# Patient Record
Sex: Female | Born: 1960 | Race: Black or African American | Hispanic: No | Marital: Married | Smoking: Current every day smoker
Health system: Southern US, Community
[De-identification: ages and names within clinical notes are randomized; demographics above are authoritative.]

## PROBLEM LIST (undated history)

## (undated) DIAGNOSIS — M545 Low back pain, unspecified: Secondary | ICD-10-CM

## (undated) DIAGNOSIS — R569 Unspecified convulsions: Secondary | ICD-10-CM

## (undated) DIAGNOSIS — F319 Bipolar disorder, unspecified: Secondary | ICD-10-CM

## (undated) DIAGNOSIS — M51369 Other intervertebral disc degeneration, lumbar region without mention of lumbar back pain or lower extremity pain: Secondary | ICD-10-CM

## (undated) DIAGNOSIS — G8384 Todd's paralysis (postepileptic): Secondary | ICD-10-CM

## (undated) DIAGNOSIS — R519 Headache, unspecified: Secondary | ICD-10-CM

## (undated) DIAGNOSIS — R262 Difficulty in walking, not elsewhere classified: Secondary | ICD-10-CM

## (undated) DIAGNOSIS — M5136 Other intervertebral disc degeneration, lumbar region: Secondary | ICD-10-CM

## (undated) DIAGNOSIS — F329 Major depressive disorder, single episode, unspecified: Secondary | ICD-10-CM

## (undated) DIAGNOSIS — R51 Headache: Secondary | ICD-10-CM

## (undated) DIAGNOSIS — I10 Essential (primary) hypertension: Secondary | ICD-10-CM

## (undated) DIAGNOSIS — F419 Anxiety disorder, unspecified: Secondary | ICD-10-CM

## (undated) DIAGNOSIS — G8929 Other chronic pain: Secondary | ICD-10-CM

## (undated) DIAGNOSIS — F32A Depression, unspecified: Secondary | ICD-10-CM

## (undated) HISTORY — DX: Anxiety disorder, unspecified: F41.9

## (undated) HISTORY — DX: Essential (primary) hypertension: I10

## (undated) HISTORY — DX: Other intervertebral disc degeneration, lumbar region: M51.36

## (undated) HISTORY — PX: TONSILLECTOMY: SUR1361

## (undated) HISTORY — DX: Difficulty in walking, not elsewhere classified: R26.2

## (undated) HISTORY — DX: Depression, unspecified: F32.A

## (undated) HISTORY — DX: Other intervertebral disc degeneration, lumbar region without mention of lumbar back pain or lower extremity pain: M51.369

## (undated) HISTORY — DX: Major depressive disorder, single episode, unspecified: F32.9

---

## 2012-07-23 ENCOUNTER — Ambulatory Visit (HOSPITAL_COMMUNITY)
Admission: RE | Admit: 2012-07-23 | Discharge: 2012-07-23 | Disposition: A | Discharge status: Home / Self Care | Payer: Disability Insurance | Source: Ambulatory Visit | Attending: Physical Medicine and Rehabilitation | Admitting: Physical Medicine and Rehabilitation

## 2012-07-23 ENCOUNTER — Other Ambulatory Visit (HOSPITAL_COMMUNITY): Payer: Self-pay | Admitting: *Deleted

## 2012-07-23 DIAGNOSIS — M545 Low back pain, unspecified: Secondary | ICD-10-CM | POA: Insufficient documentation

## 2015-12-07 ENCOUNTER — Other Ambulatory Visit: Payer: Self-pay | Admitting: *Deleted

## 2015-12-07 NOTE — Telephone Encounter (Signed)
Angel pt - xanax - have nurse phone to pharm [facility]

## 2015-12-08 MED ORDER — ALPRAZOLAM 0.5 MG PO TABS: 0.5000 mg | ORAL_TABLET | Freq: Two times a day (BID) | ORAL | 1 refills | Status: DC | PRN

## 2015-12-08 NOTE — Telephone Encounter (Signed)
rx called into pharmacy

## 2016-01-08 ENCOUNTER — Other Ambulatory Visit: Payer: Self-pay | Admitting: *Deleted

## 2016-02-01 ENCOUNTER — Other Ambulatory Visit: Payer: Self-pay | Admitting: *Deleted

## 2016-02-02 MED ORDER — ALPRAZOLAM 0.5 MG PO TABS: 0.5000 mg | ORAL_TABLET | Freq: Two times a day (BID) | ORAL | 0 refills | Status: DC | PRN

## 2016-02-20 ENCOUNTER — Telehealth: Payer: Self-pay | Admitting: Physician Assistant

## 2016-02-21 ENCOUNTER — Encounter: Payer: Self-pay | Admitting: Physician Assistant

## 2016-02-21 ENCOUNTER — Encounter (INDEPENDENT_AMBULATORY_CARE_PROVIDER_SITE_OTHER): Payer: Self-pay

## 2016-02-21 ENCOUNTER — Ambulatory Visit (INDEPENDENT_AMBULATORY_CARE_PROVIDER_SITE_OTHER): Payer: Medicare Other | Admitting: Physician Assistant

## 2016-02-21 VITALS — BP 131/85 | HR 103 | Temp 97.4°F | Ht 66.0 in | Wt 108.0 lb

## 2016-02-21 DIAGNOSIS — M5136 Other intervertebral disc degeneration, lumbar region: Secondary | ICD-10-CM | POA: Diagnosis not present

## 2016-02-21 DIAGNOSIS — F331 Major depressive disorder, recurrent, moderate: Secondary | ICD-10-CM | POA: Diagnosis not present

## 2016-02-21 DIAGNOSIS — I10 Essential (primary) hypertension: Secondary | ICD-10-CM | POA: Diagnosis not present

## 2016-02-21 MED ORDER — HYDROCODONE-ACETAMINOPHEN 10-325 MG PO TABS: 1.0000 | ORAL_TABLET | Freq: Four times a day (QID) | ORAL | 0 refills | Status: DC | PRN

## 2016-02-21 MED ORDER — VENLAFAXINE HCL ER 75 MG PO CP24: 75.0000 mg | ORAL_CAPSULE | Freq: Every day | ORAL | 2 refills | Status: DC

## 2016-02-21 NOTE — Progress Notes (Signed)
BP 131/85   Pulse (!) 103   Temp 97.4 F (36.3 C) (Oral)   Ht 5\' 6"  (1.676 m)   Wt 108 lb (49 kg)   BMI 17.43 kg/m    Subjective:    Patient ID: Betty Nelson, female    DOB: 05-02-1960, 55 y.o.   MRN: [id number]  Betty Nelson is a 55 y.o. female presenting on 02/21/2016 for Follow-up and Medication Refill  HPI Patient here to be established as new patient at [facility].  This patient is known to me from [facility]. This patient comes in for periodic recheck on medications and conditions. All medications are reviewed today. There are no reports of any problems with the medications. All of the medical conditions are reviewed and updated.  Lab work is reviewed and will be ordered as medically necessary. She has established permanent disability from her severe degenerative disc disease, osteoarthritis, depression and anxiety. She is trying to reduce her alcohol. At the most she will have 3 cans of beer in a day but usually tries to do less. She does not feel that the citalopram is helping her depression and anxiety anymore. She has taken this medicine for a very long time. She states that she has taken Prozac, Zoloft, Paxil, Lexapro in the past. She has not taken Effexor. We will switch her over at this time to this new medication. It will be sent to her pharmacy. We will also have her reduce her anxiety medicine to at most 2 per day of the Xanax 0.5 mg. She understands that we need to reduce this in order to have her continue her pain medication. She has a very difficult time getting transportation and getting to other locations. She lives locally here in [place].  Indication for chronic opioid: DDD lumbar Medication and dose: percocet 10/325 1-2 tab QID for severe pain # pills per month: 240 Last UDS date: 6 months Pain contract signed (Y/N): No, will perform in 3 months at next check Date narcotic database last reviewed (include red flags):  no   Past Medical History:  Diagnosis Date  . Anxiety   . DDD (degenerative disc disease), lumbar   . DDD (degenerative disc disease), lumbar   . Depression   . Disability of walking   . Hypertension     Relevant past medical, surgical, family and social history reviewed and updated as indicated. Interim medical history since our last visit reviewed. Allergies and medications reviewed and updated.   Data reviewed from any sources in EPIC.  Review of Systems  Constitutional: Positive for fatigue. Negative for activity change and fever.  HENT: Negative.   Eyes: Negative.   Respiratory: Negative.  Negative for cough and wheezing.   Cardiovascular: Negative.  Negative for chest pain and leg swelling.  Gastrointestinal: Negative.  Negative for abdominal pain.  Endocrine: Negative.   Genitourinary: Negative.  Negative for dysuria.  Musculoskeletal: Positive for arthralgias, back pain, gait problem and myalgias.  Skin: Negative.   Neurological: Positive for headaches.  Psychiatric/Behavioral: Positive for decreased concentration, dysphoric mood and sleep disturbance. Negative for hallucinations and suicidal ideas. The patient is nervous/anxious. The patient is not hyperactive.      Social History   Social History  . Marital status: Married    Spouse name: N/A  . Number of children: N/A  . Years of education: N/A   Occupational History  . Not on file.   Social History Main Topics  . Smoking  status: Current Every Day Smoker  . Smokeless tobacco: Never Used  . Alcohol use 1.8 - 3.6 oz/week    3 - 6 Cans of beer per week  . Drug use: No  . Sexual activity: Not on file   Other Topics Concern  . Not on file   Social History Narrative  . No narrative on file    History reviewed. No pertinent surgical history.  History reviewed. No pertinent family history.    Medication List       Accurate as of 02/21/16 12:07 PM. Always use your most recent med list.           ALPRAZolam 0.5 MG tablet Commonly known as:  XANAX Take 1 tablet (0.5 mg total) by mouth 2 (two) times daily as needed for anxiety. Work on lessening this dose, only use 2 daily at most.   HYDROcodone-acetaminophen 10-325 MG tablet Commonly known as:  NORCO Take 1-2 tablets by mouth every 6 (six) hours as needed.   HYDROcodone-acetaminophen 10-325 MG tablet Commonly known as:  NORCO Take 1 tablet by mouth every 6 (six) hours as needed.   HYDROcodone-acetaminophen 10-325 MG tablet Commonly known as:  NORCO Take 1 tablet by mouth every 6 (six) hours as needed.   lisinopril 20 MG tablet Commonly known as:  PRINIVIL,ZESTRIL Take 10 mg by mouth daily.   megestrol 40 MG tablet Commonly known as:  MEGACE Take 40 mg by mouth 2 (two) times daily.   venlafaxine XR 75 MG 24 hr capsule Commonly known as:  EFFEXOR XR Take 1-2 capsules (75-150 mg total) by mouth daily with breakfast. In one week increase to 2 tablets QAM          Objective:    BP 131/85   Pulse (!) 103   Temp 97.4 F (36.3 C) (Oral)   Ht 5\' 6"  (1.676 m)   Wt 108 lb (49 kg)   BMI 17.43 kg/m   Not on File Wt Readings from Last 3 Encounters:  02/21/16 108 lb (49 kg)    Physical Exam  Constitutional: She is oriented to person, place, and time. She appears well-developed and well-nourished.  HENT:  Head: Normocephalic and atraumatic.  Eyes: Conjunctivae and EOM are normal. Pupils are equal, round, and reactive to light.  Cardiovascular: Normal rate, regular rhythm, normal heart sounds and intact distal pulses.   Pulmonary/Chest: Effort normal and breath sounds normal.  Abdominal: Soft. Bowel sounds are normal.  Neurological: She is alert and oriented to person, place, and time. She has normal reflexes.  Skin: Skin is warm and dry. No rash noted.  Psychiatric: She has a normal mood and affect. Her behavior is normal. Judgment and thought content normal.  Nursing note and vitals reviewed.      Assessment &  Plan:   1. DDD (degenerative disc disease), lumbar - HYDROcodone-acetaminophen (NORCO) 10-325 MG tablet; Take 1-2 tablets by mouth every 6 (six) hours as needed.  Dispense: 120 tablet; Refill: 0 - HYDROcodone-acetaminophen (NORCO) 10-325 MG tablet; Take 1 tablet by mouth every 6 (six) hours as needed.  Dispense: 120 tablet; Refill: 0 - HYDROcodone-acetaminophen (NORCO) 10-325 MG tablet; Take 1 tablet by mouth every 6 (six) hours as needed.  Dispense: 120 tablet; Refill: 0  2. Essential hypertension  3. Moderate episode of recurrent major depressive disorder (HCC) - venlafaxine XR (EFFEXOR XR) 75 MG 24 hr capsule; Take 1-2 capsules (75-150 mg total) by mouth daily with breakfast. In one week increase to 2 tablets QAM  Dispense: 60 capsule; Refill: 2   Continue all other maintenance medications as listed above. Educational handout given for anxiety  Follow up plan: Return in about 3 months (around 05/23/2016) for recheck medication/PAIN.  Remus LofflerAngel S. Dailyn Reith PA-C [facility] [address]  [place], [place] [address] [phone]   02/21/2016, 12:07 PM

## 2016-02-21 NOTE — Patient Instructions (Signed)
Generalized Anxiety Disorder Generalized anxiety disorder (GAD) is a mental disorder. It interferes with life functions, including relationships, work, and school. GAD is different from normal anxiety, which everyone experiences at some point in their lives in response to specific life events and activities. Normal anxiety actually helps us prepare for and get through these life events and activities. Normal anxiety goes away after the event or activity is over.  GAD causes anxiety that is not necessarily related to specific events or activities. It also causes excess anxiety in proportion to specific events or activities. The anxiety associated with GAD is also difficult to control. GAD can vary from mild to severe. People with severe GAD can have intense waves of anxiety with physical symptoms (panic attacks).  SYMPTOMS The anxiety and worry associated with GAD are difficult to control. This anxiety and worry are related to many life events and activities and also occur more days than not for 6 months or longer. People with GAD also have three or more of the following symptoms (one or more in children):  Restlessness.   Fatigue.  Difficulty concentrating.   Irritability.  Muscle tension.  Difficulty sleeping or unsatisfying sleep. DIAGNOSIS GAD is diagnosed through an assessment by your health care provider. Your health care provider will ask you questions aboutyour mood,physical symptoms, and events in your life. Your health care provider may ask you about your medical history and use of alcohol or drugs, including prescription medicines. Your health care provider may also do a physical exam and blood tests. Certain medical conditions and the use of certain substances can cause symptoms similar to those associated with GAD. Your health care provider may refer you to a mental health specialist for further evaluation. TREATMENT The following therapies are usually used to treat GAD:    Medication. Antidepressant medication usually is prescribed for long-term daily control. Antianxiety medicines may be added in severe cases, especially when panic attacks occur.   Talk therapy (psychotherapy). Certain types of talk therapy can be helpful in treating GAD by providing support, education, and guidance. A form of talk therapy called cognitive behavioral therapy can teach you healthy ways to think about and react to daily life events and activities.  Stress managementtechniques. These include yoga, meditation, and exercise and can be very helpful when they are practiced regularly. A mental health specialist can help determine which treatment is best for you. Some people see improvement with one therapy. However, other people require a combination of therapies. This information is not intended to replace advice given to you by your health care provider. Make sure you discuss any questions you have with your health care provider. Document Released: 07/20/2012 Document Revised: 04/15/2014 Document Reviewed: 07/20/2012 Elsevier Interactive Patient Education  2017 Elsevier Inc.  

## 2016-03-21 ENCOUNTER — Other Ambulatory Visit: Payer: Self-pay | Admitting: *Deleted

## 2016-03-21 MED ORDER — ALPRAZOLAM 0.5 MG PO TABS: 0.5000 mg | ORAL_TABLET | Freq: Two times a day (BID) | ORAL | 2 refills | Status: DC | PRN

## 2016-03-21 MED ORDER — LISINOPRIL 20 MG PO TABS: 10.0000 mg | ORAL_TABLET | Freq: Every day | ORAL | 2 refills | Status: DC

## 2016-03-21 NOTE — Telephone Encounter (Signed)
Last filled 02/14/2016. If approved please route to pool and have nurse call into pharmacy

## 2016-03-22 ENCOUNTER — Telehealth: Payer: Self-pay | Admitting: Physician Assistant

## 2016-03-22 NOTE — Telephone Encounter (Signed)
Rx called in to pharmacy. 

## 2016-04-20 ENCOUNTER — Other Ambulatory Visit: Payer: Self-pay | Admitting: Physician Assistant

## 2016-04-20 DIAGNOSIS — F331 Major depressive disorder, recurrent, moderate: Secondary | ICD-10-CM

## 2016-05-20 ENCOUNTER — Ambulatory Visit (INDEPENDENT_AMBULATORY_CARE_PROVIDER_SITE_OTHER): Payer: Medicare Other | Admitting: Physician Assistant

## 2016-05-20 ENCOUNTER — Encounter: Payer: Self-pay | Admitting: Physician Assistant

## 2016-05-20 ENCOUNTER — Other Ambulatory Visit: Payer: Self-pay | Admitting: Physician Assistant

## 2016-05-20 VITALS — BP 151/83 | HR 109 | Temp 98.6°F | Ht 66.0 in | Wt 114.0 lb

## 2016-05-20 DIAGNOSIS — M5136 Other intervertebral disc degeneration, lumbar region: Secondary | ICD-10-CM | POA: Diagnosis not present

## 2016-05-20 DIAGNOSIS — F331 Major depressive disorder, recurrent, moderate: Secondary | ICD-10-CM | POA: Insufficient documentation

## 2016-05-20 DIAGNOSIS — I10 Essential (primary) hypertension: Secondary | ICD-10-CM | POA: Insufficient documentation

## 2016-05-20 MED ORDER — HYDROCODONE-ACETAMINOPHEN 10-325 MG PO TABS: 1.0000 | ORAL_TABLET | Freq: Four times a day (QID) | ORAL | 0 refills | Status: DC | PRN

## 2016-05-20 MED ORDER — ALPRAZOLAM 0.5 MG PO TABS
0.5000 mg | ORAL_TABLET | Freq: Two times a day (BID) | ORAL | 2 refills | Status: DC | PRN
Start: 2016-05-20 — End: 2016-08-20

## 2016-05-20 MED ORDER — LISINOPRIL 20 MG PO TABS: 10.0000 mg | ORAL_TABLET | Freq: Every day | ORAL | 3 refills | Status: DC

## 2016-05-20 MED ORDER — VENLAFAXINE HCL ER 150 MG PO CP24: 150.0000 mg | ORAL_CAPSULE | Freq: Every day | ORAL | 3 refills | Status: DC

## 2016-05-20 NOTE — Patient Instructions (Signed)
Major Depressive Disorder, Adult Major depressive disorder (MDD) is a mental health condition. It may also be called clinical depression or unipolar depression. MDD usually causes feelings of sadness, hopelessness, or helplessness. MDD can also cause physical symptoms. It can interfere with work, school, relationships, and other everyday activities. MDD may be mild, moderate, or severe. It may occur once (single episode major depressive disorder) or it may occur multiple times (recurrent major depressive disorder). What are the causes? The exact cause of this condition is not known. MDD is most likely caused by a combination of things, which may include:  Genetic factors. These are traits that are passed along from parent to child.  Individual factors. Your personality, your behavior, and the way you handle your thoughts and feelings may contribute to MDD. This includes personality traits and behaviors learned from others.  Physical factors, such as: ? Differences in the part of your brain that controls emotion. This part of your brain may be different than it is in people who do not have MDD. ? Long-term (chronic) medical or psychiatric illnesses.  Social factors. Traumatic experiences or major life changes may play a role in the development of MDD.  What increases the risk? This condition is more likely to develop in women. The following factors may also make you more likely to develop MDD:  A family history of depression.  Troubled family relationships.  Abnormally low levels of certain brain chemicals.  Traumatic events in childhood, especially abuse or the loss of a parent.  Being under a lot of stress, or long-term stress, especially from upsetting life experiences or losses.  A history of: ? Chronic physical illness. ? Other mental health disorders. ? Substance abuse.  Poor living conditions.  Experiencing social exclusion or discrimination on a regular basis.  What are  the signs or symptoms? The main symptoms of MDD typically include:  Constant depressed or irritable mood.  Loss of interest in things and activities.  MDD symptoms may also include:  Sleeping or eating too much or too little.  Unexplained weight change.  Fatigue or low energy.  Feelings of worthlessness or guilt.  Difficulty thinking clearly or making decisions.  Thoughts of suicide or of harming others.  Physical agitation or weakness.  Isolation.  Severe cases of MDD may also occur with other symptoms, such as:  Delusions or hallucinations, in which you imagine things that are not real (psychotic depression).  Low-level depression that lasts at least a year (chronic depression or persistent depressive disorder).  Extreme sadness and hopelessness (melancholic depression).  Trouble speaking and moving (catatonic depression).  How is this diagnosed? This condition may be diagnosed based on:  Your symptoms.  Your medical history, including your mental health history. This may involve tests to evaluate your mental health. You may be asked questions about your lifestyle, including any drug and alcohol use, and how long you have had symptoms of MDD.  A physical exam.  Blood tests to rule out other conditions.  You must have a depressed mood and at least four other MDD symptoms most of the day, nearly every day in the same 2-week timeframe before your health care provider can confirm a diagnosis of MDD. How is this treated? This condition is usually treated by mental health professionals, such as psychologists, psychiatrists, and clinical social workers. You may need more than one type of treatment. Treatment may include:  Psychotherapy. This is also called talk therapy or counseling. Types of psychotherapy include: ? Cognitive behavioral   therapy (CBT). This type of therapy teaches you to recognize unhealthy feelings, thoughts, and behaviors, and replace them with  positive thoughts and actions. ? Interpersonal therapy (IPT). This helps you to improve the way you relate to and communicate with others. ? Family therapy. This treatment includes members of your family.  Medicine to treat anxiety and depression, or to help you control certain emotions and behaviors.  Lifestyle changes, such as: ? Limiting alcohol and drug use. ? Exercising regularly. ? Getting plenty of sleep. ? Making healthy eating choices. ? Spending more time outdoors.  Treatments involving stimulation of the brain can be used in situations with extremely severe symptoms, or when medicine or other therapies do not work over time. These treatments include electroconvulsive therapy, transcranial magnetic stimulation, and vagal nerve stimulation. Follow these instructions at home: Activity  Return to your normal activities as told by your health care provider.  Exercise regularly and spend time outdoors as told by your health care provider. General instructions  Take over-the-counter and prescription medicines only as told by your health care provider.  Do not drink alcohol. If you drink alcohol, limit your alcohol intake to no more than 1 drink a day for nonpregnant women and 2 drinks a day for men. One drink equals 12 oz of beer, 5 oz of wine, or 1 oz of hard liquor. Alcohol can affect any antidepressant medicines you are taking. Talk to your health care provider about your alcohol use.  Eat a healthy diet and get plenty of sleep.  Find activities that you enjoy doing, and make time to do them.  Consider joining a support group. Your health care provider may be able to recommend a support group.  Keep all follow-up visits as told by your health care provider. This is important. Where to find more information: National Alliance on Mental Illness  www.nami.org  U.S. National Institute of Mental Health  www.nimh.nih.gov  National Suicide Prevention  Lifeline  1-800-273-TALK (8255). This is free, 24-hour help.  Contact a health care provider if:  Your symptoms get worse.  You develop new symptoms. Get help right away if:  You self-harm.  You have serious thoughts about hurting yourself or others.  You see, hear, taste, smell, or feel things that are not present (hallucinate). This information is not intended to replace advice given to you by your health care provider. Make sure you discuss any questions you have with your health care provider. Document Released: 07/20/2012 Document Revised: 11/30/2015 Document Reviewed: 10/04/2015 Elsevier Interactive Patient Education  2017 Elsevier Inc.  

## 2016-05-20 NOTE — Progress Notes (Signed)
BP (!) 151/83   Pulse (!) 109   Temp 98.6 F (37 C) (Oral)   Ht 5\' 6"  (1.676 m)   Wt 114 lb (51.7 kg)   BMI 18.40 kg/m    Subjective:    Patient ID: Betty Nelson, female    DOB: [birth year], 58 y.o.   MRN: [id number]  HPI: Betty Nelson is a 56 y.o. female presenting on 05/20/2016 for Pain medication refill  This patient comes in for periodic recheck on medications and conditions. All medications are reviewed today. There are no reports of any problems with the medications. All of the medical conditions are reviewed and updated.  Lab work is reviewed and will be ordered as medically necessary. There are no new problems reported with today's visit.   Past Medical History:  Diagnosis Date  . Anxiety   . DDD (degenerative disc disease), lumbar   . DDD (degenerative disc disease), lumbar   . Depression   . Disability of walking   . Hypertension    Relevant past medical, surgical, family and social history reviewed and updated as indicated. Interim medical history since our last visit reviewed. Allergies and medications reviewed and updated. DATA REVIEWED: CHART IN EPIC  Social History   Social History  . Marital status: Married    Spouse name: N/A  . Number of children: N/A  . Years of education: N/A   Occupational History  . Not on file.   Social History Main Topics  . Smoking status: Current Every Day Smoker  . Smokeless tobacco: Never Used  . Alcohol use 1.8 - 3.6 oz/week    3 - 6 Cans of beer per week  . Drug use: No  . Sexual activity: Not on file   Other Topics Concern  . Not on file   Social History Narrative  . No narrative on file    History reviewed. No pertinent surgical history.  Family History  Problem Relation Age of Onset  . Heart disease Mother   . Hyperlipidemia Mother   . Heart disease Father   . Hyperlipidemia Father     Review of Systems  Constitutional: Negative.  Negative for activity change, fatigue and fever.  HENT:  Negative.   Eyes: Negative.   Respiratory: Negative.  Negative for cough.   Cardiovascular: Negative.  Negative for chest pain.  Gastrointestinal: Negative.  Negative for abdominal pain.  Endocrine: Negative.   Genitourinary: Negative.  Negative for dysuria.  Musculoskeletal: Positive for arthralgias, back pain and gait problem.  Skin: Negative.   Psychiatric/Behavioral: Positive for decreased concentration and dysphoric mood. The patient is nervous/anxious.     Allergies as of 05/20/2016   No Known Allergies     Medication List       Accurate as of 05/20/16 12:46 PM. Always use your most recent med list.          ALPRAZolam 0.5 MG tablet Commonly known as:  XANAX Take 1 tablet (0.5 mg total) by mouth 2 (two) times daily as needed for anxiety.   HYDROcodone-acetaminophen 10-325 MG tablet Commonly known as:  NORCO Take 1-2 tablets by mouth every 6 (six) hours as needed.   HYDROcodone-acetaminophen 10-325 MG tablet Commonly known as:  NORCO Take 1-2 tablets by mouth every 6 (six) hours as needed.   HYDROcodone-acetaminophen 10-325 MG tablet Commonly known as:  NORCO Take 1-2 tablets by mouth every 6 (six) hours as needed.   lisinopril 20 MG tablet Commonly known as:  PRINIVIL,ZESTRIL Take 0.5  tablets (10 mg total) by mouth daily.   megestrol 40 MG tablet Commonly known as:  MEGACE Take 40 mg by mouth 2 (two) times daily.   venlafaxine XR 150 MG 24 hr capsule Commonly known as:  EFFEXOR-XR Take 1 capsule (150 mg total) by mouth daily.          Objective:    BP (!) 151/83   Pulse (!) 109   Temp 98.6 F (37 C) (Oral)   Ht 5\' 6"  (1.676 m)   Wt 114 lb (51.7 kg)   BMI 18.40 kg/m   No Known Allergies  Wt Readings from Last 3 Encounters:  05/20/16 114 lb (51.7 kg)  02/21/16 108 lb (49 kg)    Physical Exam  Constitutional: She is oriented to person, place, and time. She appears well-developed and well-nourished.  HENT:  Head: Normocephalic and atraumatic.   Eyes: Conjunctivae and EOM are normal. Pupils are equal, round, and reactive to light.  Cardiovascular: Normal rate, regular rhythm, normal heart sounds and intact distal pulses.   Pulmonary/Chest: Effort normal and breath sounds normal.  Abdominal: Soft. Bowel sounds are normal.  Musculoskeletal:       Lumbar back: She exhibits decreased range of motion, tenderness, pain and spasm.  Neurological: She is alert and oriented to person, place, and time. She has normal reflexes.  Skin: Skin is warm and dry. No rash noted.  Psychiatric: Her behavior is normal. Judgment and thought content normal. She exhibits a depressed mood.    No results found for this or any previous visit.    Assessment & Plan:   1. DDD (degenerative disc disease), lumbar - HYDROcodone-acetaminophen (NORCO) 10-325 MG tablet; Take 1-2 tablets by mouth every 6 (six) hours as needed.  Dispense: 240 tablet; Refill: 0 - HYDROcodone-acetaminophen (NORCO) 10-325 MG tablet; Take 1-2 tablets by mouth every 6 (six) hours as needed.  Dispense: 240 tablet; Refill: 0 - HYDROcodone-acetaminophen (NORCO) 10-325 MG tablet; Take 1-2 tablets by mouth every 6 (six) hours as needed.  Dispense: 240 tablet; Refill: 0  2. Moderate episode of recurrent major depressive disorder (HCC) - ALPRAZolam (XANAX) 0.5 MG tablet; Take 1 tablet (0.5 mg total) by mouth 2 (two) times daily as needed for anxiety.  Dispense: 60 tablet; Refill: 2 - venlafaxine XR (EFFEXOR-XR) 150 MG 24 hr capsule; Take 1 capsule (150 mg total) by mouth daily.  Dispense: 90 capsule; Refill: 3  3. Essential hypertension - lisinopril (PRINIVIL,ZESTRIL) 20 MG tablet; Take 0.5 tablets (10 mg total) by mouth daily.  Dispense: 90 tablet; Refill: 3   Continue all other maintenance medications as listed above.  Follow up plan: Return in about 3 months (around 08/17/2016) for recheck.  Educational handout given for depression  Remus LofflerAngel S. Gianluca Chhim PA-C [facility] [address]  [place], [place] [address] [phone]   05/20/2016, 12:46 PM

## 2016-05-21 ENCOUNTER — Other Ambulatory Visit: Payer: Self-pay | Admitting: Physician Assistant

## 2016-07-16 ENCOUNTER — Other Ambulatory Visit: Payer: Self-pay | Admitting: Physician Assistant

## 2016-08-15 ENCOUNTER — Other Ambulatory Visit: Payer: Self-pay | Admitting: Physician Assistant

## 2016-08-15 DIAGNOSIS — F331 Major depressive disorder, recurrent, moderate: Secondary | ICD-10-CM

## 2016-08-16 ENCOUNTER — Ambulatory Visit: Payer: Medicare Other | Admitting: Physician Assistant

## 2016-08-19 ENCOUNTER — Encounter: Payer: Self-pay | Admitting: Physician Assistant

## 2016-08-20 ENCOUNTER — Ambulatory Visit (INDEPENDENT_AMBULATORY_CARE_PROVIDER_SITE_OTHER): Payer: Medicare Other | Admitting: Physician Assistant

## 2016-08-20 ENCOUNTER — Encounter: Payer: Self-pay | Admitting: Physician Assistant

## 2016-08-20 ENCOUNTER — Ambulatory Visit: Payer: Medicare Other | Admitting: Physician Assistant

## 2016-08-20 DIAGNOSIS — M5136 Other intervertebral disc degeneration, lumbar region: Secondary | ICD-10-CM | POA: Diagnosis not present

## 2016-08-20 DIAGNOSIS — I10 Essential (primary) hypertension: Secondary | ICD-10-CM | POA: Diagnosis not present

## 2016-08-20 DIAGNOSIS — F331 Major depressive disorder, recurrent, moderate: Secondary | ICD-10-CM

## 2016-08-20 MED ORDER — HYDROCODONE-ACETAMINOPHEN 10-325 MG PO TABS: 1.0000 | ORAL_TABLET | Freq: Four times a day (QID) | ORAL | 0 refills | Status: DC | PRN

## 2016-08-20 MED ORDER — LISINOPRIL 20 MG PO TABS: 20.0000 mg | ORAL_TABLET | Freq: Every day | ORAL | 3 refills | Status: DC

## 2016-08-20 MED ORDER — ALPRAZOLAM 0.5 MG PO TABS: 0.5000 mg | ORAL_TABLET | Freq: Two times a day (BID) | ORAL | 2 refills | Status: DC | PRN

## 2016-08-20 NOTE — Patient Instructions (Signed)
Sciatica Sciatica is pain, numbness, weakness, or tingling along your sciatic nerve. The sciatic nerve starts in the lower back and goes down the back of each leg. Sciatica happens when this nerve is pinched or has pressure put on it. Sciatica usually goes away on its own or with treatment. Sometimes, sciatica may keep coming back (recur). Follow these instructions at home: Medicines   Take over-the-counter and prescription medicines only as told by your doctor.  Do not drive or use heavy machinery while taking prescription pain medicine. Managing pain   If directed, put ice on the affected area.  Put ice in a plastic bag.  Place a towel between your skin and the bag.  Leave the ice on for 20 minutes, 2-3 times a day.  After icing, apply heat to the affected area before you exercise or as often as told by your doctor. Use the heat source that your doctor tells you to use, such as a moist heat pack or a heating pad.  Place a towel between your skin and the heat source.  Leave the heat on for 20-30 minutes.  Remove the heat if your skin turns bright red. This is especially important if you are unable to feel pain, heat, or cold. You may have a greater risk of getting burned. Activity   Return to your normal activities as told by your doctor. Ask your doctor what activities are safe for you.  Avoid activities that make your sciatica worse.  Take short rests during the day. Rest in a lying or standing position. This is usually better than sitting to rest.  When you rest for a long time, do some physical activity or stretching between periods of rest.  Avoid sitting for a long time without moving. Get up and move around at least one time each hour.  Exercise and stretch regularly, as told by your doctor.  Do not lift anything that is heavier than 10 lb (4.5 kg) while you have symptoms of sciatica.  Avoid lifting heavy things even when you do not have symptoms.  Avoid lifting  heavy things over and over.  When you lift objects, always lift in a way that is safe for your body. To do this, you should:  Bend your knees.  Keep the object close to your body.  Avoid twisting. General instructions   Use good posture.  Avoid leaning forward when you are sitting.  Avoid hunching over when you are standing.  Stay at a healthy weight.  Wear comfortable shoes that support your feet. Avoid wearing high heels.  Avoid sleeping on a mattress that is too soft or too hard. You might have less pain if you sleep on a mattress that is firm enough to support your back.  Keep all follow-up visits as told by your doctor. This is important. Contact a doctor if:  You have pain that:  Wakes you up when you are sleeping.  Gets worse when you lie down.  Is worse than the pain you have had in the past.  Lasts longer than 4 weeks.  You lose weight for without trying. Get help right away if:  You cannot control when you pee (urinate) or poop (have a bowel movement).  You have weakness in any of these areas and it gets worse.  Lower back.  Lower belly (pelvis).  Butt (buttocks).  Legs.  You have redness or swelling of your back.  You have a burning feeling when you pee. This information is   not intended to replace advice given to you by your health care provider. Make sure you discuss any questions you have with your health care provider. Document Released: 01/02/2008 Document Revised: 08/31/2015 Document Reviewed: 12/02/2014 Elsevier Interactive Patient Education  2017 Elsevier Inc.  

## 2016-08-20 NOTE — Progress Notes (Signed)
BP 140/79   Pulse 98   Temp 98.4 F (36.9 C) (Oral)   Ht 5\' 6"  (1.676 m)   Wt 114 lb 3.2 oz (51.8 kg)   BMI 18.43 kg/m    Subjective:    Patient ID: Betty Nelson, female    DOB: [birth year], 56 y.o.   MRN: [id number]  HPI: Betty Nelson is a 56 y.o. female presenting on 08/20/2016 for Medication Refill  This patient comes in for periodic recheck on medications and conditions including DDD, back pain, sciatica, anxiety, permanent disability. She has severe back pain that radiates down left leg and has her doing hardly any activity.  She is trying to use the alprazolam as liilt as possible.  All medications are reviewed today. There are no reports of any problems with the medications. All of the medical conditions are reviewed and updated.  Lab work is reviewed and will be ordered as medically necessary. There are no new problems reported with today's visit.   Relevant past medical, surgical, family and social history reviewed and updated as indicated. Allergies and medications reviewed and updated.  Past Medical History:  Diagnosis Date  . Anxiety   . DDD (degenerative disc disease), lumbar   . DDD (degenerative disc disease), lumbar   . Depression   . Disability of walking   . Hypertension     History reviewed. No pertinent surgical history.  Review of Systems  Allergies as of 08/20/2016   No Known Allergies     Medication List       Accurate as of 08/20/16  4:03 PM. Always use your most recent med list.          ALPRAZolam 0.5 MG tablet Commonly known as:  XANAX Take 1 tablet (0.5 mg total) by mouth 2 (two) times daily as needed for anxiety.   HYDROcodone-acetaminophen 10-325 MG tablet Commonly known as:  NORCO Take 1-2 tablets by mouth every 6 (six) hours as needed.   HYDROcodone-acetaminophen 10-325 MG tablet Commonly known as:  NORCO Take 1-2 tablets by mouth every 6 (six) hours as needed.   HYDROcodone-acetaminophen 10-325 MG tablet Commonly known  as:  NORCO Take 1-2 tablets by mouth every 6 (six) hours as needed.   lisinopril 20 MG tablet Commonly known as:  PRINIVIL,ZESTRIL Take 0.5 tablets (10 mg total) by mouth daily.   megestrol 40 MG tablet Commonly known as:  MEGACE Take 1 Tablet by mouth 2 times a day   venlafaxine XR 150 MG 24 hr capsule Commonly known as:  EFFEXOR-XR Take 1 capsule (150 mg total) by mouth daily.          Objective:    BP 140/79   Pulse 98   Temp 98.4 F (36.9 C) (Oral)   Ht 5\' 6"  (1.676 m)   Wt 114 lb 3.2 oz (51.8 kg)   BMI 18.43 kg/m   No Known Allergies  Physical Exam  Constitutional: She is oriented to person, place, and time. She appears well-developed and well-nourished.  HENT:  Head: Normocephalic and atraumatic.  Eyes: Conjunctivae and EOM are normal. Pupils are equal, round, and reactive to light.  Cardiovascular: Normal rate, regular rhythm, normal heart sounds and intact distal pulses.   Pulmonary/Chest: Effort normal and breath sounds normal.  Abdominal: Soft. Bowel sounds are normal.  Musculoskeletal:       Lumbar back: She exhibits decreased range of motion, tenderness, pain and spasm.  Neurological: She is alert and oriented to person, place, and time.  She has normal reflexes.  Skin: Skin is warm and dry. No rash noted.  Psychiatric: She has a normal mood and affect. Her behavior is normal. Judgment and thought content normal.    No results found for this or any previous visit.    Assessment & Plan:   1. DDD (degenerative disc disease), lumbar - HYDROcodone-acetaminophen (NORCO) 10-325 MG tablet; Take 1-2 tablets by mouth every 6 (six) hours as needed.  Dispense: 240 tablet; Refill: 0 - HYDROcodone-acetaminophen (NORCO) 10-325 MG tablet; Take 1-2 tablets by mouth every 6 (six) hours as needed.  Dispense: 240 tablet; Refill: 0 - HYDROcodone-acetaminophen (NORCO) 10-325 MG tablet; Take 1-2 tablets by mouth every 6 (six) hours as needed.  Dispense: 240 tablet; Refill:  0  2. Moderate episode of recurrent major depressive disorder (HCC) - ALPRAZolam (XANAX) 0.5 MG tablet; Take 1 tablet (0.5 mg total) by mouth 2 (two) times daily as needed for anxiety.  Dispense: 60 tablet; Refill: 2   Current Outpatient Prescriptions:  .  ALPRAZolam (XANAX) 0.5 MG tablet, Take 1 tablet (0.5 mg total) by mouth 2 (two) times daily as needed for anxiety., Disp: 60 tablet, Rfl: 2 .  HYDROcodone-acetaminophen (NORCO) 10-325 MG tablet, Take 1-2 tablets by mouth every 6 (six) hours as needed., Disp: 240 tablet, Rfl: 0 .  HYDROcodone-acetaminophen (NORCO) 10-325 MG tablet, Take 1-2 tablets by mouth every 6 (six) hours as needed., Disp: 240 tablet, Rfl: 0 .  HYDROcodone-acetaminophen (NORCO) 10-325 MG tablet, Take 1-2 tablets by mouth every 6 (six) hours as needed., Disp: 240 tablet, Rfl: 0 .  lisinopril (PRINIVIL,ZESTRIL) 20 MG tablet, Take 1 tablet (20 mg total) by mouth daily., Disp: 90 tablet, Rfl: 3 .  megestrol (MEGACE) 40 MG tablet, Take 1 Tablet by mouth 2 times a day, Disp: 60 tablet, Rfl: 3 .  venlafaxine XR (EFFEXOR-XR) 150 MG 24 hr capsule, Take 1 capsule (150 mg total) by mouth daily., Disp: 90 capsule, Rfl: 3  Continue all other maintenance medications as listed above.  Follow up plan: Return in about 3 months (around 11/20/2016).  Educational handout given for sciatica  Remus Loffler PA-C [facility] [address]  [place], [place] [address] [phone]   08/20/2016, 4:03 PM

## 2016-10-18 ENCOUNTER — Telehealth: Payer: Self-pay | Admitting: Physician Assistant

## 2016-10-18 MED ORDER — CITALOPRAM HYDROBROMIDE 20 MG PO TABS: 20.0000 mg | ORAL_TABLET | Freq: Every day | ORAL | 5 refills | Status: DC

## 2016-10-18 NOTE — Telephone Encounter (Signed)
Patient aware.

## 2016-11-07 ENCOUNTER — Other Ambulatory Visit: Payer: Self-pay | Admitting: Physician Assistant

## 2016-11-07 DIAGNOSIS — F331 Major depressive disorder, recurrent, moderate: Secondary | ICD-10-CM

## 2016-11-07 NOTE — Telephone Encounter (Signed)
Go ahead and call in 1 month refill, [facility]

## 2016-11-08 NOTE — Telephone Encounter (Signed)
Phoned in.

## 2016-11-18 ENCOUNTER — Ambulatory Visit (INDEPENDENT_AMBULATORY_CARE_PROVIDER_SITE_OTHER): Payer: Medicare Other | Admitting: Physician Assistant

## 2016-11-18 ENCOUNTER — Encounter: Payer: Self-pay | Admitting: Physician Assistant

## 2016-11-18 VITALS — BP 130/79 | HR 105 | Temp 97.4°F | Ht 66.0 in | Wt 105.6 lb

## 2016-11-18 DIAGNOSIS — F331 Major depressive disorder, recurrent, moderate: Secondary | ICD-10-CM | POA: Diagnosis not present

## 2016-11-18 DIAGNOSIS — M5136 Other intervertebral disc degeneration, lumbar region: Secondary | ICD-10-CM

## 2016-11-18 DIAGNOSIS — F5101 Primary insomnia: Secondary | ICD-10-CM

## 2016-11-18 MED ORDER — ALPRAZOLAM 0.5 MG PO TABS: ORAL_TABLET | ORAL | 5 refills | Status: DC

## 2016-11-18 MED ORDER — HYDROCODONE-ACETAMINOPHEN 10-325 MG PO TABS: 1.0000 | ORAL_TABLET | Freq: Four times a day (QID) | ORAL | 0 refills | Status: DC | PRN

## 2016-11-18 MED ORDER — TRAZODONE HCL 100 MG PO TABS: 100.0000 mg | ORAL_TABLET | Freq: Every evening | ORAL | 5 refills | Status: DC | PRN

## 2016-11-18 NOTE — Progress Notes (Signed)
BP 130/79   Pulse (!) 105   Temp (!) 97.4 F (36.3 C) (Oral)   Ht 5\' 6"  (1.676 m)   Wt 105 lb 9.6 oz (47.9 kg)   BMI 17.04 kg/m    Subjective:    Patient ID: Betty Nelson, female    DOB: 1960/06/09, 56 y.o.   MRN: [id number]  HPI: Betty Nelson is a 56 y.o. female presenting on 11/18/2016 for Medication Refill and Insomnia  This patient comes in for periodic recheck on medications and conditions including Insomnia, degenerative disc disease, depression. Overall she is feeling stable. She has been taking over-the-counter medicine such as Tylenol PM and not having any benefit in getting better sleep. She has been taking her Celexa for a couple of weeks and feels good on it. She had difficulty taking Cymbalta due to side effects. Her medication is reviewed today. She is tolerating it well and not taking any more or different than she is supposed to.   [facility] 11/18/2016 08/20/2016 05/20/2016 02/21/2016  Decreased Interest 0 1 3 1   Down, Depressed, Hopeless 1 2 3 1   PHQ - 2 Score 1 3 6 2   Altered sleeping - 2 3 1   Tired, decreased energy - 3 3 3   Change in appetite - 3 3 2   Feeling bad or failure about yourself  - 1 3 2   Trouble concentrating - 1 3 1   Moving slowly or fidgety/restless - 1 3 2   Suicidal thoughts - 0 1 2  PHQ-9 Score - 14 25 15   Difficult doing work/chores - - - Somewhat difficult     All medications are reviewed today. There are no reports of any problems with the medications. All of the medical conditions are reviewed and updated.  Lab work is reviewed and will be ordered as medically necessary. There are no new problems reported with today's visit.   Relevant past medical, surgical, family and social history reviewed and updated as indicated. Allergies and medications reviewed and updated.  Past Medical History:  Diagnosis Date  . Anxiety   . DDD (degenerative disc disease), lumbar   . DDD (degenerative disc disease), lumbar   .  Depression   . Disability of walking   . Hypertension     History reviewed. No pertinent surgical history.  Review of Systems  Constitutional: Positive for fatigue. Negative for activity change and fever.  HENT: Negative.   Eyes: Negative.   Respiratory: Negative.  Negative for cough.   Cardiovascular: Negative.  Negative for chest pain.  Gastrointestinal: Negative.  Negative for abdominal pain.  Endocrine: Negative.   Genitourinary: Negative.  Negative for dysuria.  Musculoskeletal: Positive for arthralgias, back pain and gait problem.  Skin: Negative.   Psychiatric/Behavioral: Positive for dysphoric mood and sleep disturbance. The patient is nervous/anxious.     Allergies as of 11/18/2016   No Known Allergies     Medication List       Accurate as of 11/18/16 11:36 AM. Always use your most recent med list.          ALPRAZolam 0.5 MG tablet Commonly known as:  XANAX Take 1 Tablet by mouth 2 times a day as needed   citalopram 20 MG tablet Commonly known as:  CELEXA Take 1 tablet (20 mg total) by mouth daily.   HYDROcodone-acetaminophen 10-325 MG tablet Commonly known as:  NORCO Take 1-2 tablets by mouth every 6 (six) hours as needed.   HYDROcodone-acetaminophen 10-325 MG tablet Commonly known  as:  NORCO Take 1-2 tablets by mouth every 6 (six) hours as needed.   HYDROcodone-acetaminophen 10-325 MG tablet Commonly known as:  NORCO Take 1-2 tablets by mouth every 6 (six) hours as needed.   lisinopril 20 MG tablet Commonly known as:  PRINIVIL,ZESTRIL Take 1 tablet (20 mg total) by mouth daily.   megestrol 40 MG tablet Commonly known as:  MEGACE Take 1 Tablet by mouth 2 times a day   traZODone 100 MG tablet Commonly known as:  DESYREL Take 1 tablet (100 mg total) by mouth at bedtime as needed for sleep.          Objective:    BP 130/79   Pulse (!) 105   Temp (!) 97.4 F (36.3 C) (Oral)   Ht 5\' 6"  (1.676 m)   Wt 105 lb 9.6 oz (47.9 kg)   BMI 17.04  kg/m   No Known Allergies  Physical Exam  Constitutional: She is oriented to person, place, and time. She appears well-developed and well-nourished.  HENT:  Head: Normocephalic and atraumatic.  Eyes: Pupils are equal, round, and reactive to light. Conjunctivae and EOM are normal.  Cardiovascular: Normal rate, regular rhythm, normal heart sounds and intact distal pulses.   Pulmonary/Chest: Effort normal and breath sounds normal.  Abdominal: Soft. Bowel sounds are normal.  Neurological: She is alert and oriented to person, place, and time. She has normal reflexes.  Skin: Skin is warm and dry. No rash noted.  Psychiatric: She has a normal mood and affect. Her behavior is normal. Judgment and thought content normal.  Nursing note and vitals reviewed.   No results found for this or any previous visit.    Assessment & Plan:   1. Primary insomnia - traZODone (DESYREL) 100 MG tablet; Take 1 tablet (100 mg total) by mouth at bedtime as needed for sleep.  Dispense: 30 tablet; Refill: 5  2. DDD (degenerative disc disease), lumbar - HYDROcodone-acetaminophen (NORCO) 10-325 MG tablet; Take 1-2 tablets by mouth every 6 (six) hours as needed.  Dispense: 240 tablet; Refill: 0 - HYDROcodone-acetaminophen (NORCO) 10-325 MG tablet; Take 1-2 tablets by mouth every 6 (six) hours as needed.  Dispense: 240 tablet; Refill: 0 - HYDROcodone-acetaminophen (NORCO) 10-325 MG tablet; Take 1-2 tablets by mouth every 6 (six) hours as needed.  Dispense: 240 tablet; Refill: 0  3. Moderate episode of recurrent major depressive disorder (HCC) - ALPRAZolam (XANAX) 0.5 MG tablet; Take 1 Tablet by mouth 2 times a day as needed  Dispense: 60 tablet; Refill: 5    Current Outpatient Prescriptions:  .  ALPRAZolam (XANAX) 0.5 MG tablet, Take 1 Tablet by mouth 2 times a day as needed, Disp: 60 tablet, Rfl: 5 .  citalopram (CELEXA) 20 MG tablet, Take 1 tablet (20 mg total) by mouth daily., Disp: 30 tablet, Rfl: 5 .   HYDROcodone-acetaminophen (NORCO) 10-325 MG tablet, Take 1-2 tablets by mouth every 6 (six) hours as needed., Disp: 240 tablet, Rfl: 0 .  HYDROcodone-acetaminophen (NORCO) 10-325 MG tablet, Take 1-2 tablets by mouth every 6 (six) hours as needed., Disp: 240 tablet, Rfl: 0 .  HYDROcodone-acetaminophen (NORCO) 10-325 MG tablet, Take 1-2 tablets by mouth every 6 (six) hours as needed., Disp: 240 tablet, Rfl: 0 .  lisinopril (PRINIVIL,ZESTRIL) 20 MG tablet, Take 1 tablet (20 mg total) by mouth daily., Disp: 90 tablet, Rfl: 3 .  megestrol (MEGACE) 40 MG tablet, Take 1 Tablet by mouth 2 times a day, Disp: 60 tablet, Rfl: 0 .  traZODone (DESYREL)  100 MG tablet, Take 1 tablet (100 mg total) by mouth at bedtime as needed for sleep., Disp: 30 tablet, Rfl: 5 Continue all other maintenance medications as listed above.  Follow up plan: Return in about 3 months (around 02/18/2017) for recheck 3 months.  Educational handout given for survey  Remus Loffler PA-C [facility] [address]  [place], [place] [address] [phone]   11/18/2016, 11:36 AM

## 2016-11-18 NOTE — Patient Instructions (Signed)
In a few days you may receive a survey in the mail or online from [employer] regarding your visit with us today. Please take a moment to fill this out. Your feedback is very important to our whole office. It can help us better understand your needs as well as improve your experience and satisfaction. Thank you for taking your time to complete it. We care about you.  Konnie Noffsinger, PA-C  

## 2016-11-20 ENCOUNTER — Ambulatory Visit: Payer: Medicare Other | Admitting: Physician Assistant

## 2017-02-05 ENCOUNTER — Other Ambulatory Visit: Payer: Self-pay | Admitting: Family Medicine

## 2017-02-19 ENCOUNTER — Ambulatory Visit (INDEPENDENT_AMBULATORY_CARE_PROVIDER_SITE_OTHER): Payer: Medicare Other | Admitting: Physician Assistant

## 2017-02-19 ENCOUNTER — Encounter: Payer: Self-pay | Admitting: Physician Assistant

## 2017-02-19 VITALS — BP 84/56 | HR 100 | Temp 98.4°F | Ht 66.0 in | Wt 112.2 lb

## 2017-02-19 DIAGNOSIS — F5101 Primary insomnia: Secondary | ICD-10-CM

## 2017-02-19 DIAGNOSIS — F331 Major depressive disorder, recurrent, moderate: Secondary | ICD-10-CM

## 2017-02-19 DIAGNOSIS — M5136 Other intervertebral disc degeneration, lumbar region: Secondary | ICD-10-CM

## 2017-02-19 DIAGNOSIS — I10 Essential (primary) hypertension: Secondary | ICD-10-CM

## 2017-02-19 MED ORDER — HYDROCODONE-ACETAMINOPHEN 10-325 MG PO TABS: 1.0000 | ORAL_TABLET | Freq: Four times a day (QID) | ORAL | 0 refills | Status: DC | PRN

## 2017-02-19 MED ORDER — HYDROCODONE-ACETAMINOPHEN 10-325 MG PO TABS
1.0000 | ORAL_TABLET | Freq: Four times a day (QID) | ORAL | 0 refills | Status: DC | PRN
Start: 2017-02-19 — End: 2017-05-20

## 2017-02-19 MED ORDER — ALPRAZOLAM 0.5 MG PO TABS: ORAL_TABLET | ORAL | 5 refills | Status: DC

## 2017-02-19 NOTE — Progress Notes (Signed)
BP (!) 84/56   Pulse 100   Temp 98.4 F (36.9 C) (Oral)   Ht 5\' 6"  (1.676 m)   Wt 112 lb 3.2 oz (50.9 kg)   BMI 18.11 kg/m    Subjective:    Patient ID: Betty Nelson, female    DOB: October 23, 1960, 56 y.o.   MRN: [id number]  HPI: Betty Herteramela G Glade is a 56 y.o. female presenting on 02/19/2017 for Follow-up (3 month )  This patient comes in for periodic recheck on medications and conditions including DDD with chronic pain, depression, insomnia and resolved hypertension.  She has low blood pressure today and we will stop the lisinopril.  Reports overall she is doing okay. Admits to drinking again 2-3 beer daily.   All medications are reviewed today. There are no reports of any problems with the medications. All of the medical conditions are reviewed and updated.  Lab work is reviewed and will be ordered as medically necessary. There are no new problems reported with today's visit.   Relevant past medical, surgical, family and social history reviewed and updated as indicated. Allergies and medications reviewed and updated.  Past Medical History:  Diagnosis Date  . Anxiety   . DDD (degenerative disc disease), lumbar   . DDD (degenerative disc disease), lumbar   . Depression   . Disability of walking   . Hypertension     History reviewed. No pertinent surgical history.  Review of Systems  Constitutional: Negative.  Negative for activity change, fatigue and fever.  HENT: Negative.   Eyes: Negative.   Respiratory: Negative.  Negative for cough.   Cardiovascular: Negative.  Negative for chest pain.  Gastrointestinal: Negative.  Negative for abdominal pain.  Endocrine: Negative.   Genitourinary: Negative.  Negative for dysuria.  Musculoskeletal: Negative.   Skin: Negative.   Neurological: Negative.     Allergies as of 02/19/2017   No Known Allergies     Medication List        Accurate as of 02/19/17 11:45 AM. Always use your most recent med list.          ALPRAZolam  0.5 MG tablet Commonly known as:  XANAX Take 1 Tablet by mouth 2 times a day as needed   citalopram 20 MG tablet Commonly known as:  CELEXA Take 1 tablet (20 mg total) by mouth daily.   HYDROcodone-acetaminophen 10-325 MG tablet Commonly known as:  NORCO Take 1-2 tablets every 6 (six) hours as needed by mouth.   HYDROcodone-acetaminophen 10-325 MG tablet Commonly known as:  NORCO Take 1-2 tablets every 6 (six) hours as needed by mouth.   HYDROcodone-acetaminophen 10-325 MG tablet Commonly known as:  NORCO Take 1-2 tablets every 6 (six) hours as needed by mouth.   megestrol 40 MG tablet Commonly known as:  MEGACE Take 1 Tablet by mouth 2 times a day   traZODone 100 MG tablet Commonly known as:  DESYREL Take 1 tablet (100 mg total) by mouth at bedtime as needed for sleep.          Objective:    BP (!) 84/56   Pulse 100   Temp 98.4 F (36.9 C) (Oral)   Ht 5\' 6"  (1.676 m)   Wt 112 lb 3.2 oz (50.9 kg)   BMI 18.11 kg/m   No Known Allergies  Physical Exam  Constitutional: She is oriented to person, place, and time. She appears well-developed and well-nourished.  HENT:  Head: Normocephalic and atraumatic.  Eyes: Conjunctivae and EOM are  normal. Pupils are equal, round, and reactive to light.  Cardiovascular: Normal rate, regular rhythm, normal heart sounds and intact distal pulses.  Pulmonary/Chest: Effort normal and breath sounds normal.  Abdominal: Soft. Bowel sounds are normal.  Neurological: She is alert and oriented to person, place, and time. She has normal reflexes.  Skin: Skin is warm and dry. No rash noted.  Psychiatric: She has a normal mood and affect. Her behavior is normal. Judgment and thought content normal.  Nursing note and vitals reviewed.   No results found for this or any previous visit.    Assessment & Plan:   1. DDD (degenerative disc disease), lumbar - HYDROcodone-acetaminophen (NORCO) 10-325 MG tablet; Take 1-2 tablets every 6 (six) hours  as needed by mouth.  Dispense: 240 tablet; Refill: 0 - HYDROcodone-acetaminophen (NORCO) 10-325 MG tablet; Take 1-2 tablets every 6 (six) hours as needed by mouth.  Dispense: 240 tablet; Refill: 0 - HYDROcodone-acetaminophen (NORCO) 10-325 MG tablet; Take 1-2 tablets every 6 (six) hours as needed by mouth.  Dispense: 240 tablet; Refill: 0  2. Moderate episode of recurrent major depressive disorder (HCC) - ALPRAZolam (XANAX) 0.5 MG tablet; Take 1 Tablet by mouth 2 times a day as needed  Dispense: 60 tablet; Refill: 5  3. Primary insomnia  4. Essential hypertension    Current Outpatient Medications:  .  ALPRAZolam (XANAX) 0.5 MG tablet, Take 1 Tablet by mouth 2 times a day as needed, Disp: 60 tablet, Rfl: 5 .  citalopram (CELEXA) 20 MG tablet, Take 1 tablet (20 mg total) by mouth daily., Disp: 30 tablet, Rfl: 5 .  HYDROcodone-acetaminophen (NORCO) 10-325 MG tablet, Take 1-2 tablets every 6 (six) hours as needed by mouth., Disp: 240 tablet, Rfl: 0 .  HYDROcodone-acetaminophen (NORCO) 10-325 MG tablet, Take 1-2 tablets every 6 (six) hours as needed by mouth., Disp: 240 tablet, Rfl: 0 .  HYDROcodone-acetaminophen (NORCO) 10-325 MG tablet, Take 1-2 tablets every 6 (six) hours as needed by mouth., Disp: 240 tablet, Rfl: 0 .  megestrol (MEGACE) 40 MG tablet, Take 1 Tablet by mouth 2 times a day, Disp: 60 tablet, Rfl: 5 .  traZODone (DESYREL) 100 MG tablet, Take 1 tablet (100 mg total) by mouth at bedtime as needed for sleep., Disp: 30 tablet, Rfl: 5 Continue all other maintenance medications as listed above.  Follow up plan: Return in about 3 months (around 05/22/2017) for recheck.  Educational handout given for survey  Remus LofflerAngel S. Kaceton Vieau PA-C [facility] [address]  [place], [place] [id number] [phone]   02/19/2017, 11:45 AM

## 2017-02-19 NOTE — Patient Instructions (Addendum)
HOLD LISINOPRIL   In a few days you may receive a survey in the mail or online from [employer] regarding your visit with us today. Please take a moment to fill this out. Your feedback is very important to our whole office. It can help us better understand your needs as well as improve your experience and satisfaction. Thank you for taking your time to complete it. We care about you.  Prudy FeelerAngel Jermari Tamargo, PA-C

## 2017-04-02 ENCOUNTER — Other Ambulatory Visit: Payer: Self-pay | Admitting: Physician Assistant

## 2017-04-28 ENCOUNTER — Other Ambulatory Visit: Payer: Self-pay | Admitting: Physician Assistant

## 2017-04-28 DIAGNOSIS — F5101 Primary insomnia: Secondary | ICD-10-CM

## 2017-05-20 ENCOUNTER — Other Ambulatory Visit: Payer: Self-pay | Admitting: Physician Assistant

## 2017-05-20 ENCOUNTER — Ambulatory Visit (INDEPENDENT_AMBULATORY_CARE_PROVIDER_SITE_OTHER): Payer: Medicare Other | Admitting: Physician Assistant

## 2017-05-20 ENCOUNTER — Encounter: Payer: Self-pay | Admitting: Physician Assistant

## 2017-05-20 VITALS — BP 147/86 | HR 116 | Temp 98.0°F | Ht 66.0 in | Wt 108.6 lb

## 2017-05-20 DIAGNOSIS — I10 Essential (primary) hypertension: Secondary | ICD-10-CM | POA: Diagnosis not present

## 2017-05-20 DIAGNOSIS — M5136 Other intervertebral disc degeneration, lumbar region: Secondary | ICD-10-CM

## 2017-05-20 DIAGNOSIS — J309 Allergic rhinitis, unspecified: Secondary | ICD-10-CM | POA: Diagnosis not present

## 2017-05-20 DIAGNOSIS — M51369 Other intervertebral disc degeneration, lumbar region without mention of lumbar back pain or lower extremity pain: Secondary | ICD-10-CM

## 2017-05-20 MED ORDER — CYCLOBENZAPRINE HCL 10 MG PO TABS: 10.0000 mg | ORAL_TABLET | Freq: Two times a day (BID) | ORAL | 2 refills | Status: DC

## 2017-05-20 MED ORDER — HYDROCODONE-ACETAMINOPHEN 10-325 MG PO TABS: 1.0000 | ORAL_TABLET | Freq: Four times a day (QID) | ORAL | 0 refills | Status: DC | PRN

## 2017-05-20 MED ORDER — LORATADINE 10 MG PO TABS: 10.0000 mg | ORAL_TABLET | Freq: Every day | ORAL | 11 refills | Status: DC

## 2017-05-20 NOTE — Patient Instructions (Signed)
In a few days you may receive a survey in the mail or online from [employer] regarding your visit with us today. Please take a moment to fill this out. Your feedback is very important to our whole office. It can help us better understand your needs as well as improve your experience and satisfaction. Thank you for taking your time to complete it. We care about you.  Danasha Melman, PA-C  

## 2017-05-20 NOTE — Progress Notes (Signed)
BP (!) 147/86   Pulse (!) 116   Temp 98 F (36.7 C) (Oral)   Ht 5\' 6"  (1.676 m)   Wt 108 lb 9 oz (49.2 kg)   BMI 17.52 kg/m    Subjective:    Patient ID: Betty Nelson, female    DOB: 06/04/1960, 57 y.o.   MRN: [id number]  HPI: Betty Nelson is a 57 y.o. female presenting on 05/20/2017 for Medication Refill  This patient comes in for periodic recheck on medications and conditions including hypertension, disease, allergic rhinitis.  All of her medications need refilling.  She states that overall she is feeling very stable.  The changes be made to her blood pressure medication last time in reducing some has allowed her blood pressure to be very normal.  She has not had any more weak or syncopal spells..   All medications are reviewed today. There are no reports of any problems with the medications. All of the medical conditions are reviewed and updated.  Lab work is reviewed and will be ordered as medically necessary. There are no new problems reported with today's visit.  Relevant past medical, surgical, family and social history reviewed and updated as indicated. Allergies and medications reviewed and updated.  Past Medical History:  Diagnosis Date  . Anxiety   . DDD (degenerative disc disease), lumbar   . DDD (degenerative disc disease), lumbar   . Depression   . Disability of walking   . Hypertension     History reviewed. No pertinent surgical history.  Review of Systems  Constitutional: Negative.  Negative for activity change, fatigue and fever.  HENT: Negative.   Eyes: Negative.   Respiratory: Negative.  Negative for cough.   Cardiovascular: Negative.  Negative for chest pain.  Gastrointestinal: Negative.  Negative for abdominal pain.  Endocrine: Negative.   Genitourinary: Negative.  Negative for dysuria.  Musculoskeletal: Negative.   Skin: Negative.   Neurological: Negative.     Allergies as of 05/20/2017   No Known Allergies     Medication List        Accurate as of 05/20/17 11:59 PM. Always use your most recent med list.          ALPRAZolam 0.5 MG tablet Commonly known as:  XANAX Take 1 Tablet by mouth 2 times a day as needed   citalopram 20 MG tablet Commonly known as:  CELEXA TAKE 1 TABLET BY MOUTH EVERY DAY   cyclobenzaprine 10 MG tablet Commonly known as:  FLEXERIL Take 1 tablet (10 mg total) by mouth 2 (two) times daily.   HYDROcodone-acetaminophen 10-325 MG tablet Commonly known as:  NORCO Take 1-2 tablets by mouth every 6 (six) hours as needed.   HYDROcodone-acetaminophen 10-325 MG tablet Commonly known as:  NORCO Take 1-2 tablets by mouth every 6 (six) hours as needed.   HYDROcodone-acetaminophen 10-325 MG tablet Commonly known as:  NORCO Take 1-2 tablets by mouth every 6 (six) hours as needed.   loratadine 10 MG tablet Commonly known as:  CLARITIN Take 1 tablet (10 mg total) by mouth daily.   megestrol 40 MG tablet Commonly known as:  MEGACE Take 1 Tablet by mouth 2 times a day   traZODone 100 MG tablet Commonly known as:  DESYREL TAKE 1 TABLET BY MOUTH AT BEDTIME AS NEEDED FOR SLEEP          Objective:    BP (!) 147/86   Pulse (!) 116   Temp 98 F (36.7 C) (Oral)  Ht 5\' 6"  (1.676 m)   Wt 108 lb 9 oz (49.2 kg)   BMI 17.52 kg/m   No Known Allergies  Physical Exam  Constitutional: She is oriented to person, place, and time. She appears well-developed and well-nourished.  HENT:  Head: Normocephalic and atraumatic.  Eyes: Conjunctivae and EOM are normal. Pupils are equal, round, and reactive to light.  Cardiovascular: Normal rate, regular rhythm, normal heart sounds and intact distal pulses.  Pulmonary/Chest: Effort normal and breath sounds normal.  Abdominal: Soft. Bowel sounds are normal.  Neurological: She is alert and oriented to person, place, and time. She has normal reflexes.  Skin: Skin is warm and dry. No rash noted.  Psychiatric: She has a normal mood and affect. Her behavior is  normal. Judgment and thought content normal.  Nursing note and vitals reviewed.   No results found for this or any previous visit.    Assessment & Plan:   1. Essential hypertension  2. DDD (degenerative disc disease), lumbar - HYDROcodone-acetaminophen (NORCO) 10-325 MG tablet; Take 1-2 tablets by mouth every 6 (six) hours as needed.  Dispense: 240 tablet; Refill: 0 - HYDROcodone-acetaminophen (NORCO) 10-325 MG tablet; Take 1-2 tablets by mouth every 6 (six) hours as needed.  Dispense: 240 tablet; Refill: 0 - HYDROcodone-acetaminophen (NORCO) 10-325 MG tablet; Take 1-2 tablets by mouth every 6 (six) hours as needed.  Dispense: 240 tablet; Refill: 0 - cyclobenzaprine (FLEXERIL) 10 MG tablet; Take 1 tablet (10 mg total) by mouth 2 (two) times daily.  Dispense: 60 tablet; Refill: 2  3. Allergic rhinitis, unspecified seasonality, unspecified trigger - loratadine (CLARITIN) 10 MG tablet; Take 1 tablet (10 mg total) by mouth daily.  Dispense: 30 tablet; Refill: 11    Current Outpatient Medications:  .  ALPRAZolam (XANAX) 0.5 MG tablet, Take 1 Tablet by mouth 2 times a day as needed, Disp: 60 tablet, Rfl: 5 .  cyclobenzaprine (FLEXERIL) 10 MG tablet, Take 1 tablet (10 mg total) by mouth 2 (two) times daily., Disp: 60 tablet, Rfl: 2 .  HYDROcodone-acetaminophen (NORCO) 10-325 MG tablet, Take 1-2 tablets by mouth every 6 (six) hours as needed., Disp: 240 tablet, Rfl: 0 .  HYDROcodone-acetaminophen (NORCO) 10-325 MG tablet, Take 1-2 tablets by mouth every 6 (six) hours as needed., Disp: 240 tablet, Rfl: 0 .  HYDROcodone-acetaminophen (NORCO) 10-325 MG tablet, Take 1-2 tablets by mouth every 6 (six) hours as needed., Disp: 240 tablet, Rfl: 0 .  loratadine (CLARITIN) 10 MG tablet, Take 1 tablet (10 mg total) by mouth daily., Disp: 30 tablet, Rfl: 11 .  megestrol (MEGACE) 40 MG tablet, Take 1 Tablet by mouth 2 times a day, Disp: 60 tablet, Rfl: 5 .  traZODone (DESYREL) 100 MG tablet, TAKE 1 TABLET BY  MOUTH AT BEDTIME AS NEEDED FOR SLEEP, Disp: 30 tablet, Rfl: 1 .  citalopram (CELEXA) 20 MG tablet, TAKE 1 TABLET BY MOUTH EVERY DAY, Disp: 90 tablet, Rfl: 0 Continue all other maintenance medications as listed above.  Follow up plan: Return in about 3 months (around 08/17/2017) for recheck.  Educational handout given for survey  Remus LofflerAngel S. Melenda Bielak PA-C [facility] [address]  [place], [place] [address] [phone]   05/26/2017, 9:59 AM

## 2017-06-06 ENCOUNTER — Other Ambulatory Visit: Payer: Self-pay | Admitting: Physician Assistant

## 2017-06-06 DIAGNOSIS — F5101 Primary insomnia: Secondary | ICD-10-CM

## 2017-07-02 ENCOUNTER — Other Ambulatory Visit: Payer: Self-pay | Admitting: Physician Assistant

## 2017-08-02 ENCOUNTER — Other Ambulatory Visit: Payer: Self-pay | Admitting: Physician Assistant

## 2017-08-02 DIAGNOSIS — F5101 Primary insomnia: Secondary | ICD-10-CM

## 2017-08-18 ENCOUNTER — Encounter: Payer: Self-pay | Admitting: Physician Assistant

## 2017-08-18 ENCOUNTER — Ambulatory Visit (INDEPENDENT_AMBULATORY_CARE_PROVIDER_SITE_OTHER): Payer: Medicare Other | Admitting: Physician Assistant

## 2017-08-18 DIAGNOSIS — M5136 Other intervertebral disc degeneration, lumbar region: Secondary | ICD-10-CM

## 2017-08-18 DIAGNOSIS — F331 Major depressive disorder, recurrent, moderate: Secondary | ICD-10-CM

## 2017-08-18 MED ORDER — HYDROCODONE-ACETAMINOPHEN 10-325 MG PO TABS: 1.0000 | ORAL_TABLET | Freq: Four times a day (QID) | ORAL | 0 refills | Status: DC | PRN

## 2017-08-18 MED ORDER — CITALOPRAM HYDROBROMIDE 40 MG PO TABS: 40.0000 mg | ORAL_TABLET | Freq: Every day | ORAL | 5 refills | Status: DC

## 2017-08-18 MED ORDER — ALPRAZOLAM 0.5 MG PO TABS: ORAL_TABLET | ORAL | 5 refills | Status: DC

## 2017-08-18 MED ORDER — HYDROCODONE-ACETAMINOPHEN 10-325 MG PO TABS
1.0000 | ORAL_TABLET | Freq: Four times a day (QID) | ORAL | 0 refills | Status: DC | PRN
Start: 2017-08-18 — End: 2017-10-05

## 2017-08-18 MED ORDER — CYCLOBENZAPRINE HCL 10 MG PO TABS
10.0000 mg | ORAL_TABLET | Freq: Two times a day (BID) | ORAL | 5 refills | Status: DC
Start: 2017-08-18 — End: 2018-03-09

## 2017-08-18 NOTE — Patient Instructions (Signed)
In a few days you may receive a survey in the mail or online from [employer] regarding your visit with us today. Please take a moment to fill this out. Your feedback is very important to our whole office. It can help us better understand your needs as well as improve your experience and satisfaction. Thank you for taking your time to complete it. We care about you.  Krystelle Prashad, PA-C  

## 2017-08-18 NOTE — Progress Notes (Signed)
BP 110/63 (BP Location: Left Arm)   Pulse (!) 107   Temp (!) 97.1 F (36.2 C) (Oral)   Ht  (1.676 m)   Wt 106 lb (48.1 kg)   BMI 17.11 kg/m    Subjective:    Patient ID: Betty Nelson, female    DOB: December 24, 1960, 57 y.o.   MRN: [id number]  HPI: Betty Nelson is a 56 y.o. female presenting on 08/18/2017 for pain management (3 mo)  Chronic pain is under control, patient has no new complaints. Medications are keeping things stable. Needs refills for the next three months.  [place] Controlled Substance website checked and normal. Drug screen normal this year.  Depression is worse and would like to adjust treatment. Depression screen [facility] 2/9 08/18/2017 05/20/2017 02/19/2017 11/18/2016 08/20/2016  Decreased Interest 0 1  Down, Depressed, Hopeless PHQ - 2 Score Altered sleeping - 2  Tired, decreased energy - 3  Change in appetite - 3  Feeling bad or failure about yourself  - 1  Trouble concentrating - 1  Moving slowly or fidgety/restless - 1  Suicidal thoughts 0 2 3 - 0  PHQ-9 Score - 14  Difficult doing work/chores Somewhat difficult - - - -     Past Medical History:  Diagnosis Date  . Anxiety   . DDD (degenerative disc disease), lumbar   . DDD (degenerative disc disease), lumbar   . Depression   . Disability of walking   . Hypertension    Relevant past medical, surgical, family and social history reviewed and updated as indicated. Interim medical history since our last visit reviewed. Allergies and medications reviewed and updated. DATA REVIEWED: CHART IN EPIC  Family History reviewed for pertinent findings.  Review of Systems  Constitutional: Negative.  Negative for activity change, fatigue and fever.  HENT: Negative.   Eyes: Negative.   Respiratory: Negative.  Negative for cough.   Cardiovascular: Negative.  Negative for chest pain.  Gastrointestinal: Negative.  Negative for abdominal pain.    Endocrine: Negative.   Genitourinary: Negative.  Negative for dysuria.  Musculoskeletal: Positive for arthralgias, back pain and gait problem.  Skin: Negative.   Psychiatric/Behavioral: Positive for decreased concentration and sleep disturbance.    Allergies as of 08/18/2017   No Known Allergies     Medication List        Accurate as of 08/18/17 11:59 PM. Always use your most recent med list.          ALPRAZolam 0.5 MG tablet Commonly known as:  XANAX Take 1 Tablet by mouth 2 times a day as needed   citalopram 40 MG tablet Commonly known as:  CELEXA Take 1 tablet (40 mg total) by mouth daily.   cyclobenzaprine 10 MG tablet Commonly known as:  FLEXERIL Take 1 tablet (10 mg total) by mouth 2 (two) times daily.   HYDROcodone-acetaminophen 10-325 MG tablet Commonly known as:  NORCO Take 1-2 tablets by mouth every 6 (six) hours as needed.   HYDROcodone-acetaminophen 10-325 MG tablet Commonly known as:  NORCO Take 1-2 tablets by mouth every 6 (six) hours as needed.   HYDROcodone-acetaminophen 10-325 MG tablet Commonly known as:  NORCO Take 1-2 tablets by mouth every 6 (six) hours as needed.   loratadine 10 MG tablet Commonly known as:  CLARITIN Take 1 tablet (10 mg total) by mouth daily.   megestrol 40 MG tablet Commonly known as:  MEGACE TAKE 1 TABLET BY MOUTH 2 TIMES A DAY   traZODone 100 MG tablet Commonly known as:  DESYREL TAKE 1 TABLET BY MOUTH EVERY DAY AT BEDTIME AS NEEDED FOR SLEEP          Objective:    BP 110/63 (BP Location: Left Arm)   Pulse (!) 107   Temp (!) 97.1 F (36.2 C) (Oral)   Ht  (1.676 m)   Wt 106 lb (48.1 kg)   BMI 17.11 kg/m   No Known Allergies  Wt Readings from Last 3 Encounters:  08/18/17 106 lb (48.1 kg)  05/20/17 108 lb 9 oz (49.2 kg)  02/19/17 112 lb 3.2 oz (50.9 kg)    Physical Exam  Constitutional: She is oriented to person, place, and time. She appears well-developed and well-nourished.  HENT:  Head:  Normocephalic and atraumatic.  Eyes: Pupils are equal, round, and reactive to light. Conjunctivae and EOM are normal.  Cardiovascular: Normal rate, regular rhythm, normal heart sounds and intact distal pulses.  Pulmonary/Chest: Effort normal and breath sounds normal.  Abdominal: Soft. Bowel sounds are normal.  Neurological: She is alert and oriented to person, place, and time. She has normal reflexes.  Skin: Skin is warm and dry. No rash noted.  Psychiatric: She has a normal mood and affect. Her behavior is normal. Judgment and thought content normal.    No results found for this or any previous visit.    Assessment & Plan:   1. DDD (degenerative disc disease), lumbar - HYDROcodone-acetaminophen (NORCO) 10-325 MG tablet; Take 1-2 tablets by mouth every 6 (six) hours as needed.  Dispense: 240 tablet; Refill: 0 - HYDROcodone-acetaminophen (NORCO) 10-325 MG tablet; Take 1-2 tablets by mouth every 6 (six) hours as needed.  Dispense: 240 tablet; Refill: 0 - HYDROcodone-acetaminophen (NORCO) 10-325 MG tablet; Take 1-2 tablets by mouth every 6 (six) hours as needed.  Dispense: 240 tablet; Refill: 0 - cyclobenzaprine (FLEXERIL) 10 MG tablet; Take 1 tablet (10 mg total) by mouth 2 (two) times daily.  Dispense: 60 tablet; Refill: 5  2. Moderate episode of recurrent major depressive disorder (HCC) - citalopram (CELEXA) 40 MG tablet; Take 1 tablet (40 mg total) by mouth daily.  Dispense: 30 tablet; Refill: 5 - ALPRAZolam (XANAX) 0.5 MG tablet; Take 1 Tablet by mouth 2 times a day as needed  Dispense: 60 tablet; Refill: 5   Continue all other maintenance medications as listed above.  Follow up plan: Return in about 3 months (around 11/18/2017) for recheck.  Educational handout given for survey  Remus Loffler PA-C [facility] [address]  [place], [place] [address] [phone]   08/19/2017, 10:03  AM

## 2017-09-01 ENCOUNTER — Other Ambulatory Visit: Payer: Self-pay | Admitting: Physician Assistant

## 2017-10-05 ENCOUNTER — Inpatient Hospital Stay (HOSPITAL_COMMUNITY): Payer: Medicare Other

## 2017-10-05 ENCOUNTER — Emergency Department (HOSPITAL_COMMUNITY): Payer: Medicare Other

## 2017-10-05 ENCOUNTER — Inpatient Hospital Stay (HOSPITAL_COMMUNITY)
Admission: EM | Admit: 2017-10-05 | Discharge: 2017-10-10 | DRG: 091 | Disposition: A | Discharge status: Home Health | Payer: Medicare Other | Attending: Internal Medicine | Admitting: Internal Medicine

## 2017-10-05 ENCOUNTER — Encounter (HOSPITAL_COMMUNITY): Payer: Self-pay | Admitting: Emergency Medicine

## 2017-10-05 DIAGNOSIS — F17200 Nicotine dependence, unspecified, uncomplicated: Secondary | ICD-10-CM | POA: Diagnosis present

## 2017-10-05 DIAGNOSIS — E878 Other disorders of electrolyte and fluid balance, not elsewhere classified: Secondary | ICD-10-CM | POA: Diagnosis present

## 2017-10-05 DIAGNOSIS — F10239 Alcohol dependence with withdrawal, unspecified: Secondary | ICD-10-CM | POA: Diagnosis not present

## 2017-10-05 DIAGNOSIS — J309 Allergic rhinitis, unspecified: Secondary | ICD-10-CM

## 2017-10-05 DIAGNOSIS — Z781 Physical restraint status: Secondary | ICD-10-CM | POA: Diagnosis not present

## 2017-10-05 DIAGNOSIS — T670XXA Heatstroke and sunstroke, initial encounter: Secondary | ICD-10-CM | POA: Diagnosis not present

## 2017-10-05 DIAGNOSIS — G8384 Todd's paralysis (postepileptic): Secondary | ICD-10-CM | POA: Diagnosis not present

## 2017-10-05 DIAGNOSIS — E512 Wernicke's encephalopathy: Secondary | ICD-10-CM | POA: Diagnosis not present

## 2017-10-05 DIAGNOSIS — R4182 Altered mental status, unspecified: Secondary | ICD-10-CM | POA: Diagnosis not present

## 2017-10-05 DIAGNOSIS — E871 Hypo-osmolality and hyponatremia: Secondary | ICD-10-CM | POA: Diagnosis present

## 2017-10-05 DIAGNOSIS — G8191 Hemiplegia, unspecified affecting right dominant side: Secondary | ICD-10-CM | POA: Diagnosis present

## 2017-10-05 DIAGNOSIS — R569 Unspecified convulsions: Secondary | ICD-10-CM

## 2017-10-05 DIAGNOSIS — F1010 Alcohol abuse, uncomplicated: Secondary | ICD-10-CM | POA: Diagnosis not present

## 2017-10-05 DIAGNOSIS — E860 Dehydration: Secondary | ICD-10-CM | POA: Diagnosis present

## 2017-10-05 DIAGNOSIS — G9340 Encephalopathy, unspecified: Secondary | ICD-10-CM | POA: Insufficient documentation

## 2017-10-05 DIAGNOSIS — R32 Unspecified urinary incontinence: Secondary | ICD-10-CM | POA: Diagnosis present

## 2017-10-05 DIAGNOSIS — E876 Hypokalemia: Secondary | ICD-10-CM | POA: Diagnosis not present

## 2017-10-05 DIAGNOSIS — E43 Unspecified severe protein-calorie malnutrition: Secondary | ICD-10-CM | POA: Diagnosis present

## 2017-10-05 DIAGNOSIS — F13239 Sedative, hypnotic or anxiolytic dependence with withdrawal, unspecified: Secondary | ICD-10-CM | POA: Diagnosis not present

## 2017-10-05 DIAGNOSIS — R479 Unspecified speech disturbances: Secondary | ICD-10-CM | POA: Diagnosis not present

## 2017-10-05 DIAGNOSIS — Z79899 Other long term (current) drug therapy: Secondary | ICD-10-CM | POA: Diagnosis not present

## 2017-10-05 DIAGNOSIS — F10231 Alcohol dependence with withdrawal delirium: Secondary | ICD-10-CM | POA: Diagnosis present

## 2017-10-05 DIAGNOSIS — G9341 Metabolic encephalopathy: Secondary | ICD-10-CM | POA: Diagnosis present

## 2017-10-05 DIAGNOSIS — F329 Major depressive disorder, single episode, unspecified: Secondary | ICD-10-CM | POA: Diagnosis present

## 2017-10-05 DIAGNOSIS — E872 Acidosis: Secondary | ICD-10-CM | POA: Diagnosis present

## 2017-10-05 DIAGNOSIS — R402421 Glasgow coma scale score 9-12, in the field [EMT or ambulance]: Secondary | ICD-10-CM | POA: Diagnosis not present

## 2017-10-05 DIAGNOSIS — I6523 Occlusion and stenosis of bilateral carotid arteries: Secondary | ICD-10-CM | POA: Diagnosis not present

## 2017-10-05 DIAGNOSIS — I10 Essential (primary) hypertension: Secondary | ICD-10-CM | POA: Diagnosis present

## 2017-10-05 DIAGNOSIS — E440 Moderate protein-calorie malnutrition: Secondary | ICD-10-CM | POA: Diagnosis not present

## 2017-10-05 DIAGNOSIS — E46 Unspecified protein-calorie malnutrition: Secondary | ICD-10-CM | POA: Diagnosis present

## 2017-10-05 DIAGNOSIS — I6522 Occlusion and stenosis of left carotid artery: Secondary | ICD-10-CM | POA: Diagnosis not present

## 2017-10-05 DIAGNOSIS — F5101 Primary insomnia: Secondary | ICD-10-CM

## 2017-10-05 DIAGNOSIS — Z681 Body mass index (BMI) 19 or less, adult: Secondary | ICD-10-CM

## 2017-10-05 DIAGNOSIS — F419 Anxiety disorder, unspecified: Secondary | ICD-10-CM | POA: Diagnosis present

## 2017-10-05 DIAGNOSIS — F331 Major depressive disorder, recurrent, moderate: Secondary | ICD-10-CM

## 2017-10-05 DIAGNOSIS — F32A Depression, unspecified: Secondary | ICD-10-CM | POA: Diagnosis present

## 2017-10-05 HISTORY — DX: Headache: R51

## 2017-10-05 HISTORY — DX: Unspecified convulsions: R56.9

## 2017-10-05 HISTORY — DX: Low back pain, unspecified: M54.50

## 2017-10-05 HISTORY — DX: Bipolar disorder, unspecified: F31.9

## 2017-10-05 HISTORY — DX: Todd's paralysis (postepileptic): G83.84

## 2017-10-05 HISTORY — DX: Headache, unspecified: R51.9

## 2017-10-05 HISTORY — DX: Low back pain: M54.5

## 2017-10-05 HISTORY — DX: Other chronic pain: G89.29

## 2017-10-05 LAB — CBC
HCT: 34.2 % — ABNORMAL LOW (ref 36.0–46.0)
HEMOGLOBIN: 12.3 g/dL (ref 12.0–15.0)
MCH: 32.8 pg (ref 26.0–34.0)
MCHC: 36 g/dL (ref 30.0–36.0)
MCV: 91.2 fL (ref 78.0–100.0)
PLATELETS: 162 10*3/uL (ref 150–400)
RBC: 3.75 MIL/uL — AB (ref 3.87–5.11)
RDW: 18.2 % — ABNORMAL HIGH (ref 11.5–15.5)
WBC: 7.7 10*3/uL (ref 4.0–10.5)

## 2017-10-05 LAB — TSH: TSH: 1.691 u[IU]/mL (ref 0.350–4.500)

## 2017-10-05 LAB — CSF CELL COUNT WITH DIFFERENTIAL
RBC COUNT CSF: 1 /mm3 — AB
TUBE #: 1
WBC, CSF: 2 /mm3 (ref 0–5)

## 2017-10-05 LAB — CBC WITH DIFFERENTIAL/PLATELET
BASOS PCT: 0 %
Basophils Absolute: 0 10*3/uL (ref 0.0–0.1)
EOS ABS: 0 10*3/uL (ref 0.0–0.7)
Eosinophils Relative: 0 %
HCT: 43.4 % (ref 36.0–46.0)
Hemoglobin: 15.1 g/dL — ABNORMAL HIGH (ref 12.0–15.0)
LYMPHS PCT: 9 %
Lymphs Abs: 0.8 10*3/uL (ref 0.7–4.0)
MCH: 33 pg (ref 26.0–34.0)
MCHC: 34.8 g/dL (ref 30.0–36.0)
MCV: 94.8 fL (ref 78.0–100.0)
MONO ABS: 0.4 10*3/uL (ref 0.1–1.0)
Monocytes Relative: 5 %
NEUTROS ABS: 7.7 10*3/uL (ref 1.7–7.7)
Neutrophils Relative %: 86 %
Platelets: 172 10*3/uL (ref 150–400)
RBC: 4.58 MIL/uL (ref 3.87–5.11)
RDW: 19.4 % — ABNORMAL HIGH (ref 11.5–15.5)
WBC: 9 10*3/uL (ref 4.0–10.5)

## 2017-10-05 LAB — PROTEIN AND GLUCOSE, CSF
Glucose, CSF: 78 mg/dL — ABNORMAL HIGH (ref 40–70)
TOTAL PROTEIN, CSF: 36 mg/dL (ref 15–45)

## 2017-10-05 LAB — COMPREHENSIVE METABOLIC PANEL
ALT: 28 U/L (ref 0–44)
AST: 58 U/L — AB (ref 15–41)
Albumin: 4.6 g/dL (ref 3.5–5.0)
Alkaline Phosphatase: 63 U/L (ref 38–126)
Anion gap: 17 — ABNORMAL HIGH (ref 5–15)
BUN: 7 mg/dL (ref 6–20)
CHLORIDE: 95 mmol/L — AB (ref 98–111)
CO2: 19 mmol/L — AB (ref 22–32)
Calcium: 9.1 mg/dL (ref 8.9–10.3)
Creatinine, Ser: 0.74 mg/dL (ref 0.44–1.00)
GFR calc Af Amer: >60 mL/min (ref >60)
GFR calc non Af Amer: >60 mL/min (ref >60)
GLUCOSE: 162 mg/dL — AB (ref 70–99)
POTASSIUM: 4 mmol/L (ref 3.5–5.1)
SODIUM: 131 mmol/L — AB (ref 135–145)
Total Bilirubin: 1.2 mg/dL (ref 0.3–1.2)
Total Protein: 8.8 g/dL — ABNORMAL HIGH (ref 6.5–8.1)

## 2017-10-05 LAB — URINALYSIS, ROUTINE W REFLEX MICROSCOPIC
Bacteria, UA: NONE SEEN
Bilirubin Urine: NEGATIVE
GLUCOSE, UA: 50 mg/dL — AB
Ketones, ur: NEGATIVE mg/dL
Leukocytes, UA: NEGATIVE
NITRITE: NEGATIVE
Protein, ur: >=300 mg/dL — AB
SPECIFIC GRAVITY, URINE: 1.012 (ref 1.005–1.030)
pH: 6 (ref 5.0–8.0)

## 2017-10-05 LAB — RAPID URINE DRUG SCREEN, HOSP PERFORMED
Amphetamines: NOT DETECTED
BENZODIAZEPINES: POSITIVE — AB
Cocaine: NOT DETECTED
OPIATES: POSITIVE — AB
TETRAHYDROCANNABINOL: NOT DETECTED

## 2017-10-05 LAB — ETHANOL

## 2017-10-05 LAB — CREATININE, SERUM
CREATININE: 0.72 mg/dL (ref 0.44–1.00)
GFR calc Af Amer: >60 mL/min (ref >60)

## 2017-10-05 LAB — AMMONIA: Ammonia: 52 umol/L — ABNORMAL HIGH (ref 9–35)

## 2017-10-05 LAB — LACTIC ACID, PLASMA
LACTIC ACID, VENOUS: 3.4 mmol/L — AB (ref 0.5–1.9)
Lactic Acid, Venous: 3.1 mmol/L (ref 0.5–1.9)

## 2017-10-05 LAB — VITAMIN B12: Vitamin B-12: 301 pg/mL (ref 180–914)

## 2017-10-05 LAB — PHOSPHORUS: Phosphorus: 3.4 mg/dL (ref 2.5–4.6)

## 2017-10-05 LAB — MAGNESIUM: Magnesium: 1.3 mg/dL — ABNORMAL LOW (ref 1.7–2.4)

## 2017-10-05 LAB — CBG MONITORING, ED: GLUCOSE-CAPILLARY: 175 mg/dL — AB (ref 70–99)

## 2017-10-05 MED ORDER — LEVETIRACETAM IN NACL 500 MG/100ML IV SOLN
500.0000 mg | Freq: Two times a day (BID) | INTRAVENOUS | Status: DC
  Administered 2017-10-06 – 2017-10-08 (×5): 500 mg via INTRAVENOUS
  Filled 2017-10-05 (×7): qty 100

## 2017-10-05 MED ORDER — HALOPERIDOL LACTATE 5 MG/ML IJ SOLN
2.0000 mg | Freq: Once | INTRAMUSCULAR | Status: AC
  Administered 2017-10-05: 2 mg via INTRAVENOUS
  Filled 2017-10-05: qty 1

## 2017-10-05 MED ORDER — THIAMINE HCL 100 MG/ML IJ SOLN
100.0000 mg | Freq: Once | INTRAMUSCULAR | Status: AC
  Administered 2017-10-05: 100 mg via INTRAVENOUS
  Filled 2017-10-05: qty 2

## 2017-10-05 MED ORDER — LORATADINE 10 MG PO TABS
10.0000 mg | ORAL_TABLET | Freq: Every day | ORAL | Status: DC
  Administered 2017-10-07 – 2017-10-10 (×4): 10 mg via ORAL
  Filled 2017-10-05 (×4): qty 1

## 2017-10-05 MED ORDER — THIAMINE HCL 100 MG/ML IJ SOLN: 500.0000 mg | Freq: Three times a day (TID) | INTRAVENOUS | Status: DC

## 2017-10-05 MED ORDER — THIAMINE HCL 100 MG/ML IJ SOLN: 500.0000 mg | Freq: Every day | INTRAVENOUS | Status: DC

## 2017-10-05 MED ORDER — LORAZEPAM 2 MG/ML IJ SOLN
1.0000 mg | INTRAMUSCULAR | Status: DC | PRN
Start: 2017-10-05 — End: 2017-10-05
  Administered 2017-10-05 (×2): 1 mg via INTRAVENOUS
  Filled 2017-10-05 (×2): qty 1

## 2017-10-05 MED ORDER — ONDANSETRON HCL 4 MG PO TABS: 4.0000 mg | ORAL_TABLET | Freq: Four times a day (QID) | ORAL | Status: DC | PRN

## 2017-10-05 MED ORDER — LABETALOL HCL 5 MG/ML IV SOLN
20.0000 mg | Freq: Once | INTRAVENOUS | Status: AC
  Administered 2017-10-05: 20 mg via INTRAVENOUS
  Filled 2017-10-05: qty 4

## 2017-10-05 MED ORDER — ONDANSETRON 4 MG PO TBDP
ORAL_TABLET | ORAL | Status: AC
  Filled 2017-10-05: qty 1

## 2017-10-05 MED ORDER — ONDANSETRON HCL 4 MG/2ML IJ SOLN
4.0000 mg | Freq: Four times a day (QID) | INTRAMUSCULAR | Status: DC | PRN
  Filled 2017-10-05: qty 2

## 2017-10-05 MED ORDER — LORAZEPAM 2 MG/ML IJ SOLN
2.0000 mg | INTRAMUSCULAR | Status: DC | PRN
  Administered 2017-10-05: 3 mg via INTRAVENOUS
  Administered 2017-10-05 – 2017-10-06 (×6): 2 mg via INTRAVENOUS
  Administered 2017-10-06: 3 mg via INTRAVENOUS
  Administered 2017-10-07 – 2017-10-10 (×6): 2 mg via INTRAVENOUS
  Filled 2017-10-05 (×3): qty 1
  Filled 2017-10-05: qty 2
  Filled 2017-10-05 (×3): qty 1
  Filled 2017-10-05 (×2): qty 2
  Filled 2017-10-05: qty 1
  Filled 2017-10-05: qty 2
  Filled 2017-10-05 (×3): qty 1
  Filled 2017-10-05: qty 2

## 2017-10-05 MED ORDER — SODIUM CHLORIDE 0.9 % IV BOLUS
500.0000 mL | Freq: Once | INTRAVENOUS | Status: AC
Start: 2017-10-05 — End: 2017-10-05
  Administered 2017-10-05: 500 mL via INTRAVENOUS

## 2017-10-05 MED ORDER — FOLIC ACID 5 MG/ML IJ SOLN
1.0000 mg | Freq: Every day | INTRAMUSCULAR | Status: DC
  Administered 2017-10-05 – 2017-10-08 (×4): 1 mg via INTRAVENOUS
  Filled 2017-10-05 (×4): qty 0.2

## 2017-10-05 MED ORDER — IOPAMIDOL (ISOVUE-370) INJECTION 76%
INTRAVENOUS | Status: AC
  Filled 2017-10-05: qty 50

## 2017-10-05 MED ORDER — TRAZODONE HCL 100 MG PO TABS: 100.0000 mg | ORAL_TABLET | Freq: Every day | ORAL | Status: DC

## 2017-10-05 MED ORDER — IOPAMIDOL (ISOVUE-370) INJECTION 76%
50.0000 mL | Freq: Once | INTRAVENOUS | Status: AC | PRN
  Administered 2017-10-05: 50 mL via INTRAVENOUS

## 2017-10-05 MED ORDER — HEPARIN SODIUM (PORCINE) 5000 UNIT/ML IJ SOLN
5000.0000 [IU] | Freq: Three times a day (TID) | INTRAMUSCULAR | Status: DC
  Administered 2017-10-05 – 2017-10-10 (×15): 5000 [IU] via SUBCUTANEOUS
  Filled 2017-10-05 (×14): qty 1

## 2017-10-05 MED ORDER — THIAMINE HCL 100 MG/ML IJ SOLN
400.0000 mg | Freq: Once | INTRAMUSCULAR | Status: AC
  Administered 2017-10-05: 400 mg via INTRAVENOUS
  Filled 2017-10-05: qty 4

## 2017-10-05 MED ORDER — FOLIC ACID 1 MG PO TABS: 1.0000 mg | ORAL_TABLET | Freq: Every day | ORAL | Status: DC

## 2017-10-05 MED ORDER — SODIUM CHLORIDE 0.9 % IV SOLN
INTRAVENOUS | Status: DC
  Administered 2017-10-05 – 2017-10-06 (×5): via INTRAVENOUS

## 2017-10-05 MED ORDER — HYDRALAZINE HCL 20 MG/ML IJ SOLN
10.0000 mg | Freq: Three times a day (TID) | INTRAMUSCULAR | Status: DC | PRN
  Administered 2017-10-08: 10 mg via INTRAVENOUS
  Filled 2017-10-05: qty 1

## 2017-10-05 MED ORDER — LEVETIRACETAM IN NACL 1000 MG/100ML IV SOLN
1000.0000 mg | Freq: Once | INTRAVENOUS | Status: AC
  Administered 2017-10-05: 1000 mg via INTRAVENOUS
  Filled 2017-10-05: qty 100

## 2017-10-05 MED ORDER — THIAMINE HCL 100 MG/ML IJ SOLN
500.0000 mg | Freq: Three times a day (TID) | INTRAVENOUS | Status: AC
  Administered 2017-10-05 – 2017-10-07 (×5): 500 mg via INTRAVENOUS
  Filled 2017-10-05 (×5): qty 5

## 2017-10-05 MED ORDER — VITAMIN B-1 100 MG PO TABS: 100.0000 mg | ORAL_TABLET | Freq: Every day | ORAL | Status: DC

## 2017-10-05 MED ORDER — METOPROLOL TARTRATE 5 MG/5ML IV SOLN
5.0000 mg | Freq: Three times a day (TID) | INTRAVENOUS | Status: DC
  Administered 2017-10-05 – 2017-10-08 (×10): 5 mg via INTRAVENOUS
  Filled 2017-10-05 (×10): qty 5

## 2017-10-05 MED ORDER — CITALOPRAM HYDROBROMIDE 40 MG PO TABS: 40.0000 mg | ORAL_TABLET | Freq: Every day | ORAL | Status: DC

## 2017-10-05 MED ORDER — SODIUM CHLORIDE 0.9% FLUSH
3.0000 mL | Freq: Two times a day (BID) | INTRAVENOUS | Status: DC
  Administered 2017-10-05 – 2017-10-10 (×8): 3 mL via INTRAVENOUS

## 2017-10-05 NOTE — H&P (Signed)
History and Physical    Betty Nelson ZOX:[id number] DOB: 05-31-1960 DOA: 10/05/2017  PCP: Remus Loffler, PA-C   I have briefly reviewed patients previous medical reports in [facility].  Patient coming from: Home   Chief Complaint: AMS and Seizure  HPI: Betty Nelson Betty Nelson is a 57 y/o with PMH significant for HTN, depression/anxiety, protein calorie malnutrition and alcohol abuse. Patient was brought to ED due to AMS and witnessed seizure activity by family. Patient is confused and obtunded on exam and unable to provide any history. Family reported no eating much lately; but they deny fever, chills, focal weakness, coughing spells, vomiting, diarrhea, melena, hematochezia or any other complaints.  On the day of admission family noticed tonic clonic activity and following that has been confused. On arrival by EMS CBG's stable and patient had a second tonic clonic event (now witnessed by EMS personal); patient had urine incontinence with this event; she received versed and was transported to ED for further evaluation and management.  Of note; per family last alcohol use was on Thursday; patient has never experienced withdrawal symptoms before according to family reports.  ED Course: In ED patient received 1 loading dose of Keppra and had CT scan of head (which was neg for bleeding or acute abnormalities; there was advance atrophy for age). Patient remained very agitated and also had right sided weakness and flaccid state. Able to protect airways, but no following commands. Haldol given for agitation.   Review of Systems:  Unable to assess given AMS.  Past Medical History:  Diagnosis Date  . Anxiety   . DDD (degenerative disc disease), lumbar   . DDD (degenerative disc disease), lumbar   . Depression   . Disability of walking   . Hypertension     History reviewed. No pertinent surgical history.  Social History  reports that she has been smoking.  She has never used smokeless  tobacco. She reports that she drinks about 1.8 - 3.6 oz of alcohol per week. She reports that she does not use drugs.  No Known Allergies  Family History  Problem Relation Age of Onset  . Heart disease Mother   . Hyperlipidemia Mother   . Heart disease Father   . Hyperlipidemia Father     Prior to Admission medications   Medication Sig Start Date End Date Taking? Authorizing Provider  ALPRAZolam Prudy Feeler) 0.5 MG tablet Take 1 Tablet by mouth 2 times a day as needed 08/18/17   Remus Loffler, PA-C  citalopram (CELEXA) 20 MG tablet TAKE 1 TABLET BY MOUTH EVERY DAY 09/02/17   Remus Loffler, PA-C  citalopram (CELEXA) 40 MG tablet Take 1 tablet (40 mg total) by mouth daily. 08/18/17   Remus Loffler, PA-C  cyclobenzaprine (FLEXERIL) 10 MG tablet Take 1 tablet (10 mg total) by mouth 2 (two) times daily. 08/18/17   Remus Loffler, PA-C  HYDROcodone-acetaminophen (NORCO) 10-325 MG tablet Take 1-2 tablets by mouth every 6 (six) hours as needed. 08/18/17   Remus Loffler, PA-C  HYDROcodone-acetaminophen (NORCO) 10-325 MG tablet Take 1-2 tablets by mouth every 6 (six) hours as needed. 08/18/17   Remus Loffler, PA-C  HYDROcodone-acetaminophen (NORCO) 10-325 MG tablet Take 1-2 tablets by mouth every 6 (six) hours as needed. 08/18/17   Remus Loffler, PA-C  loratadine (CLARITIN) 10 MG tablet Take 1 tablet (10 mg total) by mouth daily. 05/20/17   Remus Loffler, PA-C  megestrol (MEGACE) 40 MG tablet TAKE 1 TABLET  BY MOUTH 2 TIMES A DAY 07/02/17   Remus Loffler, PA-C  traZODone (DESYREL) 100 MG tablet TAKE 1 TABLET BY MOUTH EVERY DAY AT BEDTIME AS NEEDED FOR SLEEP 08/04/17   Remus Loffler, PA-C    Physical Exam: Vitals:   10/05/17 0739 10/05/17 0742 10/05/17 1009  BP:  (!) 188/126 (!) 174/117  Pulse:  (!) 120 (!) 113  Resp:  20 (!) 23  SpO2:  100% 100%  Weight: 45.4 kg (100 lb)    Height: 5\' 3"  (1.6 m)      Constitutional: obtunded, moving four limbs, not following commands; protecting airways. Patient  appears a lot older than stated age and is underweight.  Eyes: lids and conjunctivae normal, no icterus, no nystagmus. ENMT: Mucous membranes are moist. Posterior pharynx clear of any exudate or lesions. Poor dentition.  Neck: supple, no masses, no thyromegaly, no JVD Respiratory: clear to auscultation bilaterally, no wheezing, no crackles. Normal respiratory effort. No accessory muscle use.  Cardiovascular: tachycardic, no murmurs / rubs / gallops. No extremity edema. 2+ pedal pulses. No carotid bruits.  Abdomen: No distension, no tenderness, no masses palpated. No hepatosplenomegaly. Bowel sounds normal.  Musculoskeletal: no clubbing / cyanosis. No joint deformity upper and lower extremities. Right side is slightly weaker than left; but patient spontaneously moving it. Muscular tone appears normal now.  Skin: no rashes, lesions, ulcers. No induration Neurologic: CN appears intact, moving four limbs spontaneously, not following commands.  Psychiatric: agitated, not able to properly assess mood with current mentation.   Labs on Admission: I have personally reviewed following labs and imaging studies  CBC: Recent Labs  Lab 10/05/17 0903  WBC 9.0  NEUTROABS 7.7  HGB 15.1*  HCT 43.4  MCV 94.8  PLT 172   Basic Metabolic Panel: Recent Labs  Lab 10/05/17 0903  NA 131*  K 4.0  CL 95*  CO2 19*  GLUCOSE 162*  BUN 7  CREATININE 0.74  CALCIUM 9.1   Liver Function Tests: Recent Labs  Lab 10/05/17 0903  AST 58*  ALT 28  ALKPHOS 63  BILITOT 1.2  PROT 8.8*  ALBUMIN 4.6   HbA1C: No results for input(s): HGBA1C in the last 72 hours.   CBG: Recent Labs  Lab 10/05/17 0743  GLUCAP 175*   Urine analysis:    Component Value Date/Time   COLORURINE YELLOW 10/05/2017 0819   APPEARANCEUR CLEAR 10/05/2017 0819   LABSPEC 1.012 10/05/2017 0819   PHURINE 6.0 10/05/2017 0819   GLUCOSEU 50 (A) 10/05/2017 0819   HGBUR SMALL (A) 10/05/2017 0819   BILIRUBINUR NEGATIVE 10/05/2017 0819     KETONESUR NEGATIVE 10/05/2017 0819   PROTEINUR >=300 (A) 10/05/2017 0819   NITRITE NEGATIVE 10/05/2017 0819   LEUKOCYTESUR NEGATIVE 10/05/2017 0819     Radiological Exams on Admission: Dg Chest 1 View  Result Date: 10/05/2017 CLINICAL DATA:  Siezure, unresponsive, unable to cooperate, held for film EXAM: CHEST  1 VIEW COMPARISON:  none FINDINGS: Lungs are clear. Heart size and mediastinal contours are within normal limits. Aortic Atherosclerosis (ICD10-170.0). No effusion. Visualized bones unremarkable. IMPRESSION: No acute cardiopulmonary disease. Electronically Signed   By: Corlis Leak M.D.   On: 10/05/2017 08:29   Ct Head Wo Contrast  Result Date: 10/05/2017 CLINICAL DATA:  walking and collapsed stiff with possible seizure activity. Pt had seizure activity en route to hospital with ems and was incontinent of urine. Given Versed 2.5mg  for this. Pt is neglecting right side of body with no verbal response. EXAM:  CT HEAD WITHOUT CONTRAST TECHNIQUE: Contiguous axial images were obtained from the base of the skull through the vertex without intravenous contrast. Technologist notes Multiple attempts to scan patient with multiple restraints tried before diagnostic exam achieved./ COMPARISON:  None. FINDINGS: Brain: Mild atrophy. There is no evidence of acute intracranial hemorrhage, brain edema, mass lesion, acute infarction, mass effect, or midline shift. Acute infarct may be inapparent on noncontrast CT. No other intra-axial abnormalities are seen, and the ventricles and sulci are within normal limits in size and symmetry. No abnormal extra-axial fluid collections or masses are identified. Some continued motion degradation. Vascular: No hyperdense vessel or unexpected calcification. Skull: Normal. Negative for fracture or focal lesion. Sinuses/Orbits: Hypoplastic frontal sinuses.  Otherwise negative. Other: None. IMPRESSION: Atrophy.  No bleed or other acute intracranial process. Electronically Signed    By: Corlis Leak  Hassell M.D.   On: 10/05/2017 08:29   Assessment/Plan 1-AMS/seizure -CT head neg -high concerns for withdrawal, given prior hx of alcohol abuse -admit to stepdown, start CIWA protocol and keep NPO until mentation improved, to minimize chances of aspiration  -seizure precaution  -neurology consulted -EEG and MRI ordered. -no further AED's as per neurology recommendations -thiamine and folic acid ordered.    2-right side weakness/flaccid state -post seizure weakness/flaccid state suggesting Todd's paralysis -will check potassium, Magnesium and Phosphorus -following neurology recommendations will check CT angio head and neck to r/o out large vessel occlusion and presumption of stroke.  3-alcohol abuse -will need cessation counseling  -started on CIWA protocol  -thiamine and folic acid ordered   4-depression and anxiety -continue trazodone and celexa -will receive PRN ativan by CIWA protocol    5-HTN -started on IV lopressor -labetalol X 1 given in ED -PRN hydralazine ordered   6-dehydration and concerns for mild rhabdomyolysis  -started on IVF's -checking CK level and lactic acid  -no signs or reports of infection  7-severe protein calorie malnutrition  -nutritional service consulted -will need feeding supplements -was on Megace at home    DVT prophylaxis: heparin  Code Status: Full Family Communication: daughter at bedside   [facility] and further management. Consults called: Dr. Amada JupiterKirkpatrick (neurology) Admission status: inpatient, stepdown, LOS > 2 midnights   Time: 70 minutes  Vassie Lollarlos Casie Sturgeon MD [facility] Pager [phone]  If 7PM-7AM, please contact night-coverage [email or web address] Password [email or web address]  10/05/2017, 10:49 AM

## 2017-10-05 NOTE — ED Provider Notes (Addendum)
[facility] EMERGENCY DEPARTMENT Provider Note   CSN: [id number] Arrival date & time: 10/05/17  7829     History   Chief Complaint Chief Complaint  Patient presents with  . Seizures    HPI Betty Nelson is a 57 y.o. female.  HPI   She presents for evaluation of suspected seizure.  During transport she had a second seizure, and was noted to have right-sided flaccidity afterwards.  She was treated with Versed around the time of the seizure by EMS.  Family members were unable to give history to EMS.  Level 5 caveat-altered mental status  Past Medical History:  Diagnosis Date  . Anxiety   . DDD (degenerative disc disease), lumbar   . DDD (degenerative disc disease), lumbar   . Depression   . Disability of walking   . Hypertension     Patient Active Problem List   Diagnosis Date Noted  . 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 Alcohol abuse 10/05/2017  . Protein calorie malnutrition (HCC) 10/05/2017  . Depression 10/05/2017  . Encephalopathy   . Allergic rhinitis 05/20/2017  . Primary insomnia 11/18/2016  . DDD (degenerative disc disease), lumbar 05/20/2016  . Moderate episode of recurrent major depressive disorder (HCC) 05/20/2016  . Essential hypertension 05/20/2016    History reviewed. No pertinent surgical history.   OB History   None      Home Medications    Prior to Admission medications   Medication Sig Start Date End Date Taking? Authorizing Provider  ALPRAZolam Prudy Feeler) 0.5 MG tablet Take 1 Tablet by mouth 2 times a day as needed 08/18/17   Remus Loffler, PA-C  citalopram (CELEXA) 20 MG tablet TAKE 1 TABLET BY MOUTH EVERY DAY 09/02/17   Remus Loffler, PA-C  citalopram (CELEXA) 40 MG tablet Take 1 tablet (40 mg total) by mouth daily. 08/18/17   Remus Loffler, PA-C  cyclobenzaprine (FLEXERIL) 10 MG tablet Take 1 tablet (10 mg total) by mouth 2 (two) times daily. 08/18/17   Remus Loffler, PA-C  HYDROcodone-acetaminophen (NORCO) 10-325 MG tablet Take 1-2  tablets by mouth every 6 (six) hours as needed. 08/18/17   Remus Loffler, PA-C  loratadine (CLARITIN) 10 MG tablet Take 1 tablet (10 mg total) by mouth daily. 05/20/17   Remus Loffler, PA-C  megestrol (MEGACE) 40 MG tablet TAKE 1 TABLET BY MOUTH 2 TIMES A DAY 07/02/17   Remus Loffler, PA-C  traZODone (DESYREL) 100 MG tablet TAKE 1 TABLET BY MOUTH EVERY DAY AT BEDTIME AS NEEDED FOR SLEEP 08/04/17   Remus Loffler, PA-C    Family History Family History  Problem Relation Age of Onset  . Heart disease Mother   . Hyperlipidemia Mother   . Heart disease Father   . Hyperlipidemia Father     Social History Social History   Tobacco Use  . Smoking status: Current Every Day Smoker  . Smokeless tobacco: Never Used  Substance Use Topics  . Alcohol use: Yes    Alcohol/week: 1.8 - 3.6 oz    Types: 3 - 6 Cans of beer per week  . Drug use: No     Allergies   Patient has no known allergies.   Review of Systems Review of Systems  All other systems reviewed and are negative.    Physical Exam Updated Vital Signs BP (!) 201/112   Pulse (!) 103   Temp 98.6 F (37 C) (Axillary)   Resp (!) 24   Ht 5\' 3"  (  1.6 m)   Wt 45.4 kg (100 lb)   SpO2 100%   BMI 17.71 kg/m   Physical Exam  Constitutional: She appears well-developed. She appears distressed.  Appears under nourished, appears older than stated age.  HENT:  Head: Normocephalic and atraumatic.  Eyes: Pupils are equal, round, and reactive to light. Conjunctivae and EOM are normal.  Neck: Normal range of motion and phonation normal. Neck supple.  Cardiovascular: Regular rhythm.  Tachycardic  Pulmonary/Chest: Effort normal and breath sounds normal. No respiratory distress. She has no wheezes. She exhibits no tenderness.  There is no increased work of breathing.  There is no stridor.  Abdominal: Soft. She exhibits no distension. There is no tenderness. There is no guarding.  Musculoskeletal: She exhibits no edema or deformity.    Neurological: GCS eye subscore is 1. GCS verbal subscore is 1. GCS motor subscore is 5.  Right facial droop is present.  Flaccid right arm.  Normal tone right leg, but no certain movement.  Skin: Skin is warm and dry.  Psychiatric:  Obtunded  Nursing note and vitals reviewed.    ED Treatments / Results  Labs (all labs ordered are listed, but only abnormal results are displayed) Labs Reviewed  COMPREHENSIVE METABOLIC PANEL - Abnormal; Notable for the following components:      Result Value   Sodium 131 (*)    Chloride 95 (*)    CO2 19 (*)    Glucose, Bld 162 (*)    Total Protein 8.8 (*)    AST 58 (*)    Anion gap 17 (*)    All other components within normal limits  CBC WITH DIFFERENTIAL/PLATELET - Abnormal; Notable for the following components:   Hemoglobin 15.1 (*)    RDW 19.4 (*)    All other components within normal limits  URINALYSIS, ROUTINE W REFLEX MICROSCOPIC - Abnormal; Notable for the following components:   Glucose, UA 50 (*)    Hgb urine dipstick SMALL (*)    Protein, ur >=300 (*)    All other components within normal limits  RAPID URINE DRUG SCREEN, HOSP PERFORMED - Abnormal; Notable for the following components:   Opiates POSITIVE (*)    Benzodiazepines POSITIVE (*)    Barbiturates   (*)    Value: Result not available. Reagent lot number recalled by manufacturer.   All other components within normal limits  CBC - Abnormal; Notable for the following components:   RBC 3.75 (*)    HCT 34.2 (*)    RDW 18.2 (*)    All other components within normal limits  MAGNESIUM - Abnormal; Notable for the following components:   Magnesium 1.3 (*)    All other components within normal limits  LACTIC ACID, PLASMA - Abnormal; Notable for the following components:   Lactic Acid, Venous 3.4 (*)    All other components within normal limits  CBG MONITORING, ED - Abnormal; Notable for the following components:   Glucose-Capillary 175 (*)    All other components within normal  limits  ETHANOL  TSH  CREATININE, SERUM  PHOSPHORUS  VITAMIN B12  HIV ANTIBODY (ROUTINE TESTING)  VITAMIN B1    EKG None  Radiology Dg Chest 1 View  Result Date: 10/05/2017 CLINICAL DATA:  Siezure, unresponsive, unable to cooperate, held for film EXAM: CHEST  1 VIEW COMPARISON:  none FINDINGS: Lungs are clear. Heart size and mediastinal contours are within normal limits. Aortic Atherosclerosis (ICD10-170.0). No effusion. Visualized bones unremarkable. IMPRESSION: No acute cardiopulmonary disease. Electronically  Signed   By: Corlis Leak M.D.   On: 10/05/2017 08:29   Ct Head Wo Contrast  Result Date: 10/05/2017 CLINICAL DATA:  walking and collapsed stiff with possible seizure activity. Pt had seizure activity en route to hospital with ems and was incontinent of urine. Given Versed 2.5mg  for this. Pt is neglecting right side of body with no verbal response. EXAM: CT HEAD WITHOUT CONTRAST TECHNIQUE: Contiguous axial images were obtained from the base of the skull through the vertex without intravenous contrast. Technologist notes Multiple attempts to scan patient with multiple restraints tried before diagnostic exam achieved./ COMPARISON:  None. FINDINGS: Brain: Mild atrophy. There is no evidence of acute intracranial hemorrhage, brain edema, mass lesion, acute infarction, mass effect, or midline shift. Acute infarct may be inapparent on noncontrast CT. No other intra-axial abnormalities are seen, and the ventricles and sulci are within normal limits in size and symmetry. No abnormal extra-axial fluid collections or masses are identified. Some continued motion degradation. Vascular: No hyperdense vessel or unexpected calcification. Skull: Normal. Negative for fracture or focal lesion. Sinuses/Orbits: Hypoplastic frontal sinuses.  Otherwise negative. Other: None. IMPRESSION: Atrophy.  No bleed or other acute intracranial process. Electronically Signed   By: Corlis Leak M.D.   On: 10/05/2017 08:29     Procedures .Critical Care Performed by: Mancel Bale, MD Authorized by: Mancel Bale, MD   Critical care provider statement:    Critical care time (minutes):  50   Critical care start time:  10/05/2017 7:46 AM   Critical care end time:  10/05/2017 9:54 AM   Critical care time was exclusive of:  Separately billable procedures and treating other patients   Critical care was necessary to treat or prevent imminent or life-threatening deterioration of the following conditions:  CNS failure or compromise   Critical care was time spent personally by me on the following activities:  Blood draw for specimens, development of treatment plan with patient or surrogate, discussions with consultants, evaluation of patient's response to treatment, examination of patient, obtaining history from patient or surrogate, ordering and performing treatments and interventions, ordering and review of laboratory studies, pulse oximetry, re-evaluation of patient's condition, review of old charts and ordering and review of radiographic studies   (including critical care time)  Medications Ordered in ED Medications  0.9 %  sodium chloride infusion ( Intravenous New Bag/Given 10/05/17 1408)  ondansetron (ZOFRAN-ODT) 4 MG disintegrating tablet (0 mg  Hold 10/05/17 1322)  loratadine (CLARITIN) tablet 10 mg (10 mg Oral Not Given 10/05/17 1415)  hydrALAZINE (APRESOLINE) injection 10 mg (has no administration in time range)  metoprolol tartrate (LOPRESSOR) injection 5 mg (5 mg Intravenous Given 10/05/17 1340)  heparin injection 5,000 Units (5,000 Units Subcutaneous Given 10/05/17 1340)  sodium chloride flush (NS) 0.9 % injection 3 mL (3 mLs Intravenous Given 10/05/17 1400)  ondansetron (ZOFRAN) tablet 4 mg (has no administration in time range)    Or  ondansetron (ZOFRAN) injection 4 mg (has no administration in time range)  LORazepam (ATIVAN) injection 2-3 mg (2 mg Intravenous Given 10/05/17 1515)  thiamine (VITAMIN B-1)  tablet 100 mg (100 mg Oral Not Given 10/05/17 1415)  folic acid injection 1 mg (1 mg Intravenous Given 10/05/17 1513)  iopamidol (ISOVUE-370) 76 % injection (has no administration in time range)  sodium chloride 0.9 % bolus 500 mL (0 mLs Intravenous Stopped 10/05/17 1041)  levETIRAcetam (KEPPRA) IVPB 1000 mg/100 mL premix (0 mg Intravenous Stopped 10/05/17 1002)  haloperidol lactate (HALDOL) injection 2 mg (2  mg Intravenous Given 10/05/17 0945)  haloperidol lactate (HALDOL) injection 2 mg (2 mg Intravenous Given 10/05/17 1051)  thiamine (B-1) injection 100 mg (100 mg Intravenous Given 10/05/17 1050)  labetalol (NORMODYNE,TRANDATE) injection 20 mg (20 mg Intravenous Given 10/05/17 1231)     Initial Impression / Assessment and Plan / ED Course  I have reviewed the triage vital signs and the nursing notes.  Pertinent labs & imaging results that were available during my care of the patient were reviewed by me and considered in my medical decision making (see chart for details).  Clinical Course as of Oct 05 1544  Sun Oct 05, 2017  7846 Patient is currently sedated, following administration of Versed by EMS.  Low GCS, 7 however she appears to be controlling her airway and not in respiratory distress.  Therefore the patient will not be intubated at this time we will proceed with work-up and reassess closely.   [EW]  [id number] Patient has become more alert, responding to stimuli, pulled out her IV, and crying out.  She had both vomiting and diarrhea.  She continues to control her airway.  She is moving all extremities equally at this time.  Patient continues to not answer questions.  She appears encephalopathic.  Keppra ordered for seizures, and Haldol ordered for sedation.   [EW]  0950 Normal except sodium low, chloride low, CO2 low, glucose high  Comprehensive metabolic panel(!) [EW]  0950 Normal  Ethanol [EW]  0950 Normal except hemoglobin high at 50.1 indicating dehydration  CBC with Differential(!) [EW]   0950 Abnormal, opiates and benzodiazepines are present.  Urine rapid drug screen (hosp performed)(!) [EW]  0950 Normal except glucose present, hemoglobin small, protein elevated.  Urinalysis, Routine w reflex microscopic(!) [EW]  [id number] No acute disease, images reviewed  DG Chest 1 View [EW]  [phone] No acute changes, images reviewed  CT Head Wo Contrast [EW]  1020 She remains confused and crying out, not redirectable, 35 minutes after first dose of Haldol.  Will give second dose of Haldol 2 mg.   [EW]  1043 Thiamine given for consideration of Warnicke's encephalopathy.  We will also give trial of Ativan if needed, to help with agitation.   [EW]    Clinical Course User Index [EW] Mancel Bale, MD     Patient Vitals for the past 24 hrs:  BP Temp Temp src Pulse Resp SpO2 Height Weight  10/05/17 1326 - 98.6 F (37 C) Axillary (!) 103 - 100 % - -  10/05/17 1200 (!) 201/112 - - - (!) 24 - - -  10/05/17 1100 - - - (!) 119 (!) 22 98 % - -  10/05/17 1009 (!) 174/117 - - (!) 113 (!) 23 100 % - -  10/05/17 0900 (!) 197/125 - - (!) 115 19 100 % - -  10/05/17 0742 (!) 188/126 - - (!) 120 20 100 % - -  10/05/17 0739 - 99.4 F (37.4 C) Rectal - - - 5\' 3"  (1.6 m) 45.4 kg (100 lb)    3:46 PM Reevaluation with update and discussion. After initial assessment and treatment, an updated evaluation reveals she continues to be confused, and crying out sporadically.Mancel Bale   Medical Decision Making: Presenting after seizure with right-sided weakness, gradually improved in the ED.  This is consistent with Todd's paralysis post seizure.  Patient reportedly is a drinker with negative alcohol level, suspect alcohol withdrawal seizure.  Patient treated with Keppra since she cannot give additional history and there was  a significant seizure.  Patient required treatment with Haldol, for agitation in the ED.  She appears to be encephalopathic, or delirious and will require admission for  management.  Nursing Notes Reviewed/ Care Coordinated Applicable Imaging Reviewed Interpretation of Laboratory Data incorporated into ED treatment  CRITICAL CARE- yes Performed by: Mancel BaleElliott Ziara Thelander  9:53 AM-Consult complete with hospitalist. Patient case explained and discussed.  He agrees to admit patient for further evaluation and treatment. Call ended at 10:15 AM  Final Clinical Impressions(s) / ED Diagnoses   Final diagnoses:  Seizure (HCC)  Todd's paralysis [facility](HCC)  Encephalopathy    ED Discharge Orders    None       Mancel BaleWentz, Kayron Hicklin, MD 10/05/17 1021    Mancel BaleWentz, Tieshia Rettinger, MD 10/05/17 1546

## 2017-10-05 NOTE — Progress Notes (Signed)
Axiallary temp 100.3.F, Dr. Petra KubaKilpatrick notified, at bedside.

## 2017-10-05 NOTE — Progress Notes (Signed)
STAT EEG completed; results pending. 

## 2017-10-05 NOTE — Progress Notes (Signed)
Pt arrived to unit, very agitated at this time, PRN ativan admin per order. MD at bedside at this time.

## 2017-10-05 NOTE — Progress Notes (Addendum)
[facility] where [facility] for suspected seizures.  [facility] pending.  She apparently presented to the emergency room due to suspected seizure. She is quite encephalopathic and agitated so she cannot contribute to the story.  I was able to call and discuss with patient's husband Jerilynn SomCalvin Ethridge over the phone.  He tells me that last night she was her normal self, when he went to bed she was up and walking around.  Early this morning, patient's daughter found her lying on the floor and what appeared to have seizure activity.  They called EMS and on transport to the ED she had a repeat seizure.  She has received Versed by EMS.  Patient's husband tells me that she has been having slight memory problems over the last 4 to 5 weeks.  Since Thursday, he has noticed her to be a little bit less interactive, decreased appetite, and has not had anything to drink since that day.  He tells me that she drinks usually every other day about 4-5 drinks.  She is also taking Xanax and hydrocodone, husband is not sure how much or whether she ran out.  BP (!) 201/112   Pulse (!) 103   Temp 98.6 F (37 C) (Axillary)   Resp (!) 24   Ht 5\' 3"  (1.6 m)   Wt 45.4 kg (100 lb)   SpO2 100%   BMI 17.71 kg/m   Constitutional: Appears restless, mittens on her hands, not following commands Eyes: PERRL, no scleral icterus noted ENMT: Mucous membranes are moist.  Respiratory: clear to auscultation bilaterally, no wheezing, no crackles. Normal respiratory effort.  Cardiovascular: Regular rate and rhythm, no murmurs / rubs / gallops. No LE edema.  Abdomen: no tenderness. Bowel sounds positive.  Skin: no rashes, few abrasions on anterior shins bilaterally Neurologic: Appears to have right-sided neglect, she withdraws to pain on the left lower extremity but not as much in the right lower extremity.  Bilateral upper extremities appear equal strength, she does not follow  commands however seems to be moving them independently  Labs reviewed showed mild hyponatremia sodium of 131, chloride 95, bicarb 19, increased anion gap to 17, glucose is 162.  LFTs showed slight elevation of the AST greater than ALT, normal bilirubin.  CBC is unremarkable.  Plan  Seizures -Possibly with Todd paralysis given right-sided weakness following the seizures, unclear precipitant, UDS is positive for benzodiazepines so unlikely that she ran out of Xanax, possibly related to alcohol withdrawal since she has not had anything to drink in the last 3 days. -Loaded with Keppra in the ED, neurology consulted, Dr. Amada JupiterKirkpatrick to see -MRI of the brain and CT angiograms are pending -defer EEG to neurology  Alcohol abuse -Placed on CIWA, continue, slight elevation of the LFTs are in a pattern consistent with ongoing alcohol use  Increased anion gap metabolic acidosis -Likely due to seizures, will check a lactic acid  Memory problems /increased weakness /poor appetite over the last few days -Unclear cause,?  Related to home medications, does not appear to have any ongoing infections.  TSH normal. Check B1 / B12 given alcohol abuse  Acute encephalopathy -With agitation/delirium, safety sitter at bedside.  Keep strict n.p.o., discontinue oral medications  Hypochloremic hyponatremia -Appears dehydrated, patient with poor p.o. intake over the last 3 to 4 days, continue IV fluids   Lahna Nath M. Elvera LennoxGherghe, MD [facility] [phone]  If 7PM-7AM, please contact night-coverage [email or web address] Password [email or web address]

## 2017-10-05 NOTE — Procedures (Signed)
Indication: 57 year old female with altered mental status  Risks of the procedure were dicussed with the patient including post-LP headache, bleeding, infection, weakness/numbness of legs(radiculopathy), death.  The patient/patient's proxy agreed and written consent was obtained.   The patient was prepped and draped, and using sterile technique a 20 gauge quinke spinal needle was inserted in the L4-5 space.  After several attempts were met with bony resistance, clear CSF was obtained.  Opening pressure was not obtained.  Approximately 5 cc of CSF were obtained and sent for analysis.

## 2017-10-05 NOTE — Consult Note (Addendum)
NEUROLOGY CONSULT  Reason for Consult: Encephalopathy; Seizure Referring Physician: Dr Elvera Lennox  CC: unable to give c/o HPI: Betty Nelson is an 57 y.o. female who presented to [facility] early this am after family witnessed a seizure, described as stiffness all over, followed by unresponsivness. During EMS transport to ER they witnessed another seizure and she was incontinent of urine. No further information about the type of seizure activity or how long it lasted. Per report, the pt is a heavy alcohol drinker, but had not been feeling well over last two days, so was not drinking or eating. This could have provoked a withdraw seizure. Additionally, family report that she seemed to be confused which is not her baseline. No report of headache.   On arrival to the emergency department in any pain, she had a dense right hemiparesis which gradually improved.  Past Medical History Past Medical History:  Diagnosis Date  . Anxiety   . DDD (degenerative disc disease), lumbar   . DDD (degenerative disc disease), lumbar   . Depression   . Disability of walking   . Hypertension     Past Surgical History History reviewed. No pertinent surgical history.  Family History Family History  Problem Relation Age of Onset  . Heart disease Mother   . Hyperlipidemia Mother   . Heart disease Father   . Hyperlipidemia Father     Social History   Reports that she has been smoking daily.  She has never used smokeless tobacco. Family reports "heavy" daily ETOH consumption. Sister states she drinks "any ETOH she can get her hands on". No report of drugs.  Allergies No Known Allergies  Home Medications Medications Prior to Admission  Medication Sig Dispense Refill  . ALPRAZolam (XANAX) 0.5 MG tablet Take 1 Tablet by mouth 2 times a day as needed 60 tablet 5  . citalopram (CELEXA) 20 MG tablet TAKE 1 TABLET BY MOUTH EVERY DAY 90 tablet 0  . citalopram (CELEXA) 40 MG tablet Take 1 tablet (40 mg  total) by mouth daily. 30 tablet 5  . cyclobenzaprine (FLEXERIL) 10 MG tablet Take 1 tablet (10 mg total) by mouth 2 (two) times daily. 60 tablet 5  . HYDROcodone-acetaminophen (NORCO) 10-325 MG tablet Take 1-2 tablets by mouth every 6 (six) hours as needed. 240 tablet 0  . loratadine (CLARITIN) 10 MG tablet Take 1 tablet (10 mg total) by mouth daily. 30 tablet 11  . megestrol (MEGACE) 40 MG tablet TAKE 1 TABLET BY MOUTH 2 TIMES A DAY 180 tablet 1  . traZODone (DESYREL) 100 MG tablet TAKE 1 TABLET BY MOUTH EVERY DAY AT BEDTIME AS NEEDED FOR SLEEP 30 tablet 11    Hospital Medications . folic acid  1 mg Intravenous Daily  . heparin  5,000 Units Subcutaneous Q8H  . loratadine  10 mg Oral Daily  . metoprolol tartrate  5 mg Intravenous Q8H  . ondansetron      . sodium chloride flush  3 mL Intravenous Q12H  . thiamine  100 mg Oral Daily     ROS: Unable d/t encephalopathy   Physical Examination:  Vitals:   10/05/17 1009 10/05/17 1100 10/05/17 1200 10/05/17 1326  BP: (!) 174/117  (!) 201/112   Pulse: (!) 113 (!) 119  (!) 103  Resp: (!) 23 (!) 22 (!) 24   Temp:    98.6 F (37 C)  TempSrc:    Axillary  SpO2: 100% 98%  100%  Weight:  Height:        General - chronically ill, frail, appears older than stated age.  Heart - Regular rate and rhythm - no murmer Lungs - Clear to auscultation Abdomen - Soft - non tender Extremities - Distal pulses intact - no edema Skin - Warm and very dry flaking skin  Neurologic Examination:   Mental Status:  Not alert or attentive to examiner. Doesn't open eyes to noxious stimuli, but will grimmace. She is restless and agitated. Mumbles incomprehensibly. DNFC. Cranial Nerves:  There is a downward disconjugate gaze with passive eye opening,PERRL, 3mm. +blink to threat. She makes no attempt to track. Face appears fairly symmetric, but poorly activated and she is not cooperative with exam.  Motor: Freely moving all ext in bed against gravity.Tone  and bulk:normal tone throughout; no atrophy noted Sensory: Intact to noxious stim, but seems less brisk on right LE and UE than left Deep Tendon Reflexes: wnl Plantars: mute Cerebellar: unable Gait: not tested   LABORATORY STUDIES:  Basic Metabolic Panel: Recent Labs  Lab 10/05/17 0903  NA 131*  K 4.0  CL 95*  CO2 19*  GLUCOSE 162*  BUN 7  CREATININE 0.74  CALCIUM 9.1    Liver Function Tests: Recent Labs  Lab 10/05/17 0903  AST 58*  ALT 28  ALKPHOS 63  BILITOT 1.2  PROT 8.8*  ALBUMIN 4.6   No results for input(s): LIPASE, AMYLASE in the last 168 hours. No results for input(s): AMMONIA in the last 168 hours.  CBC: Recent Labs  Lab 10/05/17 0903  WBC 9.0  NEUTROABS 7.7  HGB 15.1*  HCT 43.4  MCV 94.8  PLT 172    Cardiac Enzymes: No results for input(s): CKTOTAL, CKMB, CKMBINDEX, TROPONINI in the last 168 hours.  BNP: Invalid input(s): POCBNP  CBG: Recent Labs  Lab 10/05/17 0743  GLUCAP 175*    Microbiology:   Coagulation Studies: No results for input(s): LABPROT, INR in the last 72 hours.  Urinalysis:  Recent Labs  Lab 10/05/17 0819  COLORURINE YELLOW  LABSPEC 1.012  PHURINE 6.0  GLUCOSEU 50*  HGBUR SMALL*  BILIRUBINUR NEGATIVE  KETONESUR NEGATIVE  PROTEINUR >=300*  NITRITE NEGATIVE  LEUKOCYTESUR NEGATIVE    Lipid Panel:  No results found for: CHOL, TRIG, HDL, CHOLHDL, VLDL, LDLCALC  HgbA1C:  No results found for: HGBA1C  Urine Drug Screen:      Component Value Date/Time   LABOPIA POSITIVE (A) 10/05/2017 0819   COCAINSCRNUR NONE DETECTED 10/05/2017 0819   LABBENZ POSITIVE (A) 10/05/2017 0819   AMPHETMU NONE DETECTED 10/05/2017 0819   THCU NONE DETECTED 10/05/2017 0819   LABBARB (A) 10/05/2017 0819    Result not available. Reagent lot number recalled by manufacturer.     Alcohol Level:  Recent Labs  Lab 10/05/17 0903  ETH <10    IMAGING: Dg Chest 1 View  Result Date: 10/05/2017 CLINICAL DATA:  Siezure,  unresponsive, unable to cooperate, held for film EXAM: CHEST  1 VIEW COMPARISON:  none FINDINGS: Lungs are clear. Heart size and mediastinal contours are within normal limits. Aortic Atherosclerosis (ICD10-170.0). No effusion. Visualized bones unremarkable. IMPRESSION: No acute cardiopulmonary disease. Electronically Signed   By: Corlis Leak M.D.   On: 10/05/2017 08:29   Ct Head Wo Contrast  Result Date: 10/05/2017 CLINICAL DATA:  walking and collapsed stiff with possible seizure activity. Pt had seizure activity en route to hospital with ems and was incontinent of urine. Given Versed 2.5mg  for this. Pt is neglecting right side  of body with no verbal response. EXAM: CT HEAD WITHOUT CONTRAST TECHNIQUE: Contiguous axial images were obtained from the base of the skull through the vertex without intravenous contrast. Technologist notes Multiple attempts to scan patient with multiple restraints tried before diagnostic exam achieved./ COMPARISON:  None. FINDINGS: Brain: Mild atrophy. There is no evidence of acute intracranial hemorrhage, brain edema, mass lesion, acute infarction, mass effect, or midline shift. Acute infarct may be inapparent on noncontrast CT. No other intra-axial abnormalities are seen, and the ventricles and sulci are within normal limits in size and symmetry. No abnormal extra-axial fluid collections or masses are identified. Some continued motion degradation. Vascular: No hyperdense vessel or unexpected calcification. Skull: Normal. Negative for fracture or focal lesion. Sinuses/Orbits: Hypoplastic frontal sinuses.  Otherwise negative. Other: None. IMPRESSION: Atrophy.  No bleed or other acute intracranial process. Electronically Signed   By: Corlis Leak  Hassell M.D.   On: 10/05/2017 08:29     Assessment/Plan: This is a [age]yr old with baseline ETOH abuse. Per report, over last few days she hasn't been well and has not been able to eat or drink. She has been confused over last few days also, which is not  normal for her.   # Encephalopathic- toxic/metabolic, CNS infection, Wernickes.  # Agitated DTs- monitor; CIWA plan # Heavy ETOH abuse- counsel when able # Severe protein calorie malnutrition- recommend dobhoff tube for meds/nutrition & replace any deficiencies/electrolytes.   Plan/Recs: Stat EEG to r/o status CTA head/neck CIWA, will likely need scheduled Ativan protocol LP now given confusion and felling ill prodrome and now fever.  Thiamine 500mg  TID for 3 days, then move to PO 100mg  daily   Discussed with Dr Amada JupiterKirkpatrick. Attending neurologist's note to follow  I have seen the patient reviewed the above note.     New onset seizures in the setting of likely alcohol withdrawal.  Given her prodrome, I do have concern that there may be CNS infection and therefore LP is being performed.  If this is negative, then I think it makes Wernicke's encephalopathy/alcohol withdrawal much more likely.  I would do high-dose IV thiamine.  At the time of finalizing this note, the stat EEG has been performed and is negative for ongoing seizure, the CTA shows no LVO, the vert occlusion I think is unlikely to be causing her current symptoms.  She will need an MRI as pres would also be in the differential here.  Ritta SlotMcNeill Abir Eroh, MD [facility] [phone]  If 7pm- 7am, please page neurology on call as listed in AMION.

## 2017-10-05 NOTE — Procedures (Signed)
History: 57 year old female with a history of altered mental status  Sedation: None  Technique: This is a 21 channel routine scalp EEG performed at the bedside with bipolar and monopolar montages arranged in accordance to the international 10/20 system of electrode placement. One channel was dedicated to EKG recording.    Background: There is a posterior dominant rhythm that is well-formed with a posterior dominant rhythm of 8 to 9 Hz.  In addition there is excess anteriorly distributed beta activity.  There is some mild generalized irregular delta intrusion as well.  Photic stimulation: Physiologic driving is not performed  EEG Abnormalities: 1) mild generalized irregular delta activity  Clinical Interpretation: This EEG is consistent with a  mild generalized nonspecific cerebral dysfunction (encephalopathy). There was no seizure or seizure predisposition recorded on this study. Please note that a normal EEG does not preclude the possibility of epilepsy.   Ritta SlotMcNeill Gotti Alwin, MD [facility] [phone]  If 7pm- 7am, please page neurology on call as listed in AMION.

## 2017-10-05 NOTE — ED Triage Notes (Addendum)
Pt brought in by ems after family called stating pt was walking and collapsed stiff with possible seizure activity.  Pt had seizure activity en route to hospital with ems and was incontinent of urine.  Given Versed 2.5mg  for this.  Pt is neglecting right side of body with no verbal response.

## 2017-10-05 NOTE — ED Notes (Signed)
Pt to CT

## 2017-10-05 NOTE — ED Notes (Signed)
Unable to obtain IV access due to combativeness of patient. Will have charge nurse try.

## 2017-10-06 DIAGNOSIS — E512 Wernicke's encephalopathy: Secondary | ICD-10-CM

## 2017-10-06 DIAGNOSIS — F10239 Alcohol dependence with withdrawal, unspecified: Secondary | ICD-10-CM

## 2017-10-06 DIAGNOSIS — E440 Moderate protein-calorie malnutrition: Secondary | ICD-10-CM

## 2017-10-06 LAB — CBC
HEMATOCRIT: 37.2 % (ref 36.0–46.0)
HEMOGLOBIN: 13.3 g/dL (ref 12.0–15.0)
MCH: 32.2 pg (ref 26.0–34.0)
MCHC: 35.8 g/dL (ref 30.0–36.0)
MCV: 90.1 fL (ref 78.0–100.0)
PLATELETS: 177 10*3/uL (ref 150–400)
RBC: 4.13 MIL/uL (ref 3.87–5.11)
RDW: 18.3 % — ABNORMAL HIGH (ref 11.5–15.5)
WBC: 6.2 10*3/uL (ref 4.0–10.5)

## 2017-10-06 LAB — HEPATITIS PANEL, ACUTE
Hep A IgM: NEGATIVE
Hep B C IgM: NEGATIVE
Hepatitis B Surface Ag: NEGATIVE

## 2017-10-06 LAB — HERPES SIMPLEX VIRUS(HSV) DNA BY PCR
HSV 1 DNA: NEGATIVE
HSV 2 DNA: NEGATIVE

## 2017-10-06 LAB — BASIC METABOLIC PANEL
Anion gap: 16 — ABNORMAL HIGH (ref 5–15)
BUN: 5 mg/dL — ABNORMAL LOW (ref 6–20)
CHLORIDE: 99 mmol/L (ref 98–111)
CO2: 19 mmol/L — AB (ref 22–32)
Calcium: 8 mg/dL — ABNORMAL LOW (ref 8.9–10.3)
Creatinine, Ser: 0.74 mg/dL (ref 0.44–1.00)
GFR calc Af Amer: >60 mL/min (ref >60)
GFR calc non Af Amer: >60 mL/min (ref >60)
GLUCOSE: 97 mg/dL (ref 70–99)
POTASSIUM: 3 mmol/L — AB (ref 3.5–5.1)
Sodium: 134 mmol/L — ABNORMAL LOW (ref 135–145)

## 2017-10-06 LAB — HIV ANTIBODY (ROUTINE TESTING W REFLEX): HIV Screen 4th Generation wRfx: NONREACTIVE

## 2017-10-06 MED ORDER — POTASSIUM CHLORIDE 10 MEQ/100ML IV SOLN
10.0000 meq | INTRAVENOUS | Status: AC
  Administered 2017-10-06 (×4): 10 meq via INTRAVENOUS
  Filled 2017-10-06 (×9): qty 100

## 2017-10-06 MED ORDER — MAGNESIUM SULFATE 2 GM/50ML IV SOLN
2.0000 g | Freq: Once | INTRAVENOUS | Status: AC
  Administered 2017-10-06: 2 g via INTRAVENOUS
  Filled 2017-10-06: qty 50

## 2017-10-06 MED ORDER — ORAL CARE MOUTH RINSE
15.0000 mL | Freq: Two times a day (BID) | OROMUCOSAL | Status: DC
  Administered 2017-10-06 – 2017-10-10 (×7): 15 mL via OROMUCOSAL

## 2017-10-06 NOTE — Evaluation (Signed)
Clinical/Bedside Swallow Evaluation Patient Details  Name: Smith Minceamela G Ruggles MRN: [id number] Date of Birth: 23-Jan-1961  Today's Date: 10/06/2017 Time: SLP Start Time (ACUTE ONLY): 1621 SLP Stop Time (ACUTE ONLY): 1632 SLP Time Calculation (min) (ACUTE ONLY): 11 min  Past Medical History:  Past Medical History:  Diagnosis Date  . Anxiety   . DDD (degenerative disc disease), lumbar   . DDD (degenerative disc disease), lumbar   . Depression   . Disability of walking   . Hypertension    Past Surgical History: History reviewed. No pertinent surgical history. HPI:  Pt with PMH of ETOH abuse presents with hyponatremia, seizures, acute encephalopathy, and possible Todd's paralysis. CXR clear, CT Head without acute findings, MRI pending.   Assessment / Plan / Recommendation Clinical Impression  Pt's mentation is a barrier to safe PO intake. She is drowsy, confused, and not following commands consistently. Reduced bolus awareness and moderately prolonged oral manipulation is followed by multiple subswallows and immediate coughing intermittently with thin liquids. Could allow meds crushed in puree for today if pt is fully alert to try, but would otherwise maintain NPO status pending improved cognitive status. Will f/u for PO readiness. SLP Visit Diagnosis: Dysphagia, unspecified (R13.10)    Aspiration Risk  Moderate aspiration risk    Diet Recommendation NPO except meds   Medication Administration: Crushed with puree    Other  Recommendations Oral Care Recommendations: Oral care QID   Follow up Recommendations (tba)      Frequency and Duration min 2x/week  2 weeks       Prognosis Prognosis for Safe Diet Advancement: Good Barriers to Reach Goals: Cognitive deficits      Swallow Study   General HPI: Pt with PMH of ETOH abuse presents with hyponatremia, seizures, acute encephalopathy, and possible Todd's paralysis. CXR clear, CT Head without acute findings, MRI pending. Type of  Study: Bedside Swallow Evaluation Previous Swallow Assessment: none in chart Diet Prior to this Study: NPO Temperature Spikes Noted: Yes(100.3) Respiratory Status: Room air History of Recent Intubation: No Behavior/Cognition: Lethargic/Drowsy;Confused;Requires cueing Oral Cavity Assessment: Within Functional Limits Oral Care Completed by SLP: No Oral Cavity - Dentition: Edentulous Self-Feeding Abilities: Total assist Patient Positioning: Upright in bed Baseline Vocal Quality: Normal    Oral/Motor/Sensory Function Overall Oral Motor/Sensory Function: Other (comment)(doesn't follow commands to assess)   Ice Chips Ice chips: Not tested   Thin Liquid Thin Liquid: Impaired Presentation: Straw Oral Phase Impairments: Poor awareness of bolus Pharyngeal  Phase Impairments: Suspected delayed Swallow;Cough - Immediate;Multiple swallows    Nectar Thick Nectar Thick Liquid: Not tested   Honey Thick Honey Thick Liquid: Not tested   Puree Puree: Impaired Presentation: Spoon Oral Phase Impairments: Poor awareness of bolus Oral Phase Functional Implications: Prolonged oral transit Pharyngeal Phase Impairments: Multiple swallows   Solid   GO   Solid: Not tested        Maxcine Hamaiewonsky, Sanyia Dini 10/06/2017,4:53 PM  Maxcine HamLaura Paiewonsky, M.A. CCC-SLP [phone]

## 2017-10-06 NOTE — Progress Notes (Addendum)
Subjective:  Currently patient is in bed with two-point restraints.  She is comfortable and not writhing.  When asked questions patient talks nonsensically.  She is not aware where she is.  She does not follow commands.  She is very encephalopathic.   Noted on initial consultation, the patient is a heavy drinker and 2 days prior to being admitted she had not been feeling too well and was not drinking or eating.  There is also question if she was taking her medications which does include Xanax-unclear how many pills she was taking a day.  She does have a high blood pressure at this point in time 170s/109 along with tachycardia  I did get a hold of the husband.  He states that she drinks approximately 4-5 16 ounce beers a day and she is been doing this for greater than 20 years.  She may be drinking more than this on a daily basis.  In addition she was taking her Xanax at least twice a day and also stopped taking her Xanax during those 2 days.  Exam: Vitals:   10/06/17 0500 10/06/17 0724  BP:    Pulse:    Resp:    Temp: 99.2 F (37.3 C) 99.8 F (37.7 C)  SpO2:      Physical Exam   HEENT-  Normocephalic, no lesions, without obvious abnormality.  Normal external eye and conjunctiva.   Extremities- Warm, dry and intact Musculoskeletal-no joint tenderness, deformity or swelling Skin-warm and dry, no hyperpigmentation, vitiligo, or suspicious lesions  Neuro:  Mental Status: As stated above patient is comfortable in two-point restraint.  She does not follow commands.  Speech is dysarthric and nonsensical.  She winces to pain but does not cross midline or even attempt to localize Cranial Nerves: II: No blink to threat III,IV, VI: Holding eyes closed, doll's are intact. Pupillary light reflexes are intact but she does have disconjugate gaze  VII: Face appears symmetric VIII: Grossly intact: attempts to answer verbal questions  IX,X: Palate rises symmetrically Motor: Moves all extremities  antigravity. Lower extremities stronger than upper extremities upon withdrawal to noxious Sensory: Withdraws from noxious stimuli all 4 extremities Deep Tendon Reflexes: 2+ and symmetric throughout Plantars: Mute bilaterally   Medications:  Prior to Admission:  Medications Prior to Admission  Medication Sig Dispense Refill Last Dose  . ALPRAZolam (XANAX) 0.5 MG tablet Take 1 Tablet by mouth 2 times a day as needed (Patient taking differently: Take 0.5 mg by mouth 2 (two) times daily as needed for anxiety. ) 60 tablet 5 unknown  . cyclobenzaprine (FLEXERIL) 10 MG tablet Take 1 tablet (10 mg total) by mouth 2 (two) times daily. 60 tablet 5 unknown  . HYDROcodone-acetaminophen (NORCO) 10-325 MG tablet Take 1-2 tablets by mouth every 6 (six) hours as needed. (Patient taking differently: Take 1-2 tablets by mouth every 6 (six) hours as needed (pain). ) 240 tablet 0 unknown  . megestrol (MEGACE) 40 MG tablet TAKE 1 TABLET BY MOUTH 2 TIMES A DAY 180 tablet 1 unknown  . traZODone (DESYREL) 100 MG tablet TAKE 1 TABLET BY MOUTH EVERY DAY AT BEDTIME AS NEEDED FOR SLEEP 30 tablet 11 unknown  . citalopram (CELEXA) 20 MG tablet TAKE 1 TABLET BY MOUTH EVERY DAY 90 tablet 0   . citalopram (CELEXA) 40 MG tablet Take 1 tablet (40 mg total) by mouth daily. 30 tablet 5 unknown  . loratadine (CLARITIN) 10 MG tablet Take 1 tablet (10 mg total) by mouth daily. 30 tablet 11  unknown   Scheduled: . folic acid  1 mg Intravenous Daily  . heparin  5,000 Units Subcutaneous Q8H  . loratadine  10 mg Oral Daily  . metoprolol tartrate  5 mg Intravenous Q8H  . sodium chloride flush  3 mL Intravenous Q12H    Pertinent Labs/Diagnostics: EEG showed no epileptiform activity.   Awaiting MRI of brain  Results for Smith MinceSCALES, Sherleen G (MRN [id number]) as of 10/06/2017 10:20  Ref. Range 10/05/2017 17:09 10/05/2017 17:15  Appearance, CSF Latest Ref Range: CLEAR  CLEAR   Glucose, CSF Latest Ref Range: 40 - 70 mg/dL  78 (H)  RBC Count,  CSF Latest Ref Range: 0 /cu mm 1 (H)   Other Cells, CSF Unknown TOO FEW TO COUNT,...   Color, CSF Latest Ref Range: COLORLESS  COLORLESS   Supernatant Unknown NOT INDICATED   Total  Protein, CSF Latest Ref Range: 15 - 45 mg/dL  36  Tube # Unknown 1   WBC, CSF Latest Ref Range: 0 - 5 /cu mm 2        Ct Head Wo Contrast IMPRESSION: CT head: 1. 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 2. No new stroke, hemorrhage, or mass effect identified. CTA neck: 1. 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 Patent vessel downstream to the C4 level. 2. Thread-like severe stenosis of left vertebral artery at the C2 level with mild widening of the vessel may represent dissection. 3. Patent bilateral carotid systems and right vertebral artery. No dissection, aneurysm, or significant stenosis by NASCET criteria. CTA head: 1. No large vessel occlusion, aneurysm, or significant stenosis is identified. 2. 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 These results were called by telephone at the time of interpretation on 10/05/2017 at 4:23 pm to Dr. Ritta SlotMCNEILL KIRKPATRICK, who verbally acknowledged these results. Electronically Signed   By: Mitzi HansenLance  Furusawa-Stratton M.D.   On: 10/05/2017 16:24     Felicie MornDavid Smith PA-C [facility] [phone]   Assessment: 57 year old female with baseline alcohol abuse, presenting with confusion after stopping alcohol due to decreased po intake.  She also had been on Xanax but it is unclear of dosing at home and how often she was taking it.  -Encephalopathy. DDx primarily toxic/metabolic/EtOH withdrawal. CNS infection has been ruled out. Warnicke's encephalopathy also on DDx.  - Heavy alcohol abuse - possible withdrawal syndrome - EEG showed no epileptiform activity - CTA head and neck were normal -  On CIWA protocol at this time - Currently on 500 mg 3 times daily of thiamine which will be changed to 100 mg a day after the 3 days.  I believe at this time with the LP being within normal limits, CTA within normal limits, cessation of alcohol and possibly Xanax, this most likely is encephalopathy/alcohol withdrawal/benzo withdrawal.  Still awaiting thiamine level and MRI.   Recommendations: -MRI brain -Follow-up on labs -Continue CIWA protocol   Electronically signed: Dr. Caryl PinaEric Marianela Mandrell 10/06/2017, 10:13 AM

## 2017-10-06 NOTE — Progress Notes (Signed)
Initial Nutrition Assessment  DOCUMENTATION CODES:   Non-severe (moderate) malnutrition in context of chronic illness  INTERVENTION:  Monitor for diet advancement, provide supplemental nutrition as needed  NUTRITION DIAGNOSIS:   Moderate Malnutrition related to chronic illness(ETOH abuse) as evidenced by moderate muscle depletion, moderate fat depletion.  GOAL:   Patient will meet greater than or equal to 90% of their needs  MONITOR:   Diet advancement, I & O's, Labs  REASON FOR ASSESSMENT:   Low Braden    ASSESSMENT:   Patient with PMH ETOH abuse presents with hyponatremia, seizures, acute encephalopathy, and possible Todd's paralysis  Spoke with patient's family at bedside, patient was unable to provide any history. They are unsure of her UBW but state patient has been losing weight lately. Per chart, she exhibits an 8 pound/7.4% insignificant weight loss in 4 months. Usual PO intake consists of biscuits and gravy, and dinner out to eat. It is also reported she has drank 4-5 16 ounce beers daily for the past 20 years and takes Xanax at least twice a day. Will monitor diet advancement and PO intake.  Labs reviewed:  Na 134, K+ 3.0  Medications reviewed and include:  Folic acid NS at 11825mL/hr 500mg  thiamine IV TID   NUTRITION - FOCUSED PHYSICAL EXAM:    Most Recent Value  Orbital Region  Mild depletion  Upper Arm Region  No depletion  Thoracic and Lumbar Region  Unable to assess  Buccal Region  Mild depletion  Temple Region  Mild depletion  Clavicle Bone Region  No depletion  Clavicle and Acromion Bone Region  No depletion  Scapular Bone Region  Unable to assess  Dorsal Hand  Unable to assess  Patellar Region  Mild depletion  Anterior Thigh Region  Mild depletion  Posterior Calf Region  Mild depletion  Edema (RD Assessment)  None       Diet Order:   Diet Order           Diet NPO time specified Except for: Sips with Meds  Diet effective now           EDUCATION NEEDS:   Not appropriate for education at this time  Skin:  Skin Assessment: Reviewed RN Assessment  Last BM:  PTA  Height:   Ht Readings from Last 1 Encounters:  10/05/17 5\' 3"  (1.6 m)    Weight:   Wt Readings from Last 1 Encounters:  10/05/17 100 lb (45.4 kg)    Ideal Body Weight:  52.27 kg  BMI:  Body mass index is 17.71 kg/m.  Estimated Nutritional Needs:   Kcal:  1500-1700 calories  Protein:  64-72 grams (1.4-1.6g/kg)  Fluid:  >1.5L    Dionne AnoWilliam M. Ormond Lazo, MS, RD LDN Inpatient Clinical Dietitian Pager [phone]\

## 2017-10-06 NOTE — Plan of Care (Signed)
  Problem: Education: Goal: Knowledge of General Education information will improve Outcome: Progressing   Problem: Health Behavior/Discharge Planning: Goal: Ability to manage health-related needs will improve Outcome: Progressing   Problem: Clinical Measurements: Goal: Ability to maintain clinical measurements within normal limits will improve Outcome: Progressing Goal: Will remain free from infection Outcome: Progressing Goal: Diagnostic test results will improve Outcome: Progressing Goal: Respiratory complications will improve Outcome: Progressing Goal: Cardiovascular complication will be avoided Outcome: Progressing   Problem: Activity: Goal: Risk for activity intolerance will decrease Outcome: Progressing   Problem: Nutrition: Goal: Adequate nutrition will be maintained Outcome: Progressing   Problem: Coping: Goal: Level of anxiety will decrease Outcome: Progressing   Problem: Elimination: Goal: Will not experience complications related to bowel motility Outcome: Progressing Goal: Will not experience complications related to urinary retention Outcome: Progressing   Problem: Pain Managment: Goal: General experience of comfort will improve Outcome: Progressing   Problem: Safety: Goal: Ability to remain free from injury will improve Outcome: Progressing   Problem: Skin Integrity: Goal: Risk for impaired skin integrity will decrease Outcome: Progressing   Problem: Coping: Goal: Ability to adjust to condition or change in health will improve Outcome: Progressing   Problem: Health Behavior/Discharge Planning: Goal: Compliance with prescribed medication regimen will improve Outcome: Progressing   Problem: Medication: Goal: Risk for medication side effects will decrease Outcome: Progressing   Problem: Clinical Measurements: Goal: Complications related to the disease process, condition or treatment will be avoided or minimized Outcome: Progressing Goal:  Diagnostic test results will improve Outcome: Progressing   Problem: Safety: Goal: Verbalization of understanding the information provided will improve Outcome: Progressing   Problem: Self-Concept: Goal: Level of anxiety will decrease Outcome: Progressing

## 2017-10-06 NOTE — Progress Notes (Signed)
PROGRESS NOTE  Betty Nelson ONG:[id number] DOB: April 25, 1960 DOA: 10/05/2017 PCP: Remus Loffler, PA-C   LOS: 1 day   Brief Narrative / Interim history: 57 year old female with history of alcohol abuse, depression, hypertension who was brought to the hospital on 6/30 because he was found down in her home with apparent seizures. Patient's husband mentioned that she has been having slight memory problems over the last 4 to 5 weeks. In the past 2-3 days prior to hospitalization, he has noticed her to be a little bit less interactive, decreased appetite, and has not had any alcohol since that day. She is also taking Xanax and hydrocodone, husband is not sure how much or whether she ran out.   Assessment & Plan: Principal Problem:   Todd's paralysis (HCC) Active Problems:   Essential hypertension   Alcohol abuse   Protein calorie malnutrition (HCC)   Depression   Seizures -Possibly in the setting of alcohol withdrawal with Todd's paralysis given right-sided weakness -Neurology consulted, following, appreciate input -EEG without active seizures, patient is on Keppra and will continue -CT angiogram without significant blockages -MRI of the brain pending  Alcohol abuse -Placed on CIWA, continue, slight elevation of the LFTs are in a pattern consistent with ongoing alcohol use  Acute metabolic encephalopathy -Primarily metabolic in the setting of alcohol withdrawal.  Check vitamin B1.  LP was unremarkable for any infection, EEG without any epileptiform activity, CTA of the head and neck were normal -Continue high-dose IV thiamine -Remains quite encephalopathic this morning however appears a little bit calmer than yesterday  Increased anion gap metabolic acidosis -Likely due to seizures, lactic acid slightly elevated, improving today  Hypokalemia -In the setting of alcohol abuse, check magnesium, replete potassium continue to monitor  Memory problems /increased weakness /poor  appetite over the last few days -Unclear cause,?  Related to home medications, does not appear to have any ongoing infections.  TSH normal. Check B1 / B12 given alcohol abuse  Hypochloremic hyponatremia -Appears dehydrated, patient with poor p.o. intake over the last 3 to 4 days, continue IV fluids -Improving today     DVT prophylaxis: heparin Code Status: Full code Family Communication: no family at bedside, d/w husband over the phone last night Disposition Plan: TBD  Consultants:   Neurology   Procedures:   LP 6/30  Antimicrobials:  None    Subjective: -Remains confused, intermittently follows commands, she does not answer most of my questions.  Objective: Vitals:   10/06/17 0109 10/06/17 0439 10/06/17 0500 10/06/17 0724  BP: (!) 161/82 90/73    Pulse: (!) 108 (!) 106    Resp: (!) 31 (!) 33    Temp: 98 F (36.7 C)  99.2 F (37.3 C) 99.8 F (37.7 C)  TempSrc: Axillary  Axillary Oral  SpO2: 100% 100%    Weight:      Height:        Intake/Output Summary (Last 24 hours) at 10/06/2017 1121 Last data filed at 10/06/2017 1010 Gross per 24 hour  Intake 1204.6 ml  Output -  Net 1204.6 ml   Filed Weights   10/05/17 0739  Weight: 45.4 kg (100 lb)    Examination:  Constitutional: Very confused Eyes: lids and conjunctivae normal, PERRL Respiratory: clear to auscultation bilaterally, no wheezing, no crackles. Normal respiratory effort. No accessory muscle use.  Cardiovascular: Regular rate and rhythm, no murmurs / rubs / gallops. No LE edema. 2+ pedal pulses. Abdomen: no tenderness. Bowel sounds positive.  Skin: no rashes Neurologic:  Spontaneously moves all 4 extremities  Data Reviewed: I have independently reviewed following labs and imaging studies   CBC: Recent Labs  Lab 10/05/17 0903 10/05/17 1350 10/06/17 0655  WBC 9.0 7.7 6.2  NEUTROABS 7.7  --   --   HGB 15.1* 12.3 13.3  HCT 43.4 34.2* 37.2  MCV 94.8 91.2 90.1  PLT 172 162 177   Basic  Metabolic Panel: Recent Labs  Lab 10/05/17 0903 10/05/17 1350 10/06/17 0655  NA 131*  --  134*  K 4.0  --  3.0*  CL 95*  --  99  CO2 19*  --  19*  GLUCOSE 162*  --  97  BUN 7  --  5*  CREATININE 0.74 0.72 0.74  CALCIUM 9.1  --  8.0*  MG  --  1.3*  --   PHOS  --  3.4  --    GFR: Estimated Creatinine Clearance: 54.3 mL/min (by C-G formula based on SCr of 0.74 mg/dL). Liver Function Tests: Recent Labs  Lab 10/05/17 0903  AST 58*  ALT 28  ALKPHOS 63  BILITOT 1.2  PROT 8.8*  ALBUMIN 4.6   No results for input(s): LIPASE, AMYLASE in the last 168 hours. Recent Labs  Lab 10/05/17 1739  AMMONIA 52*   Coagulation Profile: No results for input(s): INR, PROTIME in the last 168 hours. Cardiac Enzymes: No results for input(s): CKTOTAL, CKMB, CKMBINDEX, TROPONINI in the last 168 hours. BNP (last 3 results) No results for input(s): PROBNP in the last 8760 hours. HbA1C: No results for input(s): HGBA1C in the last 72 hours. CBG: Recent Labs  Lab 10/05/17 0743  GLUCAP 175*   Lipid Profile: No results for input(s): CHOL, HDL, LDLCALC, TRIG, CHOLHDL, LDLDIRECT in the last 72 hours. Thyroid Function Tests: Recent Labs    10/05/17 0903  TSH 1.691   Anemia Panel: Recent Labs    10/05/17 1429  VITAMINB12 301   Urine analysis:    Component Value Date/Time   COLORURINE YELLOW 10/05/2017 0819   APPEARANCEUR CLEAR 10/05/2017 0819   LABSPEC 1.012 10/05/2017 0819   PHURINE 6.0 10/05/2017 0819   GLUCOSEU 50 (A) 10/05/2017 0819   HGBUR SMALL (A) 10/05/2017 0819   BILIRUBINUR NEGATIVE 10/05/2017 0819   KETONESUR NEGATIVE 10/05/2017 0819   PROTEINUR >=300 (A) 10/05/2017 0819   NITRITE NEGATIVE 10/05/2017 0819   LEUKOCYTESUR NEGATIVE 10/05/2017 0819   Sepsis Labs: Invalid input(s): PROCALCITONIN, LACTICIDVEN  Recent Results (from the past 240 hour(s))  CSF culture     Status: None (Preliminary result)   Collection Time: 10/05/17  5:15 PM  Result Value Ref Range Status    Specimen Description CSF TUBE 2  Final   Special Requests NONE  Final   Gram Stain   Final    WBC PRESENT,BOTH PMN AND MONONUCLEAR NO ORGANISMS SEEN CYTOSPIN SMEAR Performed at [facility], [address], [place], [place] [address]    Culture PENDING  Incomplete   Report Status PENDING  Incomplete      Radiology Studies: Ct Angio Head W Or Wo Contrast  Result Date: 10/05/2017 CLINICAL DATA:  Code stroke. Evaluate posterior circulation, speech difficulty. EXAM: CT HEAD WITHOUT CONTRAST CT ANGIOGRAPHY HEAD AND NECK TECHNIQUE: Multidetector CT imaging of the head and neck was performed using the standard protocol during bolus administration of intravenous contrast. Multiplanar CT image reconstructions and MIPs were obtained to evaluate the vascular anatomy. Carotid stenosis measurements (when applicable) are obtained utilizing NASCET criteria, using the distal internal carotid  diameter as the denominator. CONTRAST:  50mL ISOVUE-370 IOPAMIDOL (ISOVUE-370) INJECTION 76% COMPARISON:  10/05/2017 CT head. 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 Right occipital lobe small cortical lucency without volume loss, possibly acute or early subacute infarction. Stable age indeterminate lacunar infarction within the right lentiform nucleus. No hemorrhage, mass effect, extra-axial collection, hydrocephalus, or herniation. Vascular: As below. Skull: Normal. Negative for fracture or focal lesion. Sinuses: Imaged portions are clear. Orbits: No acute finding. Review of the MIP images confirms the above findings CTA NECK FINDINGS Aortic arch: Standard branching. Imaged portion shows no evidence of aneurysm or dissection. 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 Right carotid system: No evidence of dissection, stenosis (50% or greater) or occlusion. Moderate calcified plaque of the carotid  bifurcation with mild less 50% proximal ICA stenosis. Left carotid system: No evidence of dissection, stenosis (50% or greater) or occlusion. Moderate calcified plaque of the left common carotid artery and the carotid bifurcation with minimal less than 30% stenosis. Vertebral arteries: Right dominant vertebral artery system. Widely patent right vertebral artery. 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 The vertebral artery downstream to the C4 level is diminutive, but patent. At the C2 level the patent lumen is thread-like (series 8, image 110) and the vessel itself is mildly widened suggesting dissection. Skeleton: Moderate cervical spondylosis greatest at the C4-C6 levels. No high-grade bony canal stenosis. Other neck: 11 mm calcified nodule within the left thyroid gland. Upper chest: Negative. Review of the MIP images confirms the above findings CTA HEAD FINDINGS Anterior circulation: No significant stenosis, proximal occlusion, aneurysm, or vascular malformation. Calcified plaque of the carotid siphons with mild left distal cavernous stenosis. Posterior circulation: No significant stenosis, proximal occlusion, aneurysm, or vascular malformation. Venous sinuses: As permitted by contrast timing, patent. Anatomic variants: Complete circle-of-Willis. Review of the MIP images confirms the above findings IMPRESSION: CT head: 1. 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 2. No new stroke, hemorrhage, or mass effect identified. CTA neck: 1. 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 Patent vessel downstream to the C4 level. 2. Thread-like severe stenosis of left vertebral artery at the C2 level with mild widening of the vessel may represent dissection. 3. Patent bilateral carotid systems and right vertebral artery. No dissection, aneurysm, or significant  stenosis by NASCET criteria. CTA head: 1. No large vessel occlusion, aneurysm, or significant stenosis is identified. 2. 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 These results were called by telephone at the time of interpretation on 10/05/2017 at 4:23 pm to Dr. Ritta Slot, who verbally acknowledged these results. Electronically Signed   By: Mitzi Hansen M.D.   On: 10/05/2017 16:24   Dg Chest 1 View  Result Date: 10/05/2017 CLINICAL DATA:  [place], unresponsive, unable to cooperate, held for film EXAM: CHEST  1 VIEW COMPARISON:  none FINDINGS: Lungs are clear. Heart size and mediastinal contours are within normal limits. Aortic Atherosclerosis (ICD10-170.0). No effusion. Visualized bones unremarkable. IMPRESSION: No acute cardiopulmonary disease. Electronically Signed   By: Corlis Leak M.D.   On: 10/05/2017 08:29   Ct Head Wo Contrast  Result Date: 10/05/2017 CLINICAL DATA:  walking and collapsed stiff with possible seizure activity. Pt had seizure activity en route to hospital with ems and was incontinent of urine. Given Versed  2.5mg  for this. Pt is neglecting right side of body with no verbal response. EXAM: CT HEAD WITHOUT CONTRAST TECHNIQUE: Contiguous axial images were obtained from the base of the skull through the vertex without intravenous contrast. Technologist notes Multiple attempts to scan patient with multiple restraints tried before diagnostic exam achieved./ COMPARISON:  None. FINDINGS: Brain: Mild atrophy. There is no evidence of acute intracranial hemorrhage, brain edema, mass lesion, acute infarction, mass effect, or midline shift. Acute infarct may be inapparent on noncontrast CT. No other intra-axial abnormalities are seen, and the ventricles and sulci are within normal limits in size and symmetry. No abnormal extra-axial fluid collections or masses are identified. Some continued motion degradation. Vascular: No hyperdense  vessel or unexpected calcification. Skull: Normal. Negative for fracture or focal lesion. Sinuses/Orbits: Hypoplastic frontal sinuses.  Otherwise negative. Other: None. IMPRESSION: Atrophy.  No bleed or other acute intracranial process. Electronically Signed   By: Corlis Leak  Hassell M.D.   On: 10/05/2017 08:29   Ct Angio Neck W Or Wo Contrast  Result Date: 10/05/2017 CLINICAL DATA:  Code stroke. Evaluate posterior circulation, speech difficulty. EXAM: CT HEAD WITHOUT CONTRAST CT ANGIOGRAPHY HEAD AND NECK TECHNIQUE: Multidetector CT imaging of the head and neck was performed using the standard protocol during bolus administration of intravenous contrast. Multiplanar CT image reconstructions and MIPs were obtained to evaluate the vascular anatomy. Carotid stenosis measurements (when applicable) are obtained utilizing NASCET criteria, using the distal internal carotid diameter as the denominator. CONTRAST:  50mL ISOVUE-370 IOPAMIDOL (ISOVUE-370) INJECTION 76% COMPARISON:  10/05/2017 CT head. 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 Right occipital lobe small cortical lucency without volume loss, possibly acute or early subacute infarction. Stable age indeterminate lacunar infarction within the right lentiform nucleus. No hemorrhage, mass effect, extra-axial collection, hydrocephalus, or herniation. Vascular: As below. Skull: Normal. Negative for fracture or focal lesion. Sinuses: Imaged portions are clear. Orbits: No acute finding. Review of the MIP images confirms the above findings CTA NECK FINDINGS Aortic arch: Standard branching. Imaged portion shows no evidence of aneurysm or dissection. 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 Right carotid system: No evidence of dissection, stenosis (50% or greater) or occlusion. Moderate calcified plaque of the carotid bifurcation with mild less 50% proximal ICA  stenosis. Left carotid system: No evidence of dissection, stenosis (50% or greater) or occlusion. Moderate calcified plaque of the left common carotid artery and the carotid bifurcation with minimal less than 30% stenosis. Vertebral arteries: Right dominant vertebral artery system. Widely patent right vertebral artery. 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 The vertebral artery downstream to the C4 level is diminutive, but patent. At the C2 level the patent lumen is thread-like (series 8, image 110) and the vessel itself is mildly widened suggesting dissection. Skeleton: Moderate cervical spondylosis greatest at the C4-C6 levels. No high-grade bony canal stenosis. Other neck: 11 mm calcified nodule within the left thyroid gland. Upper chest: Negative. Review of the MIP images confirms the above findings CTA HEAD FINDINGS Anterior circulation: No significant stenosis, proximal occlusion, aneurysm, or vascular malformation. Calcified plaque of the carotid siphons with mild left distal cavernous stenosis. Posterior circulation: No significant stenosis, proximal occlusion, aneurysm, or vascular malformation. Venous sinuses: As permitted by contrast timing, patent. Anatomic variants: Complete circle-of-Willis. Review of the MIP images confirms the above findings IMPRESSION: CT head: 1. 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 2. No new stroke, hemorrhage, or mass effect identified. CTA neck: 1. 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 Patent vessel downstream to the C4 level. 2. Thread-like severe stenosis of left vertebral artery at the C2 level with mild widening of the vessel may represent dissection. 3. Patent bilateral carotid systems and right vertebral artery. No dissection, aneurysm, or significant stenosis by NASCET criteria. CTA head: 1. No large  vessel occlusion, aneurysm, or significant stenosis is identified. 2. 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 These results were called by telephone at the time of interpretation on 10/05/2017 at 4:23 pm to Dr. Ritta Slot, who verbally acknowledged these results. Electronically Signed   By: Mitzi Hansen M.D.   On: 10/05/2017 16:24   Ct Head Code Stroke Wo Contrast  Result Date: 10/05/2017 CLINICAL DATA:  Code stroke. Evaluate posterior circulation, speech difficulty. EXAM: CT HEAD WITHOUT CONTRAST CT ANGIOGRAPHY HEAD AND NECK TECHNIQUE: Multidetector CT imaging of the head and neck was performed using the standard protocol during bolus administration of intravenous contrast. Multiplanar CT image reconstructions and MIPs were obtained to evaluate the vascular anatomy. Carotid stenosis measurements (when applicable) are obtained utilizing NASCET criteria, using the distal internal carotid diameter as the denominator. CONTRAST:  50mL ISOVUE-370 IOPAMIDOL (ISOVUE-370) INJECTION 76% COMPARISON:  10/05/2017 CT head. 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 Right occipital lobe small cortical lucency without volume loss, possibly acute or early subacute infarction. Stable age indeterminate lacunar infarction within the right lentiform nucleus. No hemorrhage, mass effect, extra-axial collection, hydrocephalus, or herniation. Vascular: As below. Skull: Normal. Negative for fracture or focal lesion. Sinuses: Imaged portions are clear. Orbits: No acute finding. Review of the MIP images confirms the above findings CTA NECK FINDINGS Aortic arch: Standard branching. Imaged portion shows no evidence of aneurysm or dissection. 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 Right carotid system: No evidence of dissection, stenosis (50% or greater) or  occlusion. Moderate calcified plaque of the carotid bifurcation with mild less 50% proximal ICA stenosis. Left carotid system: No evidence of dissection, stenosis (50% or greater) or occlusion. Moderate calcified plaque of the left common carotid artery and the carotid bifurcation with minimal less than 30% stenosis. Vertebral arteries: Right dominant vertebral artery system. Widely patent right vertebral artery. 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 The vertebral artery downstream to the C4 level is diminutive, but patent. At the C2 level the patent lumen is thread-like (series 8, image 110) and the vessel itself is mildly widened suggesting dissection. Skeleton: Moderate cervical spondylosis greatest at the C4-C6 levels. No high-grade bony canal stenosis. Other neck: 11 mm calcified nodule within the left thyroid gland. Upper chest: Negative. Review of the MIP images confirms the above findings CTA HEAD FINDINGS Anterior circulation: No significant stenosis, proximal occlusion, aneurysm, or vascular malformation. Calcified plaque of the carotid siphons with mild left distal cavernous stenosis. Posterior circulation: No significant stenosis, proximal occlusion, aneurysm, or vascular malformation. Venous sinuses: As permitted by contrast timing, patent. Anatomic variants: Complete circle-of-Willis. Review of the MIP images confirms the above findings IMPRESSION: CT head: 1. 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 2. No new stroke, hemorrhage, or mass effect identified. CTA neck: 1. Left vertebral artery occlusion  from origin to C4 level. Patent vessel downstream to the C4 level. 2. Thread-like severe stenosis of left vertebral artery at the C2 level with mild widening of the vessel may represent dissection. 3. Patent bilateral carotid systems and right vertebral  artery. No dissection, aneurysm, or significant stenosis by NASCET criteria. CTA head: 1. No large vessel occlusion, aneurysm, or significant stenosis is identified. 2. 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 These results were called by telephone at the time of interpretation on 10/05/2017 at 4:23 pm to Dr. Ritta Slot, who verbally acknowledged these results. Electronically Signed   By: Mitzi Hansen M.D.   On: 10/05/2017 16:24     Scheduled Meds: . folic acid  1 mg Intravenous Daily  . heparin  5,000 Units Subcutaneous Q8H  . loratadine  10 mg Oral Daily  . metoprolol tartrate  5 mg Intravenous Q8H  . sodium chloride flush  3 mL Intravenous Q12H   Continuous Infusions: . sodium chloride 125 mL/hr at 10/06/17 0154  . levETIRAcetam 500 mg (10/06/17 0547)  . thiamine injection 500 mg (10/06/17 1010)    Pamella Pert, MD, PhD [facility] Pager [phone]  If 7PM-7AM, please contact night-coverage [email or web address] Password [email or web address] 10/06/2017, 11:21 AM

## 2017-10-07 ENCOUNTER — Inpatient Hospital Stay (HOSPITAL_COMMUNITY): Payer: Medicare Other

## 2017-10-07 LAB — COMPREHENSIVE METABOLIC PANEL
ALBUMIN: 3.6 g/dL (ref 3.5–5.0)
ALT: 31 U/L (ref 0–44)
AST: 64 U/L — AB (ref 15–41)
Alkaline Phosphatase: 40 U/L (ref 38–126)
Anion gap: 10 (ref 5–15)
CHLORIDE: 105 mmol/L (ref 98–111)
CO2: 20 mmol/L — AB (ref 22–32)
CREATININE: 0.76 mg/dL (ref 0.44–1.00)
Calcium: 7.9 mg/dL — ABNORMAL LOW (ref 8.9–10.3)
GFR calc Af Amer: >60 mL/min (ref >60)
GFR calc non Af Amer: >60 mL/min (ref >60)
GLUCOSE: 79 mg/dL (ref 70–99)
POTASSIUM: 2.9 mmol/L — AB (ref 3.5–5.1)
Sodium: 135 mmol/L (ref 135–145)
Total Bilirubin: 1.3 mg/dL — ABNORMAL HIGH (ref 0.3–1.2)
Total Protein: 6.7 g/dL (ref 6.5–8.1)

## 2017-10-07 LAB — CBC
HEMATOCRIT: 33.9 % — AB (ref 36.0–46.0)
Hemoglobin: 11.6 g/dL — ABNORMAL LOW (ref 12.0–15.0)
MCH: 32 pg (ref 26.0–34.0)
MCHC: 34.2 g/dL (ref 30.0–36.0)
MCV: 93.6 fL (ref 78.0–100.0)
PLATELETS: 177 10*3/uL (ref 150–400)
RBC: 3.62 MIL/uL — ABNORMAL LOW (ref 3.87–5.11)
RDW: 18.6 % — AB (ref 11.5–15.5)
WBC: 5.4 10*3/uL (ref 4.0–10.5)

## 2017-10-07 LAB — PHOSPHORUS: Phosphorus: 2.8 mg/dL (ref 2.5–4.6)

## 2017-10-07 LAB — MAGNESIUM: Magnesium: 1.9 mg/dL (ref 1.7–2.4)

## 2017-10-07 MED ORDER — MAGNESIUM SULFATE 2 GM/50ML IV SOLN
2.0000 g | Freq: Once | INTRAVENOUS | Status: AC
  Administered 2017-10-07: 2 g via INTRAVENOUS
  Filled 2017-10-07: qty 50

## 2017-10-07 MED ORDER — KCL IN DEXTROSE-NACL 20-5-0.45 MEQ/L-%-% IV SOLN
INTRAVENOUS | Status: DC
  Administered 2017-10-07 – 2017-10-08 (×3): via INTRAVENOUS
  Filled 2017-10-07 (×4): qty 1000

## 2017-10-07 MED ORDER — POTASSIUM CHLORIDE 2 MEQ/ML IV SOLN
INTRAVENOUS | Status: DC
  Filled 2017-10-07: qty 1000

## 2017-10-07 MED ORDER — POTASSIUM CHLORIDE 2 MEQ/ML IV SOLN: INTRAVENOUS | Status: DC

## 2017-10-07 NOTE — Progress Notes (Signed)
SLP Cancellation Note  Patient Details Name: Betty Nelson MRN: [id number] DOB: 1960/11/06   Cancelled treatment:       Reason Eval/Treat Not Completed: Patient at procedure or test/unavailable. Pt currently off unit for testing. Per PA notes, pt is slightly improved today, but continues to be lethargic. ST will continue efforts, providing po trials if/when pt tolerates.  Leanette Eutsler B. Murvin NatalBueche, [facility] CenterMSP, CCC-SLP Speech Language Pathologist [phone]  Leigh AuroraBueche, Sameeha Rockefeller Brown 10/07/2017, 10:42 AM

## 2017-10-07 NOTE — Care Management Note (Signed)
Case Management Note  Patient Details  Name: Betty Nelson MRN: [id number] Date of Birth: [birth year]  Subjective/Objective:      Pt admitted with Todds Paralysis.  She is from home with her spouse.           Action/Plan: MD please order PT/OT evals as needed once less confused. CM following for d/c needs, physician orders.   Expected Discharge Date:                  Expected Discharge Plan:     In-House Referral:     Discharge planning Services     Post Acute Care Choice:    Choice offered to:     DME Arranged:    DME Agency:     HH Arranged:    HH Agency:     Status of Service:  In process, will continue to follow  If discussed at Long Length of Stay Meetings, dates discussed:    Additional Comments:  Kermit BaloKelli F Yuko Coventry, RN 10/07/2017, 11:44 AM

## 2017-10-07 NOTE — Progress Notes (Addendum)
  Speech Language Pathology Treatment: Dysphagia  Patient Details Name: Betty Nelson G Kinslow MRN: [id number] DOB: July 14, 1960 Today's Date: 10/07/2017 Time: [id number] SLP Time Calculation (min) (ACUTE ONLY): 35 min  Assessment / Plan / Recommendation Clinical Impression  Pt seen at bedside for follow up after BSE completed 10/06/17. Pt more alert today, but continues to be significantly confused and distractible, pulling at mittens, trying to eat or remove pulse ox. Pt continues to have difficulty following commands consistently. She was able to accept trials of thin liquid, nectar thick liquid, puree, and solids. Reduced bolus awareness, extended oral prep and suspected delay of swallow reflex, with occasional wet voice quality noted following thin, nectar, and puree consistencies. Ineffective mastication of solid was noted, although pt's daughter reports pt tolerated a regular diet prior to admit without dentition. Pt alertness and bedside presentation have improved since yesterday, however, mentation continues to be a barrier to safe po intake.   RN reports all meds are currently via IV. If po meds are initiated, recommend crushed in puree if possible when pt is fully awake and alert. Objective assessment via MBS may be beneficial prior to initiation of po diet, given inconsistent s/s aspiration on several consistencies.    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 CXR clear, CT Head without acute findings, MRI pending.      SLP Plan  Continue with current plan of care;MBS       Recommendations  Diet recommendations: NPO(crushed meds if there are critical po meds) Medication Administration: Crushed with puree                Oral Care Recommendations: Oral care QID Follow up Recommendations: (TBD) SLP Visit Diagnosis: Dysphagia, unspecified (R13.10) Plan: Continue with current plan of care;MBS        GO               Kenslei Hearty B. Murvin NatalBueche, [facility] St. LouisMSP, CCC-SLP Speech Language Pathologist [phone]  Leigh AuroraBueche, Blondine Hottel Brown 10/07/2017, 12:49 PM

## 2017-10-07 NOTE — Progress Notes (Addendum)
Subjective:  Patient is slightly better today.  She is able to follow simple commands such as touching her nose, lifting her arms, opening her eyes and attempting to tell me where she is.  She does know that she is in the hospital but then when asked city and state she perseverates on hospital.  [place]. She is exhibits cognitive slowing.   Exam: Vitals:   10/07/17 0000 10/07/17 0328  BP: (!) 166/82 (!) 198/91  Pulse: 98 91  Resp: 20 20  Temp: 98.2 F (36.8 C) 98.7 F (37.1 C)  SpO2: 100% 100%    Physical Exam   HEENT-  Normocephalic, no lesions, without obvious abnormality.  Normal external eye and conjunctiva.   Extremities- Warm, dry and intact Musculoskeletal-no joint tenderness, deformity or swelling Skin-warm and dry, no hyperpigmentation, vitiligo, or suspicious lesions  Neuro:  Mental Status: As noted as above she exhibits cognitive slowing.  Upon wakening she does open her eyes and attempting to answer questions she knows she is in the hospital, when attempting to ask further questions at that time she perseverates on the hospital.  Otherwise she does not know where she is - [place].  She is able to follow simple commands such as touching her nose and also raising her arms. She is showing bradyphenia when attempting to answer my questions. Cranial Nerves: II: Blinks to threat bilaterally III,IV, VI: ptosis not present, extra-ocular motions intact bilaterally pupils equal, round, reactive to light and accommodation V,VII: smile symmetric, facial light touch sensation normal bilaterally VIII: hearing normal bilaterally Motor: Maximum elicitable strength is 3/5 throughout; when attempting to do formal testing she does not take part in this. Sensory: Responds to noxious stimuli all extremities Deep Tendon Reflexes: 2+ and symmetric throughout Plantars: Right: downgoing   Left: downgoing Cerebellar: No ataxia with  finger-to-nose but has bradykinesia while performing Gait: Not tested    Medications:  Scheduled: . folic acid  1 mg Intravenous Daily  . heparin  5,000 Units Subcutaneous Q8H  . loratadine  10 mg Oral Daily  . mouth rinse  15 mL Mouth Rinse BID  . metoprolol tartrate  5 mg Intravenous Q8H  . sodium chloride flush  3 mL Intravenous Q12H    Pertinent Labs/Diagnostics: Potassium 2.9 CO2 20 BUN less than 5 Calcium 7.9 AST 64  Felicie MornDavid Smith PA-C [facility] [phone]   Assessment: 57 year old female with baseline alcohol abuse, presenting with confusion after stopping alcohol.  Patient also was taking 2 Xanax a day and stopped taking her Xanax at the same time. Differential includes: -Encephalopathy secondary to benzodiazepine/EtOH withdrawal.  At this time she seems to be improving - As noted EEG did not show any epileptiform activity--as she is awake and answering questions I do not believe she is actively seizing - Possible press/stroke -hopefully will obtain MRI today.    Recommendations: -MRI brain -Continue to follow-up on labs -Replete potassium -Continue CIWA protocol   Electronically signed: Dr. Caryl PinaEric Honora Searson 10/07/2017, 9:38 AM

## 2017-10-07 NOTE — Progress Notes (Addendum)
PROGRESS NOTE  Betty Nelson ZOX:[id number] DOB: Oct 29, 1960 DOA: 10/05/2017 PCP: Remus Loffler, PA-C   LOS: 2 days   Brief Narrative / Interim history: 57 year old female with history of alcohol abuse, depression, hypertension who was brought to the hospital on 6/30 because he was found down in her home with apparent seizures. Patient's husband mentioned that she has been having slight memory problems over the last 4 to 5 weeks. In the past 2-3 days prior to hospitalization, he has noticed her to be a little bit less interactive, decreased appetite, and has not had any alcohol since that day. She is also taking Xanax and hydrocodone, husband is not sure how much or whether she ran out.  Neurology is consulted and they are following.  Assessment & Plan: Principal Problem:   Todd's paralysis (HCC) Active Problems:   Essential hypertension   Alcohol abuse   Protein calorie malnutrition (HCC)   Depression   Malnutrition of moderate degree   Seizures -Possibly in the setting of alcohol withdrawal with Todd's paralysis given right-sided weakness -Neurology consulted, following, appreciate input -EEG without active seizures, patient is on Keppra and will continue -CT angiogram without significant blockages -MRI of the brain pending, intermittent agitation has prevented this from being done but hopefully will get it either today or tomorrow -She is now seizure-free  Alcohol abuse -Placed on CIWA, continue, slight elevation of the LFTs are in a pattern consistent with ongoing alcohol use -Still triggering CIWA overnight however looks less restless than she was in the past couple of days.  She is now oriented to place but not time.  Acute metabolic encephalopathy -Primarily metabolic in the setting of alcohol withdrawal.  Check vitamin B1.  LP was unremarkable for any infection, EEG without any epileptiform activity, CTA of the head and neck were normal -Continue high-dose IV  thiamine -Confusion improving, hopefully we can get her off restraints today  Increased anion gap metabolic acidosis -Likely due to seizures, resolved  Hypokalemia -In the setting of alcohol abus still low despite repletion, change IV fluidse -With potassium containing, replete mag again  Memory problems /increased weakness /poor appetite over the last few days -Unclear cause,?  Related to home medications, does not appear to have any ongoing infections.  TSH normal. Check B1 given alcohol abuse and replete thiamine high-dose for 3 days, today day 3  Hypochloremic hyponatremia -Appears dehydrated, patient with poor p.o. intake over the last 3 to 4 days, continue IV fluids -Sodium now normalized   DVT prophylaxis: heparin Code Status: Full code Family Communication: no family at bedside, d/w husband over the phone last night Disposition Plan: TBD  Consultants:   Neurology   Procedures:   LP 6/30  Antimicrobials:  None    Subjective: -Remains confused, intermittently follows commands, she does not answer most of my questions.  Objective: Vitals:   10/06/17 1555 10/06/17 1947 10/07/17 0000 10/07/17 0328  BP: (!) 176/96 (!) 160/95 (!) 166/82 (!) 198/91  Pulse: 94 (!) 102 98 91  Resp: 19 19 20 20   Temp: (!) 97.4 F (36.3 C) 98.3 F (36.8 C) 98.2 F (36.8 C) 98.7 F (37.1 C)  TempSrc: Axillary Axillary Oral Axillary  SpO2: 100% 100% 100% 100%  Weight:      Height:        Intake/Output Summary (Last 24 hours) at 10/07/2017 0929 Last data filed at 10/07/2017 0147 Gross per 24 hour  Intake 2198.56 ml  Output -  Net 2198.56 ml   Filed  Weights   10/05/17 0739  Weight: 45.4 kg (100 lb)    Examination:  Constitutional: No distress, appears much calmer this morning Eyes: No scleral icterus Respiratory: Clear to auscultation bilaterally without wheezing or crackles.  Normal respiratory effort. Cardiovascular: Regular rate and rhythm without murmurs.  No peripheral  edema. Abdomen: Soft, nontender, nondistended, positive bowel sounds Skin: No rashes seen Neurologic: Alert to place but not to time, equal strength, follows commands, exam is nonfocal  Data Reviewed: I have independently reviewed following labs and imaging studies   CBC: Recent Labs  Lab 10/05/17 0903 10/05/17 1350 10/06/17 0655 10/07/17 0554  WBC 9.0 7.7 6.2 5.4  NEUTROABS 7.7  --   --   --   HGB 15.1* 12.3 13.3 11.6*  HCT 43.4 34.2* 37.2 33.9*  MCV 94.8 91.2 90.1 93.6  PLT 172 162 177 177   Basic Metabolic Panel: Recent Labs  Lab 10/05/17 0903 10/05/17 1350 10/06/17 0655 10/07/17 0554  NA 131*  --  134* 135  K 4.0  --  3.0* 2.9*  CL 95*  --  99 105  CO2 19*  --  19* 20*  GLUCOSE 162*  --  97 79  BUN 7  --  5* <5*  CREATININE 0.74 0.72 0.74 0.76  CALCIUM 9.1  --  8.0* 7.9*  MG  --  1.3*  --  1.9  PHOS  --  3.4  --  2.8   GFR: Estimated Creatinine Clearance: 54.3 mL/min (by C-G formula based on SCr of 0.76 mg/dL). Liver Function Tests: Recent Labs  Lab 10/05/17 0903 10/07/17 0554  AST 58* 64*  ALT 28 31  ALKPHOS 63 40  BILITOT 1.2 1.3*  PROT 8.8* 6.7  ALBUMIN 4.6 3.6   No results for input(s): LIPASE, AMYLASE in the last 168 hours. Recent Labs  Lab 10/05/17 1739  AMMONIA 52*   Coagulation Profile: No results for input(s): INR, PROTIME in the last 168 hours. Cardiac Enzymes: No results for input(s): CKTOTAL, CKMB, CKMBINDEX, TROPONINI in the last 168 hours. BNP (last 3 results) No results for input(s): PROBNP in the last 8760 hours. HbA1C: No results for input(s): HGBA1C in the last 72 hours. CBG: Recent Labs  Lab 10/05/17 0743  GLUCAP 175*   Lipid Profile: No results for input(s): CHOL, HDL, LDLCALC, TRIG, CHOLHDL, LDLDIRECT in the last 72 hours. Thyroid Function Tests: Recent Labs    10/05/17 0903  TSH 1.691   Anemia Panel: Recent Labs    10/05/17 1429  VITAMINB12 301   Urine analysis:    Component Value Date/Time    COLORURINE YELLOW 10/05/2017 0819   APPEARANCEUR CLEAR 10/05/2017 0819   LABSPEC 1.012 10/05/2017 0819   PHURINE 6.0 10/05/2017 0819   GLUCOSEU 50 (A) 10/05/2017 0819   HGBUR SMALL (A) 10/05/2017 0819   BILIRUBINUR NEGATIVE 10/05/2017 0819   KETONESUR NEGATIVE 10/05/2017 0819   PROTEINUR >=300 (A) 10/05/2017 0819   NITRITE NEGATIVE 10/05/2017 0819   LEUKOCYTESUR NEGATIVE 10/05/2017 0819   Sepsis Labs: Invalid input(s): PROCALCITONIN, LACTICIDVEN  Recent Results (from the past 240 hour(s))  CSF culture     Status: None (Preliminary result)   Collection Time: 10/05/17  5:15 PM  Result Value Ref Range Status   Specimen Description CSF TUBE 2  Final   Special Requests NONE  Final   Gram Stain   Final    WBC PRESENT,BOTH PMN AND MONONUCLEAR NO ORGANISMS SEEN CYTOSPIN SMEAR Performed at [facility], [address] [place],  [place] [address]    Culture PENDING  Incomplete   Report Status PENDING  Incomplete      Radiology Studies: Ct Angio Head W Or Wo Contrast  Result Date: 10/05/2017 CLINICAL DATA:  Code stroke. Evaluate posterior circulation, speech difficulty. EXAM: CT HEAD WITHOUT CONTRAST CT ANGIOGRAPHY HEAD AND NECK TECHNIQUE: Multidetector CT imaging of the head and neck was performed using the standard protocol during bolus administration of intravenous contrast. Multiplanar CT image reconstructions and MIPs were obtained to evaluate the vascular anatomy. Carotid stenosis measurements (when applicable) are obtained utilizing NASCET criteria, using the distal internal carotid diameter as the denominator. CONTRAST:  50mL ISOVUE-370 IOPAMIDOL (ISOVUE-370) INJECTION 76% COMPARISON:  10/05/2017 CT head. 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 Right occipital lobe small cortical lucency without volume loss, possibly acute or early subacute infarction. Stable age indeterminate lacunar infarction within the  right lentiform nucleus. No hemorrhage, mass effect, extra-axial collection, hydrocephalus, or herniation. Vascular: As below. Skull: Normal. Negative for fracture or focal lesion. Sinuses: Imaged portions are clear. Orbits: No acute finding. Review of the MIP images confirms the above findings CTA NECK FINDINGS Aortic arch: Standard branching. Imaged portion shows no evidence of aneurysm or dissection. 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 Right carotid system: No evidence of dissection, stenosis (50% or greater) or occlusion. Moderate calcified plaque of the carotid bifurcation with mild less 50% proximal ICA stenosis. Left carotid system: No evidence of dissection, stenosis (50% or greater) or occlusion. Moderate calcified plaque of the left common carotid artery and the carotid bifurcation with minimal less than 30% stenosis. Vertebral arteries: Right dominant vertebral artery system. Widely patent right vertebral artery. 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 The vertebral artery downstream to the C4 level is diminutive, but patent. At the C2 level the patent lumen is thread-like (series 8, image 110) and the vessel itself is mildly widened suggesting dissection. Skeleton: Moderate cervical spondylosis greatest at the C4-C6 levels. No high-grade bony canal stenosis. Other neck: 11 mm calcified nodule within the left thyroid gland. Upper chest: Negative. Review of the MIP images confirms the above findings CTA HEAD FINDINGS Anterior circulation: No significant stenosis, proximal occlusion, aneurysm, or vascular malformation. Calcified plaque of the carotid siphons with mild left distal cavernous stenosis. Posterior circulation: No significant stenosis, proximal occlusion, aneurysm, or vascular malformation. Venous sinuses: As permitted by contrast timing, patent. Anatomic variants: Complete circle-of-Willis. Review of the MIP images confirms  the above findings IMPRESSION: CT head: 1. 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 2. No new stroke, hemorrhage, or mass effect identified. CTA neck: 1. 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 Patent vessel downstream to the C4 level. 2. Thread-like severe stenosis of left vertebral artery at the C2 level with mild widening of the vessel may represent dissection. 3. Patent bilateral carotid systems and right vertebral artery. No dissection, aneurysm, or significant stenosis by NASCET criteria. CTA head: 1. No large vessel occlusion, aneurysm, or significant stenosis is identified. 2. 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 These results were called by telephone at the time of interpretation on 10/05/2017 at 4:23 pm to Dr. Ritta Slot, who verbally acknowledged these results. Electronically Signed   By: Mitzi Hansen M.D.   On: 10/05/2017 16:24   Ct Angio  Neck W Or Wo Contrast  Result Date: 10/05/2017 CLINICAL DATA:  Code stroke. Evaluate posterior circulation, speech difficulty. EXAM: CT HEAD WITHOUT CONTRAST CT ANGIOGRAPHY HEAD AND NECK TECHNIQUE: Multidetector CT imaging of the head and neck was performed using the standard protocol during bolus administration of intravenous contrast. Multiplanar CT image reconstructions and MIPs were obtained to evaluate the vascular anatomy. Carotid stenosis measurements (when applicable) are obtained utilizing NASCET criteria, using the distal internal carotid diameter as the denominator. CONTRAST:  50mL ISOVUE-370 IOPAMIDOL (ISOVUE-370) INJECTION 76% COMPARISON:  10/05/2017 CT head. 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 Right occipital lobe small cortical lucency without volume loss, possibly  acute or early subacute infarction. Stable age indeterminate lacunar infarction within the right lentiform nucleus. No hemorrhage, mass effect, extra-axial collection, hydrocephalus, or herniation. Vascular: As below. Skull: Normal. Negative for fracture or focal lesion. Sinuses: Imaged portions are clear. Orbits: No acute finding. Review of the MIP images confirms the above findings CTA NECK FINDINGS Aortic arch: Standard branching. Imaged portion shows no evidence of aneurysm or dissection. 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 Right carotid system: No evidence of dissection, stenosis (50% or greater) or occlusion. Moderate calcified plaque of the carotid bifurcation with mild less 50% proximal ICA stenosis. Left carotid system: No evidence of dissection, stenosis (50% or greater) or occlusion. Moderate calcified plaque of the left common carotid artery and the carotid bifurcation with minimal less than 30% stenosis. Vertebral arteries: Right dominant vertebral artery system. Widely patent right vertebral artery. 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 The vertebral artery downstream to the C4 level is diminutive, but patent. At the C2 level the patent lumen is thread-like (series 8, image 110) and the vessel itself is mildly widened suggesting dissection. Skeleton: Moderate cervical spondylosis greatest at the C4-C6 levels. No high-grade bony canal stenosis. Other neck: 11 mm calcified nodule within the left thyroid gland. Upper chest: Negative. Review of the MIP images confirms the above findings CTA HEAD FINDINGS Anterior circulation: No significant stenosis, proximal occlusion, aneurysm, or vascular malformation. Calcified plaque of the carotid siphons with mild left distal cavernous stenosis. Posterior circulation: No significant stenosis, proximal occlusion, aneurysm, or vascular malformation. Venous sinuses: As permitted by contrast  timing, patent. Anatomic variants: Complete circle-of-Willis. Review of the MIP images confirms the above findings IMPRESSION: CT head: 1. 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 2. No new stroke, hemorrhage, or mass effect identified. CTA neck: 1. 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 Patent vessel downstream to the C4 level. 2. Thread-like severe stenosis of left vertebral artery at the C2 level with mild widening of the vessel may represent dissection. 3. Patent bilateral carotid systems and right vertebral artery. No dissection, aneurysm, or significant stenosis by NASCET criteria. CTA head: 1. No large vessel occlusion, aneurysm, or significant stenosis is identified. 2. 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 These results were called by telephone at the time of interpretation on 10/05/2017 at 4:23 pm to Dr. Ritta Slot, who verbally acknowledged these results. Electronically Signed   By: Mitzi Hansen M.D.   On: 10/05/2017 16:24   Ct Head Code Stroke Wo Contrast  Result Date: 10/05/2017 CLINICAL DATA:  Code stroke. Evaluate posterior circulation, speech difficulty. EXAM: CT HEAD WITHOUT CONTRAST CT ANGIOGRAPHY  HEAD AND NECK TECHNIQUE: Multidetector CT imaging of the head and neck was performed using the standard protocol during bolus administration of intravenous contrast. Multiplanar CT image reconstructions and MIPs were obtained to evaluate the vascular anatomy. Carotid stenosis measurements (when applicable) are obtained utilizing NASCET criteria, using the distal internal carotid diameter as the denominator. CONTRAST:  50mL ISOVUE-370 IOPAMIDOL (ISOVUE-370) INJECTION 76% COMPARISON:  10/05/2017 CT head. FINDINGS: CT HEAD FINDINGS Brain: Right superior parietal cortex stable encephalomalacia,  likely chronic infarct. Right occipital lobe small cortical lucency without volume loss, possibly acute or early subacute infarction. Stable age indeterminate lacunar infarction within the right lentiform nucleus. No hemorrhage, mass effect, extra-axial collection, hydrocephalus, or herniation. Vascular: As below. Skull: Normal. Negative for fracture or focal lesion. Sinuses: Imaged portions are clear. Orbits: No acute finding. Review of the MIP images confirms the above findings CTA NECK FINDINGS Aortic arch: Standard branching. Imaged portion shows no evidence of aneurysm or dissection. 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 Right carotid system: No evidence of dissection, stenosis (50% or greater) or occlusion. Moderate calcified plaque of the carotid bifurcation with mild less 50% proximal ICA stenosis. Left carotid system: No evidence of dissection, stenosis (50% or greater) or occlusion. Moderate calcified plaque of the left common carotid artery and the carotid bifurcation with minimal less than 30% stenosis. Vertebral arteries: Right dominant vertebral artery system. Widely patent right vertebral artery. 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 The vertebral artery downstream to the C4 level is diminutive, but patent. At the C2 level the patent lumen is thread-like (series 8, image 110) and the vessel itself is mildly widened suggesting dissection. Skeleton: Moderate cervical spondylosis greatest at the C4-C6 levels. No high-grade bony canal stenosis. Other neck: 11 mm calcified nodule within the left thyroid gland. Upper chest: Negative. Review of the MIP images confirms the above findings CTA HEAD FINDINGS Anterior circulation: No significant stenosis, proximal occlusion, aneurysm, or vascular malformation. Calcified plaque of the carotid siphons with mild left distal cavernous stenosis. Posterior circulation: No significant stenosis,  proximal occlusion, aneurysm, or vascular malformation. Venous sinuses: As permitted by contrast timing, patent. Anatomic variants: Complete circle-of-Willis. Review of the MIP images confirms the above findings IMPRESSION: CT head: 1. 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 2. No new stroke, hemorrhage, or mass effect identified. CTA neck: 1. 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 Patent vessel downstream to the C4 level. 2. Thread-like severe stenosis of left vertebral artery at the C2 level with mild widening of the vessel may represent dissection. 3. Patent bilateral carotid systems and right vertebral artery. No dissection, aneurysm, or significant stenosis by NASCET criteria. CTA head: 1. No large vessel occlusion, aneurysm, or significant stenosis is identified. 2. 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 These results were called by telephone at the time of interpretation on 10/05/2017 at 4:23 pm to Dr. Ritta SlotMCNEILL KIRKPATRICK, who verbally acknowledged these results. Electronically Signed   By: Mitzi HansenLance  Furusawa-Stratton M.D.   On: 10/05/2017 16:24     Scheduled Meds: . folic acid  1 mg Intravenous Daily  . heparin  5,000 Units Subcutaneous Q8H  . loratadine  10 mg Oral Daily  . mouth rinse  15 mL Mouth Rinse BID  . metoprolol tartrate  5 mg Intravenous Q8H  . sodium chloride flush  3 mL Intravenous Q12H   Continuous Infusions: . dextrose 5 % and 0.45 % NaCl with KCl 20 mEq/L    . levETIRAcetam 500 mg (10/07/17 0600)  . magnesium sulfate 1 - 4 g bolus IVPB    . thiamine injection 500 mg (10/06/17 2152)    Pamella Pert, MD, PhD [facility] Pager [phone]  If 7PM-7AM, please contact night-coverage [email or web address] Password [email or web address] 10/07/2017, 9:29 AM

## 2017-10-08 ENCOUNTER — Encounter (HOSPITAL_COMMUNITY): Payer: Self-pay

## 2017-10-08 DIAGNOSIS — F1010 Alcohol abuse, uncomplicated: Secondary | ICD-10-CM

## 2017-10-08 DIAGNOSIS — G8384 Todd's paralysis (postepileptic): Principal | ICD-10-CM

## 2017-10-08 DIAGNOSIS — G9340 Encephalopathy, unspecified: Secondary | ICD-10-CM

## 2017-10-08 DIAGNOSIS — E43 Unspecified severe protein-calorie malnutrition: Secondary | ICD-10-CM

## 2017-10-08 LAB — VARICELLA-ZOSTER BY PCR: Varicella-Zoster, PCR: NEGATIVE

## 2017-10-08 LAB — CBC
HCT: 31.9 % — ABNORMAL LOW (ref 36.0–46.0)
Hemoglobin: 11.1 g/dL — ABNORMAL LOW (ref 12.0–15.0)
MCH: 32.1 pg (ref 26.0–34.0)
MCHC: 34.8 g/dL (ref 30.0–36.0)
MCV: 92.2 fL (ref 78.0–100.0)
PLATELETS: 194 10*3/uL (ref 150–400)
RBC: 3.46 MIL/uL — AB (ref 3.87–5.11)
RDW: 17.8 % — ABNORMAL HIGH (ref 11.5–15.5)
WBC: 5.7 10*3/uL (ref 4.0–10.5)

## 2017-10-08 LAB — PHOSPHORUS: PHOSPHORUS: 2.6 mg/dL (ref 2.5–4.6)

## 2017-10-08 LAB — BASIC METABOLIC PANEL
Anion gap: 8 (ref 5–15)
BUN: <5 mg/dL — ABNORMAL LOW (ref 6–20)
CALCIUM: 7.8 mg/dL — AB (ref 8.9–10.3)
CHLORIDE: 103 mmol/L (ref 98–111)
CO2: 22 mmol/L (ref 22–32)
Creatinine, Ser: 0.65 mg/dL (ref 0.44–1.00)
GFR calc non Af Amer: >60 mL/min (ref >60)
Glucose, Bld: 96 mg/dL (ref 70–99)
Potassium: 3.1 mmol/L — ABNORMAL LOW (ref 3.5–5.1)
SODIUM: 133 mmol/L — AB (ref 135–145)

## 2017-10-08 LAB — CSF CULTURE W GRAM STAIN: Culture: NO GROWTH

## 2017-10-08 LAB — MAGNESIUM: Magnesium: 1.8 mg/dL (ref 1.7–2.4)

## 2017-10-08 LAB — VITAMIN B1: Vitamin B1 (Thiamine): 225.7 nmol/L — ABNORMAL HIGH (ref 66.5–200.0)

## 2017-10-08 LAB — CSF CULTURE

## 2017-10-08 MED ORDER — TRAMADOL HCL 50 MG PO TABS: 50.0000 mg | ORAL_TABLET | Freq: Once | ORAL | Status: DC

## 2017-10-08 MED ORDER — LEVETIRACETAM 500 MG PO TABS: 500.0000 mg | ORAL_TABLET | Freq: Two times a day (BID) | ORAL | Status: DC

## 2017-10-08 MED ORDER — FOLIC ACID 1 MG PO TABS
1.0000 mg | ORAL_TABLET | Freq: Every day | ORAL | Status: DC
  Administered 2017-10-09 – 2017-10-10 (×2): 1 mg via ORAL
  Filled 2017-10-08 (×2): qty 1

## 2017-10-08 MED ORDER — POTASSIUM CHLORIDE CRYS ER 20 MEQ PO TBCR
40.0000 meq | EXTENDED_RELEASE_TABLET | Freq: Once | ORAL | Status: AC
  Administered 2017-10-08: 40 meq via ORAL
  Filled 2017-10-08: qty 2

## 2017-10-08 MED ORDER — NICOTINE 21 MG/24HR TD PT24
21.0000 mg | MEDICATED_PATCH | Freq: Every day | TRANSDERMAL | Status: DC
  Administered 2017-10-08 – 2017-10-10 (×3): 21 mg via TRANSDERMAL
  Filled 2017-10-08 (×3): qty 1

## 2017-10-08 MED ORDER — METOPROLOL TARTRATE 25 MG PO TABS
25.0000 mg | ORAL_TABLET | Freq: Two times a day (BID) | ORAL | Status: DC
  Administered 2017-10-08 – 2017-10-10 (×5): 25 mg via ORAL
  Filled 2017-10-08 (×5): qty 1

## 2017-10-08 MED ORDER — MAGNESIUM SULFATE 2 GM/50ML IV SOLN
2.0000 g | Freq: Once | INTRAVENOUS | Status: AC
  Administered 2017-10-08: 2 g via INTRAVENOUS
  Filled 2017-10-08: qty 50

## 2017-10-08 MED ORDER — ACETAMINOPHEN 325 MG PO TABS
650.0000 mg | ORAL_TABLET | Freq: Four times a day (QID) | ORAL | Status: DC | PRN
  Administered 2017-10-09 – 2017-10-10 (×4): 650 mg via ORAL
  Filled 2017-10-08 (×5): qty 2

## 2017-10-08 NOTE — Progress Notes (Signed)
PROGRESS NOTE        PATIENT DETAILS Name: Betty Nelson Age: 57 y.o. Sex: female Date of Birth: Apr 08, 1961 Admit Date: 10/05/2017 Admitting Physician No admitting provider for patient encounter. ZOX:WRUEA, Wayland Salinas, PA-C  Brief Narrative: Patient is a 57 y.o. female with long-standing history of alcohol use, depression, chronic benzodiazepine use any pain hospital after she was found down in the home from apparent seizures.  Subsequently transferred to [facility] for further evaluation.  Hospital course complicated by apathy that is slowly improving with supportive care.  Currently thought to have alcohol withdrawal/benzodiazepine withdrawal seizures.  Subjective: Much more awake and alert when compared to the past few days-asking for food.  Assessment/Plan: Seizures: In the setting of alcohol and benzodiazepine withdrawal.  Spoke with neurology-Dr. Otelia Limes over the phone-okay to stop Keppra, continue as needed benzodiazepines.   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  MRI brain negative for CVA.  Acute metabolic encephalopathy: Significantly improved-in a setting of alcohol withdrawal, postictal state from seizures.  MRI brain without any acute abnormalities.  EEG negative for seizures.  Alcohol abuse:Continue with Ativan per CIWA protocol.  Counseled-not sure if she has any intention of quitting at this time.  Hypokalemia: Replete and recheck.  Mild hyponatremia: Doubt further work-up required.  Moderate malnutrition: Continue supplements  DVT Prophylaxis: Prophylactic Heparin  Code Status: Full code   Family Communication: None at bedside  Disposition Plan: Remain inpatient-await evaluation by rehab services but may require home health services on discharge, discharge in the next few days if clinical improvement continues.  Antimicrobial agents: Anti-infectives (From admission, onward)   None       Procedures: None  CONSULTS:  neurology  Time spent: 25- minutes-Greater than 50% of this time was spent in counseling, explanation of diagnosis, planning of further management, and coordination of care.  MEDICATIONS: Scheduled Meds: . [START ON 10/09/2017] folic acid  1 mg Oral Daily  . heparin  5,000 Units Subcutaneous Q8H  . levETIRAcetam  500 mg Oral BID  . loratadine  10 mg Oral Daily  . mouth rinse  15 mL Mouth Rinse BID  . metoprolol tartrate  5 mg Intravenous Q8H  . nicotine  21 mg Transdermal Daily  . sodium chloride flush  3 mL Intravenous Q12H   Continuous Infusions: . dextrose 5 % and 0.45 % NaCl with KCl 20 mEq/L 100 mL/hr at 10/08/17 0614   PRN Meds:.hydrALAZINE, LORazepam, ondansetron **OR** ondansetron (ZOFRAN) IV   PHYSICAL EXAM: Vital signs: Vitals:   10/07/17 2354 10/08/17 0400 10/08/17 0803 10/08/17 1200  BP: (!) 193/102 (!) 186/98 (!) 161/100 (!) 175/82  Pulse: 93 88 (!) 101 (!) 106  Resp: 20 16 14 16   Temp: 97.9 F (36.6 C) 97.9 F (36.6 C) 97.9 F (36.6 C) 98.4 F (36.9 C)  TempSrc: Oral Oral Oral Oral  SpO2:   93% 100%  Weight:      Height:       Filed Weights   10/05/17 0739  Weight: 45.4 kg (100 lb)   Body mass index is 17.71 kg/m.   General appearance :Awake, alert not in any distress Eyes:Pink conjunctiva HEENT: Atraumatic and Normocephalic Neck: supple, no JVD. No cervical lymphadenopathy. No thyromegaly Resp:Good air entry bilaterally, no added sounds  CVS: S1 S2 regular, no murmurs.  GI: Bowel sounds present, Non tender and  not distended with no gaurding, rigidity or rebound.No organomegaly Extremities: B/L Lower Ext shows no edema, both legs are warm to touch Neurology: Nonfocal Musculoskeletal:No digital cyanosis Skin:No Rash, warm and dry Wounds:N/A  I have personally reviewed following labs and imaging studies  LABORATORY DATA: CBC: Recent Labs  Lab 10/05/17 0903 10/05/17 1350 10/06/17 0655 10/07/17 0554  10/08/17 0424  WBC 9.0 7.7 6.2 5.4 5.7  NEUTROABS 7.7  --   --   --   --   HGB 15.1* 12.3 13.3 11.6* 11.1*  HCT 43.4 34.2* 37.2 33.9* 31.9*  MCV 94.8 91.2 90.1 93.6 92.2  PLT 172 162 177 177 194    Basic Metabolic Panel: Recent Labs  Lab 10/05/17 0903 10/05/17 1350 10/06/17 0655 10/07/17 0554 10/08/17 0424  NA 131*  --  134* 135 133*  K 4.0  --  3.0* 2.9* 3.1*  CL 95*  --  99 105 103  CO2 19*  --  19* 20* 22  GLUCOSE 162*  --  97 79 96  BUN 7  --  5* <5* <5*  CREATININE 0.74 0.72 0.74 0.76 0.65  CALCIUM 9.1  --  8.0* 7.9* 7.8*  MG  --  1.3*  --  1.9 1.8  PHOS  --  3.4  --  2.8 2.6    GFR: Estimated Creatinine Clearance: 54.3 mL/min (by C-G formula based on SCr of 0.65 mg/dL).  Liver Function Tests: Recent Labs  Lab 10/05/17 0903 10/07/17 0554  AST 58* 64*  ALT 28 31  ALKPHOS 63 40  BILITOT 1.2 1.3*  PROT 8.8* 6.7  ALBUMIN 4.6 3.6   No results for input(s): LIPASE, AMYLASE in the last 168 hours. Recent Labs  Lab 10/05/17 1739  AMMONIA 52*    Coagulation Profile: No results for input(s): INR, PROTIME in the last 168 hours.  Cardiac Enzymes: No results for input(s): CKTOTAL, CKMB, CKMBINDEX, TROPONINI in the last 168 hours.  BNP (last 3 results) No results for input(s): PROBNP in the last 8760 hours.  HbA1C: No results for input(s): HGBA1C in the last 72 hours.  CBG: Recent Labs  Lab 10/05/17 0743  GLUCAP 175*    Lipid Profile: No results for input(s): CHOL, HDL, LDLCALC, TRIG, CHOLHDL, LDLDIRECT in the last 72 hours.  Thyroid Function Tests: No results for input(s): TSH, T4TOTAL, FREET4, T3FREE, THYROIDAB in the last 72 hours.  Anemia Panel: No results for input(s): VITAMINB12, FOLATE, FERRITIN, TIBC, IRON, RETICCTPCT in the last 72 hours.  Urine analysis:    Component Value Date/Time   COLORURINE YELLOW 10/05/2017 0819   APPEARANCEUR CLEAR 10/05/2017 0819   LABSPEC 1.012 10/05/2017 0819   PHURINE 6.0 10/05/2017 0819   GLUCOSEU 50  (A) 10/05/2017 0819   HGBUR SMALL (A) 10/05/2017 0819   BILIRUBINUR NEGATIVE 10/05/2017 0819   KETONESUR NEGATIVE 10/05/2017 0819   PROTEINUR >=300 (A) 10/05/2017 0819   NITRITE NEGATIVE 10/05/2017 0819   LEUKOCYTESUR NEGATIVE 10/05/2017 0819    Sepsis Labs: Lactic Acid, Venous    Component Value Date/Time   LATICACIDVEN 3.1 (HH) 10/05/2017 1739    MICROBIOLOGY: Recent Results (from the past 240 hour(s))  CSF culture     Status: None   Collection Time: 10/05/17  5:15 PM  Result Value Ref Range Status   Specimen Description CSF TUBE 2  Final   Special Requests NONE  Final   Gram Stain   Final    WBC PRESENT,BOTH PMN AND MONONUCLEAR NO ORGANISMS SEEN CYTOSPIN SMEAR    Culture  Final    NO GROWTH 3 DAYS Performed at [facility], [address], [place], [place] [address]    Report Status 10/08/2017 FINAL  Final    RADIOLOGY STUDIES/RESULTS: Ct Angio Head W Or Wo Contrast  Result Date: 10/05/2017 CLINICAL DATA:  Code stroke. Evaluate posterior circulation, speech difficulty. EXAM: CT HEAD WITHOUT CONTRAST CT ANGIOGRAPHY HEAD AND NECK TECHNIQUE: Multidetector CT imaging of the head and neck was performed using the standard protocol during bolus administration of intravenous contrast. Multiplanar CT image reconstructions and MIPs were obtained to evaluate the vascular anatomy. Carotid stenosis measurements (when applicable) are obtained utilizing NASCET criteria, using the distal internal carotid diameter as the denominator. CONTRAST:  50mL ISOVUE-370 IOPAMIDOL (ISOVUE-370) INJECTION 76% COMPARISON:  10/05/2017 CT head. 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 Right occipital lobe small cortical lucency without volume loss, possibly acute or early subacute infarction. Stable age indeterminate lacunar infarction within the right lentiform nucleus. No hemorrhage, mass effect, extra-axial collection,  hydrocephalus, or herniation. Vascular: As below. Skull: Normal. Negative for fracture or focal lesion. Sinuses: Imaged portions are clear. Orbits: No acute finding. Review of the MIP images confirms the above findings CTA NECK FINDINGS Aortic arch: Standard branching. Imaged portion shows no evidence of aneurysm or dissection. 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 Right carotid system: No evidence of dissection, stenosis (50% or greater) or occlusion. Moderate calcified plaque of the carotid bifurcation with mild less 50% proximal ICA stenosis. Left carotid system: No evidence of dissection, stenosis (50% or greater) or occlusion. Moderate calcified plaque of the left common carotid artery and the carotid bifurcation with minimal less than 30% stenosis. Vertebral arteries: Right dominant vertebral artery system. Widely patent right vertebral artery. 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 The vertebral artery downstream to the C4 level is diminutive, but patent. At the C2 level the patent lumen is thread-like (series 8, image 110) and the vessel itself is mildly widened suggesting dissection. Skeleton: Moderate cervical spondylosis greatest at the C4-C6 levels. No high-grade bony canal stenosis. Other neck: 11 mm calcified nodule within the left thyroid gland. Upper chest: Negative. Review of the MIP images confirms the above findings CTA HEAD FINDINGS Anterior circulation: No significant stenosis, proximal occlusion, aneurysm, or vascular malformation. Calcified plaque of the carotid siphons with mild left distal cavernous stenosis. Posterior circulation: No significant stenosis, proximal occlusion, aneurysm, or vascular malformation. Venous sinuses: As permitted by contrast timing, patent. Anatomic variants: Complete circle-of-Willis. Review of the MIP images confirms the above findings IMPRESSION: CT head: 1. Stable small age-indeterminate  infarctions within the right putamen and right occipital cortex. 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 2. No new stroke, hemorrhage, or mass effect identified. CTA neck: 1. 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 Patent vessel downstream to the C4 level. 2. Thread-like severe stenosis of left vertebral artery at the C2 level with mild widening of the vessel may represent dissection. 3. Patent bilateral carotid systems and right vertebral artery. No dissection, aneurysm, or significant stenosis by NASCET criteria. CTA head: 1. No large vessel occlusion, aneurysm, or significant stenosis is identified. 2. 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 These results were called by telephone at the time of interpretation on 10/05/2017 at 4:23 pm to Dr. Ritta Slot, who verbally acknowledged these results. Electronically Signed   By: Micah Noel  Furusawa-Stratton M.D.   On: 10/05/2017 16:24   Dg Chest 1 View  Result Date: 10/05/2017 CLINICAL DATA:  [place], unresponsive, unable to cooperate, held for film EXAM: CHEST  1 VIEW COMPARISON:  none FINDINGS: Lungs are clear. Heart size and mediastinal contours are within normal limits. Aortic Atherosclerosis (ICD10-170.0). No effusion. Visualized bones unremarkable. IMPRESSION: No acute cardiopulmonary disease. Electronically Signed   By: Corlis Leak M.D.   On: 10/05/2017 08:29   Ct Head Wo Contrast  Result Date: 10/05/2017 CLINICAL DATA:  walking and collapsed stiff with possible seizure activity. Pt had seizure activity en route to hospital with ems and was incontinent of urine. Given Versed 2.5mg  for this. Pt is neglecting right side of body with no verbal response. EXAM: CT HEAD WITHOUT CONTRAST TECHNIQUE: Contiguous axial images were obtained from the base of the skull through the vertex without intravenous contrast. Technologist notes Multiple attempts to scan patient with  multiple restraints tried before diagnostic exam achieved./ COMPARISON:  None. FINDINGS: Brain: Mild atrophy. There is no evidence of acute intracranial hemorrhage, brain edema, mass lesion, acute infarction, mass effect, or midline shift. Acute infarct may be inapparent on noncontrast CT. No other intra-axial abnormalities are seen, and the ventricles and sulci are within normal limits in size and symmetry. No abnormal extra-axial fluid collections or masses are identified. Some continued motion degradation. Vascular: No hyperdense vessel or unexpected calcification. Skull: Normal. Negative for fracture or focal lesion. Sinuses/Orbits: Hypoplastic frontal sinuses.  Otherwise negative. Other: None. IMPRESSION: Atrophy.  No bleed or other acute intracranial process. Electronically Signed   By: Corlis Leak M.D.   On: 10/05/2017 08:29   Ct Angio Neck W Or Wo Contrast  Result Date: 10/05/2017 CLINICAL DATA:  Code stroke. Evaluate posterior circulation, speech difficulty. EXAM: CT HEAD WITHOUT CONTRAST CT ANGIOGRAPHY HEAD AND NECK TECHNIQUE: Multidetector CT imaging of the head and neck was performed using the standard protocol during bolus administration of intravenous contrast. Multiplanar CT image reconstructions and MIPs were obtained to evaluate the vascular anatomy. Carotid stenosis measurements (when applicable) are obtained utilizing NASCET criteria, using the distal internal carotid diameter as the denominator. CONTRAST:  50mL ISOVUE-370 IOPAMIDOL (ISOVUE-370) INJECTION 76% COMPARISON:  10/05/2017 CT head. 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 Right occipital lobe small cortical lucency without volume loss, possibly acute or early subacute infarction. Stable age indeterminate lacunar infarction within the right lentiform nucleus. No hemorrhage, mass effect, extra-axial collection, hydrocephalus, or herniation. Vascular: As below.  Skull: Normal. Negative for fracture or focal lesion. Sinuses: Imaged portions are clear. Orbits: No acute finding. Review of the MIP images confirms the above findings CTA NECK FINDINGS Aortic arch: Standard branching. Imaged portion shows no evidence of aneurysm or dissection. 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 Right carotid system: No evidence of dissection, stenosis (50% or greater) or occlusion. Moderate calcified plaque of the carotid bifurcation with mild less 50% proximal ICA stenosis. Left carotid system: No evidence of dissection, stenosis (50% or greater) or occlusion. Moderate calcified plaque of the left common carotid artery and the carotid bifurcation with minimal less than 30% stenosis. Vertebral arteries: Right dominant vertebral artery system. Widely patent right vertebral artery. 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 The vertebral artery downstream to the C4 level is diminutive, but patent. At the C2 level the patent lumen is thread-like (series 8, image 110) and the vessel itself is mildly  widened suggesting dissection. Skeleton: Moderate cervical spondylosis greatest at the C4-C6 levels. No high-grade bony canal stenosis. Other neck: 11 mm calcified nodule within the left thyroid gland. Upper chest: Negative. Review of the MIP images confirms the above findings CTA HEAD FINDINGS Anterior circulation: No significant stenosis, proximal occlusion, aneurysm, or vascular malformation. Calcified plaque of the carotid siphons with mild left distal cavernous stenosis. Posterior circulation: No significant stenosis, proximal occlusion, aneurysm, or vascular malformation. Venous sinuses: As permitted by contrast timing, patent. Anatomic variants: Complete circle-of-Willis. Review of the MIP images confirms the above findings IMPRESSION: CT head: 1. Stable small age-indeterminate infarctions within the right putamen and right  occipital cortex. 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 2. No new stroke, hemorrhage, or mass effect identified. CTA neck: 1. 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 Patent vessel downstream to the C4 level. 2. Thread-like severe stenosis of left vertebral artery at the C2 level with mild widening of the vessel may represent dissection. 3. Patent bilateral carotid systems and right vertebral artery. No dissection, aneurysm, or significant stenosis by NASCET criteria. CTA head: 1. No large vessel occlusion, aneurysm, or significant stenosis is identified. 2. 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 These results were called by telephone at the time of interpretation on 10/05/2017 at 4:23 pm to Dr. Ritta Slot, who verbally acknowledged these results. Electronically Signed   By: Mitzi Hansen M.D.   On: 10/05/2017 16:24   Mr Brain Wo Contrast  Result Date: 10/07/2017 CLINICAL DATA:  57 year old female with altered mental status and seizure. Age indeterminate small infarcts suspected in the right putamen and right occipital lobe on recent head CT. Abnormal left vertebral artery on recent CTA. EXAM: MRI HEAD WITHOUT CONTRAST TECHNIQUE: Multiplanar, multiecho pulse sequences of the brain and surrounding structures were obtained without intravenous contrast. COMPARISON:  Head CT and CTA head and neck 10/05/2017 FINDINGS: The examination had to be discontinued prior to completion due to patient agitation, inability to cooperate. Good quality axial diffusion weighted imaging was obtained. No restricted diffusion or acute infarct. There is facilitated diffusion in the right occipital lobe on series 6, image 16 corresponding to the recent CT hypodensity compatible with chronic encephalomalacia. There is likewise mild asymmetric facilitated diffusion in the right putamen. No other abnormal signal identified on  diffusion-weighted imaging; no abnormal diffusion in the hippocampal formations. Only severely motion degraded axial T2 weighted imaging was obtained in addition to the diffusion. No intracranial mass effect or ventriculomegaly. IMPRESSION: 1. Good quality axial diffusion weighted imaging without evidence of acute infarct. Evidence of chronic encephalomalacia corresponding to the right putamen and right occipital findings on CT. 2. Only motion degraded T2 weighted imaging was otherwise obtained before the study had to be discontinued prior to completion due to patient agitation. Electronically Signed   By: Odessa Fleming M.D.   On: 10/07/2017 11:16   Ct Head Code Stroke Wo Contrast  Result Date: 10/05/2017 CLINICAL DATA:  Code stroke. Evaluate posterior circulation, speech difficulty. EXAM: CT HEAD WITHOUT CONTRAST CT ANGIOGRAPHY HEAD AND NECK TECHNIQUE: Multidetector CT imaging of the head and neck was performed using the standard protocol during bolus administration of intravenous contrast. Multiplanar CT image reconstructions and MIPs were obtained to evaluate the vascular anatomy. Carotid stenosis measurements (when applicable) are obtained utilizing NASCET criteria, using the distal internal carotid diameter as the denominator. CONTRAST:  50mL ISOVUE-370 IOPAMIDOL (ISOVUE-370) INJECTION 76% COMPARISON:  10/05/2017 CT head. 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 Right occipital lobe small cortical lucency without volume loss, possibly acute or early subacute infarction. Stable age indeterminate lacunar infarction within the right lentiform nucleus. No hemorrhage, mass effect, extra-axial collection, hydrocephalus, or herniation. Vascular: As below. Skull: Normal. Negative for fracture or focal lesion. Sinuses: Imaged portions are clear. Orbits: No acute finding. Review of the MIP images confirms the above findings CTA NECK FINDINGS Aortic  arch: Standard branching. Imaged portion shows no evidence of aneurysm or dissection. 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 Right carotid system: No evidence of dissection, stenosis (50% or greater) or occlusion. Moderate calcified plaque of the carotid bifurcation with mild less 50% proximal ICA stenosis. Left carotid system: No evidence of dissection, stenosis (50% or greater) or occlusion. Moderate calcified plaque of the left common carotid artery and the carotid bifurcation with minimal less than 30% stenosis. Vertebral arteries: Right dominant vertebral artery system. Widely patent right vertebral artery. 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 The vertebral artery downstream to the C4 level is diminutive, but patent. At the C2 level the patent lumen is thread-like (series 8, image 110) and the vessel itself is mildly widened suggesting dissection. Skeleton: Moderate cervical spondylosis greatest at the C4-C6 levels. No high-grade bony canal stenosis. Other neck: 11 mm calcified nodule within the left thyroid gland. Upper chest: Negative. Review of the MIP images confirms the above findings CTA HEAD FINDINGS Anterior circulation: No significant stenosis, proximal occlusion, aneurysm, or vascular malformation. Calcified plaque of the carotid siphons with mild left distal cavernous stenosis. Posterior circulation: No significant stenosis, proximal occlusion, aneurysm, or vascular malformation. Venous sinuses: As permitted by contrast timing, patent. Anatomic variants: Complete circle-of-Willis. Review of the MIP images confirms the above findings IMPRESSION: CT head: 1. 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 2. No new stroke, hemorrhage, or mass effect identified. CTA neck: 1. Left vertebral artery occlusion from  origin to C4 level. Patent vessel downstream to the C4 level. 2. Thread-like severe stenosis of left vertebral artery at the C2 level with mild widening of the vessel may represent dissection. 3. Patent bilateral carotid systems and right vertebral artery. No dissection, aneurysm, or significant stenosis by NASCET criteria. CTA head: 1. No large vessel occlusion, aneurysm, or significant stenosis is identified. 2. 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 These results were called by telephone at the time of interpretation on 10/05/2017 at 4:23 pm to Dr. Ritta SlotMCNEILL KIRKPATRICK, who verbally acknowledged these results. Electronically Signed   By: Mitzi HansenLance  Furusawa-Stratton M.D.   On: 10/05/2017 16:24     LOS: 3 days   Jeoffrey MassedShanker Essa Wenk, MD  [facility]  If 7PM-7AM, please contact night-coverage  Please page via [email or web address]-Password [email or web address]-click on MD name and type text message  10/08/2017, 3:09 PM

## 2017-10-08 NOTE — Progress Notes (Signed)
  Speech Language Pathology Treatment: Dysphagia  Patient Details Name: Betty Nelson MRN: [id number] DOB: 04/14/1960 Today's Date: 10/08/2017 Time: 2257-5051 SLP Time Calculation (min) (ACUTE ONLY): 23 min  Assessment / Plan / Recommendation Clinical Impression  Pt alert today, follows commands, interacts with examiner, asks questions and answers orientation questions.  Today, pt able to attend to and masticate solids, swallows briskly, and demonstrates no concerns for aspiration.  She does need assist with self-feeding.  Recommend resuming a soft diet, thin liquids.  No further SLP f/u is warranted.  Our service will sign off.   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 CXR clear, CT Head without acute findings, MRI negative.      SLP Plan  All goals met       Recommendations  Diet recommendations: Dysphagia 3 (mechanical soft);Thin liquid Liquids provided via: Cup;Straw Medication Administration: Whole meds with liquid Supervision: Staff to assist with self feeding                Oral Care Recommendations: Oral care BID Follow up Recommendations: None SLP Visit Diagnosis: Dysphagia, unspecified (R13.10) Plan: All goals met       GO                Juan Quam Laurice 10/08/2017, 9:53 AM

## 2017-10-08 NOTE — Progress Notes (Addendum)
Subjective: Patient is doing much better today, she is more alert, she is able to tell me that she is in the hospital/month is July/in the years 2019.  She is able to follow commands.  She is no longer perseverating.  Exam: Vitals:   10/08/17 0400 10/08/17 0803  BP: (!) 186/98 (!) 161/100  Pulse: 88 (!) 101  Resp: 16 14  Temp: 97.9 F (36.6 C) 97.9 F (36.6 C)  SpO2:  93%    Physical Exam  HEENT-  Normocephalic, no lesions, without obvious abnormality.  Normal external eye and conjunctiva.   Cardiovascular- S1-S2 audible, pulses palpable throughout   Extremities- Warm, dry and intact Musculoskeletal-no joint tenderness, deformity or swelling Skin-warm and dry, no hyperpigmentation, vitiligo, or suspicious lesions  Neuro:  Mental Status: Alert, oriented to hospital, year, month and self.  Speech fluent without evidence of aphasia.  Able to follow 3 step commands without difficulty. Cranial Nerves: II:  Visual fields grossly normal  III,IV, VI: ptosis not present, extra-ocular motions intact bilaterally pupils equal, round, reactive to light and accommodation V,VII: smile symmetric, facial light touch sensation normal bilaterally VIII: hearing normal bilaterally IX,X: uvula rises symmetrically XI: bilateral shoulder shrug XII: midline tongue extension Motor: Moving all extremities throughout with 4/5 strength.  However she is still in two-point restraints. Sensory: Pinprick and light touch intact throughout, bilaterally Deep Tendon Reflexes: 2+ and symmetric throughout     Medications:  Scheduled: . folic acid  1 mg Intravenous Daily  . heparin  5,000 Units Subcutaneous Q8H  . loratadine  10 mg Oral Daily  . mouth rinse  15 mL Mouth Rinse BID  . metoprolol tartrate  5 mg Intravenous Q8H  . nicotine  21 mg Transdermal Daily  . sodium chloride flush  3 mL Intravenous Q12H    Pertinent Labs/Diagnostics:   Mr Brain Wo Contrast  Result Date: 10/07/2017 CLINICAL DATA:   57 year old female with altered mental status and seizure. Age indeterminate small infarcts suspected in the right putamen and right occipital lobe on recent head CT. Abnormal left vertebral artery on recent CTA. EXAM: MRI HEAD WITHOUT CONTRAST TECHNIQUE: Multiplanar, multiecho pulse sequences of the brain and surrounding structures were obtained without intravenous contrast. COMPARISON:  Head CT and CTA head and neck 10/05/2017 FINDINGS: The examination had to be discontinued prior to completion due to patient agitation, inability to cooperate. Good quality axial diffusion weighted imaging was obtained. No restricted diffusion or acute infarct. There is facilitated diffusion in the right occipital lobe on series 6, image 16 corresponding to the recent CT hypodensity compatible with chronic encephalomalacia. There is likewise mild asymmetric facilitated diffusion in the right putamen. No other abnormal signal identified on diffusion-weighted imaging; no abnormal diffusion in the hippocampal formations. Only severely motion degraded axial T2 weighted imaging was obtained in addition to the diffusion. No intracranial mass effect or ventriculomegaly. IMPRESSION: 1. Good quality axial diffusion weighted imaging without evidence of acute infarct. Evidence of chronic encephalomalacia corresponding to the right putamen and right occipital findings on CT. 2. Only motion degraded T2 weighted imaging was otherwise obtained before the study had to be discontinued prior to completion due to patient agitation. Electronically Signed   By: Odessa FlemingH  Hall M.D.   On: 10/07/2017 11:16    Felicie MornDavid Smith PA-C [facility] [phone]  Assessment: 57 year old female with baseline alcohol abuse, presenting with confusion after stopping alcohol.  Patient also was taking 2 Xanax a day and stopped taking her Xanax at the same time. 1.  At this point patient has improved significantly.   2. MRI shows evidence of chronic  encephalomalacia corresponding to the right putamen and right occipital findings on CT, with no acute infarcts noted.   3. Patient's encephalopathy is felt most likely to be secondary to benzodiazepine/alcohol withdrawal.  Recommendations: - Continue CIWA protocol - Neurology will sign off. Please call if there are additional questions.   Electronically signed: Dr. Caryl Pina 10/08/2017, 9:40 AM

## 2017-10-08 NOTE — Care Management Important Message (Signed)
Important Message  Patient Details  Name: Betty Nelson MRN: [id number] Date of Birth: September 18, 1960   Medicare Important Message Given:  Yes    Story Conti 10/08/2017, 2:52 PM

## 2017-10-09 ENCOUNTER — Encounter (HOSPITAL_COMMUNITY): Payer: Self-pay | Admitting: General Practice

## 2017-10-09 ENCOUNTER — Other Ambulatory Visit: Payer: Self-pay

## 2017-10-09 LAB — BASIC METABOLIC PANEL
Anion gap: 4 — ABNORMAL LOW (ref 5–15)
CHLORIDE: 109 mmol/L (ref 98–111)
CO2: 21 mmol/L — ABNORMAL LOW (ref 22–32)
Calcium: 7.7 mg/dL — ABNORMAL LOW (ref 8.9–10.3)
Creatinine, Ser: 0.7 mg/dL (ref 0.44–1.00)
GFR calc Af Amer: >60 mL/min (ref >60)
GLUCOSE: 96 mg/dL (ref 70–99)
POTASSIUM: 3 mmol/L — AB (ref 3.5–5.1)
Sodium: 134 mmol/L — ABNORMAL LOW (ref 135–145)

## 2017-10-09 MED ORDER — POTASSIUM CHLORIDE CRYS ER 20 MEQ PO TBCR
40.0000 meq | EXTENDED_RELEASE_TABLET | ORAL | Status: AC
  Administered 2017-10-09 (×2): 40 meq via ORAL
  Filled 2017-10-09 (×2): qty 2

## 2017-10-09 MED ORDER — ENSURE ENLIVE PO LIQD
237.0000 mL | Freq: Two times a day (BID) | ORAL | Status: DC
  Administered 2017-10-10: 237 mL via ORAL

## 2017-10-09 NOTE — Evaluation (Signed)
Physical Therapy Evaluation Patient Details Name: Betty Nelson MRN: [id number] DOB: 24-Aug-1960 Today's Date: 10/09/2017   History of Present Illness  pt is a 57 y/o F w/ a PMH of HTN, Abdominal Aortic Aneurysm, depression, alcohol and substance abuse, and presents currently post-seizure w/ Todds Paralysis.  Clinical Impression  Pt ambulated w/ MinA and a RW, but presents with cognitive deficits and required constant cueing for safety and mobility. Pt presents w/ decreased coordination, particularly on L side, and decreased ability to follow on-step commands that may be a result of seizures. This lack of coordination may impact ADLs, thus we recommend a consult with OT. Will continue to follow acutely.     Follow Up Recommendations Home health PT;Supervision/Assistance - 24 hour    Equipment Recommendations  None recommended by PT    Recommendations for Other Services OT consult     Precautions / Restrictions Precautions Precautions: (Seizure) Restrictions Weight Bearing Restrictions: No      Mobility  Bed Mobility Overal bed mobility: Needs Assistance Bed Mobility: Supine to Sit     Supine to sit: Min assist     General bed mobility comments: Pt complained of pain during transitions. Pt seemed guarded and fearful fo pain, which was limiting. After moving, she reported "that wasn't so bad."  Transfers Overall transfer level: Needs assistance Equipment used: Rolling walker (2 wheeled) Transfers: Sit to/from Stand Sit to Stand: Min assist         General transfer comment: Pt was slow to initiate sit<>stand and placed hands incorrectly on RW. Would recommend +2 assists for safety in the future.   Ambulation/Gait Ambulation/Gait assistance: Min guard;Min assist Gait Distance (Feet): 65 Feet Assistive device: Rolling walker (2 wheeled) Gait Pattern/deviations: Step-through pattern;Decreased step length - right;Decreased step length - left;Decreased dorsiflexion -  right;Decreased dorsiflexion - left;Drifts right/left     General Gait Details: pt regularly drifted from side to side, and required cues to avoid running the RW into obstacles (door jam, carts, bed). Pt required cues to look up, and did not lift her feet well to clear the ground consistently.  Stairs            Wheelchair Mobility    Modified Rankin (Stroke Patients Only)       Balance Overall balance assessment: Mild deficits observed, not formally tested                                           Pertinent Vitals/Pain Pain Assessment: Faces Faces Pain Scale: Hurts a little bit Pain Location: Back Pain Descriptors / Indicators: Aching;Grimacing;Guarding Pain Intervention(s): Limited activity within patient's tolerance;Monitored during session;Patient requesting pain meds-RN notified;RN gave pain meds during session    Home Living Family/patient expects to be discharged to:: Private residence Living Arrangements: Spouse/significant other Available Help at Discharge: Family Type of Home: House Home Access: Stairs to enter Entrance Stairs-Rails: Can reach both Entrance Stairs-Number of Steps: 2 Home Layout: One level Home Equipment: Walker - 2 wheels Additional Comments: Pt reports distance from parking to home is 30 ft    Prior Function Level of Independence: Independent with assistive device(s)         Comments: Pt report inconsistent; she needed the walker to get around, "depending on what (she) was doing," but also reports not using it.     Hand Dominance   Dominant Hand: Right  Extremity/Trunk Assessment   Upper Extremity Assessment Upper Extremity Assessment: LUE deficits/detail LUE Deficits / Details: Limited ROM on L UE in over-head reach, and reduced coordination (nose-to-finger test) on L UE side compared to R UE LUE Coordination: decreased fine motor    Lower Extremity Assessment Lower Extremity Assessment: LLE  deficits/detail LLE Deficits / Details: Decreased ability to follow directions, larger motions, decreased strength in SLR and marches LLE Coordination: decreased fine motor    Cervical / Trunk Assessment Cervical / Trunk Assessment: Normal(Excellent posture when using RW)  Communication   Communication: No difficulties  Cognition Arousal/Alertness: Awake/alert Behavior During Therapy: Flat affect Overall Cognitive Status: Impaired/Different from baseline Area of Impairment: Attention;Memory;Following commands;Safety/judgement;Awareness;Problem solving                   Current Attention Level: Sustained Memory: Decreased recall of precautions;Decreased short-term memory Following Commands: Follows one step commands inconsistently Safety/Judgement: Decreased awareness of safety;Decreased awareness of deficits Awareness: Intellectual Problem Solving: Slow processing;Decreased initiation;Difficulty sequencing;Requires verbal cues;Requires tactile cues General Comments: Pt was easily distracted, and did not follow safety cues once her attention was directed elsewhere. Pt was able to answer questions if asked directly (and repeatedly), but answers were not always consistent.      General Comments General comments (skin integrity, edema, etc.): Pt requested pain meds upon entrance to the room. Pt reported back pain, and was able to ambulate after medication.    Exercises General Exercises - Lower Extremity Ankle Circles/Pumps: Both;AROM;AAROM;10 reps;Supine(Pt required tactile cues to proceed through full ROM) Heel Slides: 10 reps;Both;Supine Straight Leg Raises: 10 reps;Both;AROM;Supine Hip Flexion/Marching: AROM;5 reps;Both;Seated   Assessment/Plan    PT Assessment Patient needs continued PT services  PT Problem List Decreased strength;Decreased range of motion;Decreased balance;Decreased mobility;Decreased coordination;Decreased cognition;Decreased knowledge of use of  DME;Decreased safety awareness;Decreased knowledge of precautions;Pain       PT Treatment Interventions Gait training;Stair training;DME instruction;Therapeutic activities;Therapeutic exercise;Balance training;Neuromuscular re-education;Cognitive remediation;Patient/family education;Functional mobility training    PT Goals (Current goals can be found in the Care Plan section)  Acute Rehab PT Goals Patient Stated Goal: To go home PT Goal Formulation: With patient Time For Goal Achievement: 10/23/17 Potential to Achieve Goals: Fair    Frequency Min 3X/week   Barriers to discharge        Co-evaluation               AM-PAC PT "6 Clicks" Daily Activity  Outcome Measure Difficulty turning over in bed (including adjusting bedclothes, sheets and blankets)?: Unable Difficulty moving from lying on back to sitting on the side of the bed? : Unable Difficulty sitting down on and standing up from a chair with arms (e.g., wheelchair, bedside commode, etc,.)?: Unable Help needed moving to and from a bed to chair (including a wheelchair)?: A Little Help needed walking in hospital room?: A Little Help needed climbing 3-5 steps with a railing? : A Lot 6 Click Score: 11    End of Session Equipment Utilized During Treatment: Gait belt Activity Tolerance: Patient tolerated treatment well Patient left: in chair;with call bell/phone within reach;with nursing/sitter in room   PT Visit Diagnosis: Unsteadiness on feet (R26.81);Other abnormalities of gait and mobility (R26.89);Muscle weakness (generalized) (M62.81);Difficulty in walking, not elsewhere classified (R26.2);Pain    Time: [id number] PT Time Calculation (min) (ACUTE ONLY): 42 min   Charges:   PT Evaluation $PT Eval Moderate Complexity: 1 Mod PT Treatments $Gait Training: 8-22 mins $Therapeutic Exercise: 8-22 mins   PT G Codes:  Demetria Pore, S-DPT Acute Care Rehab Student [phone]  10/09/2017, 10:59 AM

## 2017-10-09 NOTE — Progress Notes (Signed)
PROGRESS NOTE        PATIENT DETAILS Name: Betty Nelson Age: 57 y.o. Sex: female Date of Birth: Feb 27, 1961 Admit Date: 10/05/2017 Admitting Physician No admitting provider for patient encounter. XBJ:YNWGN, Wayland Salinas, PA-C  Brief Narrative: Patient is a 57 y.o. female with long-standing history of alcohol use, depression, chronic benzodiazepine use any pain hospital after she was found down in the home from apparent seizures.  Subsequently transferred to [facility] for further evaluation.  Hospital course complicated by apathy that is slowly improving with supportive care.  Currently thought to have alcohol withdrawal/benzodiazepine withdrawal seizures.  Subjective: Appears weak but much more awake and alert compared to the past few days.  Assessment/Plan: Seizures: secondary to alcohol and benzodiazepine withdrawal (stopped taking Xanax a few days prior to admission.  Neurology-Dr. Otelia Limes over the phone on 7/3-stop Keppra-continue with as needed benzodiazepine.    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  MRI brain negative for CVA.  Continue evaluation by physical therapy services.  Probably will require home health services on discharge.    Acute metabolic encephalopathy: Significantly improved-this is in the setting of alcohol withdrawal, postictal state from seizures.  MRI brain without any acute abnormalities.  EEG negative for seizures.   Alcohol abuse:Continue with Ativan per CIWA protocol.  Counseled-not sure if she has any intention of quitting at this time.  Hypokalemia: Replete and recheck  Mild hyponatremia: Doubt further work-up required.  Moderate malnutrition: Continue supplements  DVT Prophylaxis: Prophylactic Heparin  Code Status: Full code   Family Communication: Spouse over the phone  Disposition Plan: Remain inpatient-home with home health services on 7/5  Antimicrobial agents: Anti-infectives (From  admission, onward)   None      Procedures: None  CONSULTS:  neurology  Time spent: 25- minutes-Greater than 50% of this time was spent in counseling, explanation of diagnosis, planning of further management, and coordination of care.  MEDICATIONS: Scheduled Meds: . folic acid  1 mg Oral Daily  . heparin  5,000 Units Subcutaneous Q8H  . loratadine  10 mg Oral Daily  . mouth rinse  15 mL Mouth Rinse BID  . metoprolol tartrate  25 mg Oral BID  . nicotine  21 mg Transdermal Daily  . sodium chloride flush  3 mL Intravenous Q12H   Continuous Infusions: . dextrose 5 % and 0.45 % NaCl with KCl 20 mEq/L 50 mL/hr (10/08/17 1649)   PRN Meds:.acetaminophen, hydrALAZINE, LORazepam, ondansetron **OR** ondansetron (ZOFRAN) IV   PHYSICAL EXAM: Vital signs: Vitals:   10/09/17 0404 10/09/17 0432 10/09/17 0752 10/09/17 1142  BP: (!) 145/81 127/71 133/78 (!) 162/80  Pulse: 76  89 79  Resp: 16  18 16   Temp: 99.1 F (37.3 C)  98.9 F (37.2 C)   TempSrc: Oral  Oral   SpO2: 100%  99% 100%  Weight:      Height:       Filed Weights   10/05/17 0739  Weight: 45.4 kg (100 lb)   Body mass index is 17.71 kg/m.   General appearance:Awake, alert, not in any distress.  Eyes:no scleral icterus. HEENT: Atraumatic and Normocephalic Neck: supple, no JVD. Resp:Good air entry bilaterally,no rales or rhonchi CVS: S1 S2 regular, no murmurs.  GI: Bowel sounds present, Non tender and not distended with no gaurding, rigidity or rebound. Extremities: B/L Lower Ext shows no edema, both  legs are warm to touch Neurology:  Non focal Psychiatric: Normal judgment and insight. Normal mood. Musculoskeletal:No digital cyanosis Skin:No Rash, warm and dry Wounds:N/A  I have personally reviewed following labs and imaging studies  LABORATORY DATA: CBC: Recent Labs  Lab 10/05/17 0903 10/05/17 1350 10/06/17 0655 10/07/17 0554 10/08/17 0424  WBC 9.0 7.7 6.2 5.4 5.7  NEUTROABS 7.7  --   --   --   --     HGB 15.1* 12.3 13.3 11.6* 11.1*  HCT 43.4 34.2* 37.2 33.9* 31.9*  MCV 94.8 91.2 90.1 93.6 92.2  PLT 172 162 177 177 194    Basic Metabolic Panel: Recent Labs  Lab 10/05/17 0903 10/05/17 1350 10/06/17 0655 10/07/17 0554 10/08/17 0424 10/09/17 0739  NA 131*  --  134* 135 133* 134*  K 4.0  --  3.0* 2.9* 3.1* 3.0*  CL 95*  --  99 105 103 109  CO2 19*  --  19* 20* 22 21*  GLUCOSE 162*  --  97 79 96 96  BUN 7  --  5* <5* <5* <5*  CREATININE 0.74 0.72 0.74 0.76 0.65 0.70  CALCIUM 9.1  --  8.0* 7.9* 7.8* 7.7*  MG  --  1.3*  --  1.9 1.8  --   PHOS  --  3.4  --  2.8 2.6  --     GFR: Estimated Creatinine Clearance: 54.3 mL/min (by C-G formula based on SCr of 0.7 mg/dL).  Liver Function Tests: Recent Labs  Lab 10/05/17 0903 10/07/17 0554  AST 58* 64*  ALT 28 31  ALKPHOS 63 40  BILITOT 1.2 1.3*  PROT 8.8* 6.7  ALBUMIN 4.6 3.6   No results for input(s): LIPASE, AMYLASE in the last 168 hours. Recent Labs  Lab 10/05/17 1739  AMMONIA 52*    Coagulation Profile: No results for input(s): INR, PROTIME in the last 168 hours.  Cardiac Enzymes: No results for input(s): CKTOTAL, CKMB, CKMBINDEX, TROPONINI in the last 168 hours.  BNP (last 3 results) No results for input(s): PROBNP in the last 8760 hours.  HbA1C: No results for input(s): HGBA1C in the last 72 hours.  CBG: Recent Labs  Lab 10/05/17 0743  GLUCAP 175*    Lipid Profile: No results for input(s): CHOL, HDL, LDLCALC, TRIG, CHOLHDL, LDLDIRECT in the last 72 hours.  Thyroid Function Tests: No results for input(s): TSH, T4TOTAL, FREET4, T3FREE, THYROIDAB in the last 72 hours.  Anemia Panel: No results for input(s): VITAMINB12, FOLATE, FERRITIN, TIBC, IRON, RETICCTPCT in the last 72 hours.  Urine analysis:    Component Value Date/Time   COLORURINE YELLOW 10/05/2017 0819   APPEARANCEUR CLEAR 10/05/2017 0819   LABSPEC 1.012 10/05/2017 0819   PHURINE 6.0 10/05/2017 0819   GLUCOSEU 50 (A) 10/05/2017 0819    HGBUR SMALL (A) 10/05/2017 0819   BILIRUBINUR NEGATIVE 10/05/2017 0819   KETONESUR NEGATIVE 10/05/2017 0819   PROTEINUR >=300 (A) 10/05/2017 0819   NITRITE NEGATIVE 10/05/2017 0819   LEUKOCYTESUR NEGATIVE 10/05/2017 0819    Sepsis Labs: Lactic Acid, Venous    Component Value Date/Time   LATICACIDVEN 3.1 (HH) 10/05/2017 1739    MICROBIOLOGY: Recent Results (from the past 240 hour(s))  CSF culture     Status: None   Collection Time: 10/05/17  5:15 PM  Result Value Ref Range Status   Specimen Description CSF TUBE 2  Final   Special Requests NONE  Final   Gram Stain   Final    WBC PRESENT,BOTH PMN AND MONONUCLEAR  NO ORGANISMS SEEN CYTOSPIN SMEAR    Culture   Final    NO GROWTH 3 DAYS Performed at [facility], [address] [place], [place] [address]    Report Status 10/08/2017 FINAL  Final    RADIOLOGY STUDIES/RESULTS: Ct Angio Head W Or Wo Contrast  Result Date: 10/05/2017 CLINICAL DATA:  Code stroke. Evaluate posterior circulation, speech difficulty. EXAM: CT HEAD WITHOUT CONTRAST CT ANGIOGRAPHY HEAD AND NECK TECHNIQUE: Multidetector CT imaging of the head and neck was performed using the standard protocol during bolus administration of intravenous contrast. Multiplanar CT image reconstructions and MIPs were obtained to evaluate the vascular anatomy. Carotid stenosis measurements (when applicable) are obtained utilizing NASCET criteria, using the distal internal carotid diameter as the denominator. CONTRAST:  50mL ISOVUE-370 IOPAMIDOL (ISOVUE-370) INJECTION 76% COMPARISON:  10/05/2017 CT head. 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 Right occipital lobe small cortical lucency without volume loss, possibly acute or early subacute infarction. Stable age indeterminate lacunar infarction within the right lentiform nucleus. No hemorrhage, mass effect, extra-axial collection, hydrocephalus, or  herniation. Vascular: As below. Skull: Normal. Negative for fracture or focal lesion. Sinuses: Imaged portions are clear. Orbits: No acute finding. Review of the MIP images confirms the above findings CTA NECK FINDINGS Aortic arch: Standard branching. Imaged portion shows no evidence of aneurysm or dissection. 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 Right carotid system: No evidence of dissection, stenosis (50% or greater) or occlusion. Moderate calcified plaque of the carotid bifurcation with mild less 50% proximal ICA stenosis. Left carotid system: No evidence of dissection, stenosis (50% or greater) or occlusion. Moderate calcified plaque of the left common carotid artery and the carotid bifurcation with minimal less than 30% stenosis. Vertebral arteries: Right dominant vertebral artery system. Widely patent right vertebral artery. 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 The vertebral artery downstream to the C4 level is diminutive, but patent. At the C2 level the patent lumen is thread-like (series 8, image 110) and the vessel itself is mildly widened suggesting dissection. Skeleton: Moderate cervical spondylosis greatest at the C4-C6 levels. No high-grade bony canal stenosis. Other neck: 11 mm calcified nodule within the left thyroid gland. Upper chest: Negative. Review of the MIP images confirms the above findings CTA HEAD FINDINGS Anterior circulation: No significant stenosis, proximal occlusion, aneurysm, or vascular malformation. Calcified plaque of the carotid siphons with mild left distal cavernous stenosis. Posterior circulation: No significant stenosis, proximal occlusion, aneurysm, or vascular malformation. Venous sinuses: As permitted by contrast timing, patent. Anatomic variants: Complete circle-of-Willis. Review of the MIP images confirms the above findings IMPRESSION: CT head: 1. Stable small age-indeterminate infarctions within  the right putamen and right occipital cortex. 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 2. No new stroke, hemorrhage, or mass effect identified. CTA neck: 1. 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 Patent vessel downstream to the C4 level. 2. Thread-like severe stenosis of left vertebral artery at the C2 level with mild widening of the vessel may represent dissection. 3. Patent bilateral carotid systems and right vertebral artery. No dissection, aneurysm, or significant stenosis by NASCET criteria. CTA head: 1. No large vessel occlusion, aneurysm, or significant stenosis is identified. 2. 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 These results were called by telephone at the time of interpretation on 10/05/2017 at 4:23 pm to Dr. Ritta Slot,  who verbally acknowledged these results. Electronically Signed   By: Mitzi HansenLance  Furusawa-Stratton M.D.   On: 10/05/2017 16:24   Dg Chest 1 View  Result Date: 10/05/2017 CLINICAL DATA:  Siezure, unresponsive, unable to cooperate, held for film EXAM: CHEST  1 VIEW COMPARISON:  none FINDINGS: Lungs are clear. Heart size and mediastinal contours are within normal limits. Aortic Atherosclerosis (ICD10-170.0). No effusion. Visualized bones unremarkable. IMPRESSION: No acute cardiopulmonary disease. Electronically Signed   By: Corlis Leak  Hassell M.D.   On: 10/05/2017 08:29   Ct Head Wo Contrast  Result Date: 10/05/2017 CLINICAL DATA:  walking and collapsed stiff with possible seizure activity. Pt had seizure activity en route to hospital with ems and was incontinent of urine. Given Versed 2.5mg  for this. Pt is neglecting right side of body with no verbal response. EXAM: CT HEAD WITHOUT CONTRAST TECHNIQUE: Contiguous axial images were obtained from the base of the skull through the vertex without intravenous contrast. Technologist notes Multiple attempts to scan patient with multiple restraints  tried before diagnostic exam achieved./ COMPARISON:  None. FINDINGS: Brain: Mild atrophy. There is no evidence of acute intracranial hemorrhage, brain edema, mass lesion, acute infarction, mass effect, or midline shift. Acute infarct may be inapparent on noncontrast CT. No other intra-axial abnormalities are seen, and the ventricles and sulci are within normal limits in size and symmetry. No abnormal extra-axial fluid collections or masses are identified. Some continued motion degradation. Vascular: No hyperdense vessel or unexpected calcification. Skull: Normal. Negative for fracture or focal lesion. Sinuses/Orbits: Hypoplastic frontal sinuses.  Otherwise negative. Other: None. IMPRESSION: Atrophy.  No bleed or other acute intracranial process. Electronically Signed   By: Corlis Leak  Hassell M.D.   On: 10/05/2017 08:29   Ct Angio Neck W Or Wo Contrast  Result Date: 10/05/2017 CLINICAL DATA:  Code stroke. Evaluate posterior circulation, speech difficulty. EXAM: CT HEAD WITHOUT CONTRAST CT ANGIOGRAPHY HEAD AND NECK TECHNIQUE: Multidetector CT imaging of the head and neck was performed using the standard protocol during bolus administration of intravenous contrast. Multiplanar CT image reconstructions and MIPs were obtained to evaluate the vascular anatomy. Carotid stenosis measurements (when applicable) are obtained utilizing NASCET criteria, using the distal internal carotid diameter as the denominator. CONTRAST:  50mL ISOVUE-370 IOPAMIDOL (ISOVUE-370) INJECTION 76% COMPARISON:  10/05/2017 CT head. 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 Right occipital lobe small cortical lucency without volume loss, possibly acute or early subacute infarction. Stable age indeterminate lacunar infarction within the right lentiform nucleus. No hemorrhage, mass effect, extra-axial collection, hydrocephalus, or herniation. Vascular: As below. Skull: Normal. Negative  for fracture or focal lesion. Sinuses: Imaged portions are clear. Orbits: No acute finding. Review of the MIP images confirms the above findings CTA NECK FINDINGS Aortic arch: Standard branching. Imaged portion shows no evidence of aneurysm or dissection. 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 Right carotid system: No evidence of dissection, stenosis (50% or greater) or occlusion. Moderate calcified plaque of the carotid bifurcation with mild less 50% proximal ICA stenosis. Left carotid system: No evidence of dissection, stenosis (50% or greater) or occlusion. Moderate calcified plaque of the left common carotid artery and the carotid bifurcation with minimal less than 30% stenosis. Vertebral arteries: Right dominant vertebral artery system. Widely patent right vertebral artery. 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 The vertebral artery downstream to the C4 level is diminutive, but patent. At the C2 level the patent lumen  is thread-like (series 8, image 110) and the vessel itself is mildly widened suggesting dissection. Skeleton: Moderate cervical spondylosis greatest at the C4-C6 levels. No high-grade bony canal stenosis. Other neck: 11 mm calcified nodule within the left thyroid gland. Upper chest: Negative. Review of the MIP images confirms the above findings CTA HEAD FINDINGS Anterior circulation: No significant stenosis, proximal occlusion, aneurysm, or vascular malformation. Calcified plaque of the carotid siphons with mild left distal cavernous stenosis. Posterior circulation: No significant stenosis, proximal occlusion, aneurysm, or vascular malformation. Venous sinuses: As permitted by contrast timing, patent. Anatomic variants: Complete circle-of-Willis. Review of the MIP images confirms the above findings IMPRESSION: CT head: 1. 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 Likely  chronic small cortical infarction in the right parietal cortex. 2. No new stroke, hemorrhage, or mass effect identified. CTA neck: 1. 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 Patent vessel downstream to the C4 level. 2. Thread-like severe stenosis of left vertebral artery at the C2 level with mild widening of the vessel may represent dissection. 3. Patent bilateral carotid systems and right vertebral artery. No dissection, aneurysm, or significant stenosis by NASCET criteria. CTA head: 1. No large vessel occlusion, aneurysm, or significant stenosis is identified. 2. 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 These results were called by telephone at the time of interpretation on 10/05/2017 at 4:23 pm to Dr. Ritta Slot, who verbally acknowledged these results. Electronically Signed   By: Mitzi Hansen M.D.   On: 10/05/2017 16:24   Mr Brain Wo Contrast  Result Date: 10/07/2017 CLINICAL DATA:  57 year old female with altered mental status and seizure. Age indeterminate small infarcts suspected in the right putamen and right occipital lobe on recent head CT. Abnormal left vertebral artery on recent CTA. EXAM: MRI HEAD WITHOUT CONTRAST TECHNIQUE: Multiplanar, multiecho pulse sequences of the brain and surrounding structures were obtained without intravenous contrast. COMPARISON:  Head CT and CTA head and neck 10/05/2017 FINDINGS: The examination had to be discontinued prior to completion due to patient agitation, inability to cooperate. Good quality axial diffusion weighted imaging was obtained. No restricted diffusion or acute infarct. There is facilitated diffusion in the right occipital lobe on series 6, image 16 corresponding to the recent CT hypodensity compatible with chronic encephalomalacia. There is likewise mild asymmetric facilitated diffusion in the right putamen. No other abnormal signal identified on diffusion-weighted imaging;  no abnormal diffusion in the hippocampal formations. Only severely motion degraded axial T2 weighted imaging was obtained in addition to the diffusion. No intracranial mass effect or ventriculomegaly. IMPRESSION: 1. Good quality axial diffusion weighted imaging without evidence of acute infarct. Evidence of chronic encephalomalacia corresponding to the right putamen and right occipital findings on CT. 2. Only motion degraded T2 weighted imaging was otherwise obtained before the study had to be discontinued prior to completion due to patient agitation. Electronically Signed   By: Odessa Fleming M.D.   On: 10/07/2017 11:16   Ct Head Code Stroke Wo Contrast  Result Date: 10/05/2017 CLINICAL DATA:  Code stroke. Evaluate posterior circulation, speech difficulty. EXAM: CT HEAD WITHOUT CONTRAST CT ANGIOGRAPHY HEAD AND NECK TECHNIQUE: Multidetector CT imaging of the head and neck was performed using the standard protocol during bolus administration of intravenous contrast. Multiplanar CT image reconstructions and MIPs were obtained to evaluate the vascular anatomy. Carotid stenosis measurements (when applicable) are obtained utilizing NASCET criteria, using the distal internal carotid diameter as the denominator. CONTRAST:  50mL ISOVUE-370 IOPAMIDOL (ISOVUE-370) INJECTION 76% COMPARISON:  10/05/2017 CT head. 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 Right occipital lobe small cortical lucency without volume loss, possibly acute or early subacute infarction. Stable age indeterminate lacunar infarction within the right lentiform nucleus. No hemorrhage, mass effect, extra-axial collection, hydrocephalus, or herniation. Vascular: As below. Skull: Normal. Negative for fracture or focal lesion. Sinuses: Imaged portions are clear. Orbits: No acute finding. Review of the MIP images confirms the above findings CTA NECK FINDINGS Aortic arch: Standard branching.  Imaged portion shows no evidence of aneurysm or dissection. 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 Right carotid system: No evidence of dissection, stenosis (50% or greater) or occlusion. Moderate calcified plaque of the carotid bifurcation with mild less 50% proximal ICA stenosis. Left carotid system: No evidence of dissection, stenosis (50% or greater) or occlusion. Moderate calcified plaque of the left common carotid artery and the carotid bifurcation with minimal less than 30% stenosis. Vertebral arteries: Right dominant vertebral artery system. Widely patent right vertebral artery. 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 The vertebral artery downstream to the C4 level is diminutive, but patent. At the C2 level the patent lumen is thread-like (series 8, image 110) and the vessel itself is mildly widened suggesting dissection. Skeleton: Moderate cervical spondylosis greatest at the C4-C6 levels. No high-grade bony canal stenosis. Other neck: 11 mm calcified nodule within the left thyroid gland. Upper chest: Negative. Review of the MIP images confirms the above findings CTA HEAD FINDINGS Anterior circulation: No significant stenosis, proximal occlusion, aneurysm, or vascular malformation. Calcified plaque of the carotid siphons with mild left distal cavernous stenosis. Posterior circulation: No significant stenosis, proximal occlusion, aneurysm, or vascular malformation. Venous sinuses: As permitted by contrast timing, patent. Anatomic variants: Complete circle-of-Willis. Review of the MIP images confirms the above findings IMPRESSION: CT head: 1. 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 2. No new stroke, hemorrhage, or mass effect identified. CTA neck: 1. 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 Patent  vessel downstream to the C4 level. 2. Thread-like severe stenosis of left vertebral artery at the C2 level with mild widening of the vessel may represent dissection. 3. Patent bilateral carotid systems and right vertebral artery. No dissection, aneurysm, or significant stenosis by NASCET criteria. CTA head: 1. No large vessel occlusion, aneurysm, or significant stenosis is identified. 2. 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 These results were called by telephone at the time of interpretation on 10/05/2017 at 4:23 pm to Dr. Ritta Slot, who verbally acknowledged these results. Electronically Signed   By: Mitzi Hansen M.D.   On: 10/05/2017 16:24     LOS: 4 days   Jeoffrey Massed, MD  [facility]  If 7PM-7AM, please contact night-coverage  Please page via [email or web address] [email or web address]-click on MD name and type text message  10/09/2017, 12:05 PM

## 2017-10-10 DIAGNOSIS — R569 Unspecified convulsions: Secondary | ICD-10-CM

## 2017-10-10 DIAGNOSIS — E440 Moderate protein-calorie malnutrition: Secondary | ICD-10-CM

## 2017-10-10 LAB — BASIC METABOLIC PANEL
Anion gap: 6 (ref 5–15)
CALCIUM: 7.7 mg/dL — AB (ref 8.9–10.3)
CO2: 19 mmol/L — ABNORMAL LOW (ref 22–32)
CREATININE: 0.62 mg/dL (ref 0.44–1.00)
Chloride: 111 mmol/L (ref 98–111)
GFR calc Af Amer: >60 mL/min (ref >60)
GLUCOSE: 102 mg/dL — AB (ref 70–99)
POTASSIUM: 3.4 mmol/L — AB (ref 3.5–5.1)
SODIUM: 136 mmol/L (ref 135–145)

## 2017-10-10 MED ORDER — LORATADINE 10 MG PO TABS: 10.0000 mg | ORAL_TABLET | Freq: Every day | ORAL | 0 refills | Status: DC

## 2017-10-10 MED ORDER — METOPROLOL TARTRATE 25 MG PO TABS: 25.0000 mg | ORAL_TABLET | Freq: Two times a day (BID) | ORAL | 0 refills | Status: DC

## 2017-10-10 MED ORDER — ENSURE ENLIVE PO LIQD: 237.0000 mL | Freq: Two times a day (BID) | ORAL | 0 refills | Status: DC

## 2017-10-10 MED ORDER — ALPRAZOLAM 0.5 MG PO TABS: ORAL_TABLET | ORAL | 5 refills | Status: DC

## 2017-10-10 MED ORDER — POTASSIUM CHLORIDE CRYS ER 20 MEQ PO TBCR
40.0000 meq | EXTENDED_RELEASE_TABLET | ORAL | Status: AC
  Administered 2017-10-10 (×2): 40 meq via ORAL
  Filled 2017-10-10 (×2): qty 2

## 2017-10-10 MED ORDER — FOLIC ACID 1 MG PO TABS: 1.0000 mg | ORAL_TABLET | Freq: Every day | ORAL | 0 refills | Status: DC

## 2017-10-10 MED ORDER — VITAMIN B-1 100 MG PO TABS: 100.0000 mg | ORAL_TABLET | Freq: Every day | ORAL | 0 refills | Status: DC

## 2017-10-10 NOTE — Care Management Note (Signed)
Case Management Note  Patient Details  Name: Smith Minceamela G Orellana MRN: [id number] Date of Birth: [birth year]  Subjective/Objective:                    Action/Plan: Pt discharging home with [facility]. CM provided her choice and she [facility]. [facility] was unable to accept the referral. Pt then selected [facility]. Alvino Chapelllen with [facility] accepted the referral.  Pt with orders for 3in 1. [facility] and will deliver the equipment to the room. Pt states her spouse will provide transportation home and supervision at home.   Expected Discharge Date:  10/10/17               Expected Discharge Plan:  Home w Home Health Services  In-House Referral:     Discharge planning Services  CM Consult  Post Acute Care Choice:  Durable Medical Equipment, Home Health Choice offered to:  Patient  DME Arranged:  3-N-1 DME Agency:  [facility]  HH Arranged:  RN, PT, OT, Nurse's Aide [facility]  [facility]  Status of Service:  Completed, signed off  If discussed at Long Length of Stay Meetings, dates discussed:    Additional Comments:  Kermit BaloKelli F Skylan Lara, RN 10/10/2017, 12:47 PM

## 2017-10-10 NOTE — Evaluation (Signed)
Occupational Therapy Evaluation Patient Details Name: Betty Nelson MRN: [id number] DOB: [birth year] Today's Date: 10/10/2017    History of Present Illness pt is a 57 y/o F w/ a PMH of HTN, Abdominal Aortic Aneurysm, depression, alcohol and substance abuse, and presents currently post-seizure w/ Todds Paralysis.   Clinical Impression   PTA Pt reports that she was mod I for ADL and functional transfers, using a RW PRN. Pt is currently overall min A with ADL - residual deficits in fine motor remain in LUE impacting grooming tasks when she open containers etc. Pt also presents with cognitive deficits - perseverating on getting her medicine throughout session, and decreased memory for education from OT. At this time, she will need HHOT post-acute as well as 24 hour supervision for safety. Pt is set to dc today and so will defer further OT and goal setting to HHOT.    Follow Up Recommendations  Home health OT;Supervision/Assistance - 24 hour    Equipment Recommendations  3 in 1 bedside commode    Recommendations for Other Services       Precautions / Restrictions Precautions Precautions: (Seizure) Restrictions Weight Bearing Restrictions: No      Mobility Bed Mobility Overal bed mobility: Needs Assistance Bed Mobility: Supine to Sit;Sit to Supine     Supine to sit: Min guard Sit to supine: Min guard   General bed mobility comments: min guard for safety; increased time and effort  Transfers Overall transfer level: Needs assistance Equipment used: Rolling walker (2 wheeled) Transfers: Sit to/from Stand Sit to Stand: Min guard         General transfer comment: cues for safe hand placement to stand and once in standing    Balance Overall balance assessment: Mild deficits observed, not formally tested                                         ADL either performed or assessed with clinical judgement   ADL Overall ADL's : Needs  assistance/impaired Eating/Feeding: Set up;Sitting   Grooming: Min guard   Upper Body Bathing: Minimal assistance   Lower Body Bathing: Minimal assistance   Upper Body Dressing : Minimal assistance   Lower Body Dressing: Moderate assistance   Toilet Transfer: Min guard;Ambulation;RW   Toileting- Architect and Hygiene: Min guard;Sit to/from stand   Tub/ Shower Transfer: Tub transfer;Minimal assistance;Ambulation;3 in 1;Rolling walker Tub/Shower Transfer Details (indicate cue type and reason): educated on uses of 3 in 1 for safety - sitting in the shower for safety Functional mobility during ADLs: Min guard;Rolling walker General ADL Comments: cognition impacting ADL independence - RN reports that it is worse today than yesterday. Suspect medication     Vision         Perception     Praxis      Pertinent Vitals/Pain Pain Assessment: No/denies pain Faces Pain Scale: No hurt Pain Intervention(s): Monitored during session;Repositioned     Hand Dominance Right   Extremity/Trunk Assessment Upper Extremity Assessment Upper Extremity Assessment: LUE deficits/detail LUE Deficits / Details: reduced shoulder FF (increased time and effort required) as well as dcreased fine motor abilities in L hand (strength groossly 4/5)  LUE Coordination: decreased fine motor;decreased gross motor   Lower Extremity Assessment Lower Extremity Assessment: Defer to PT evaluation   Cervical / Trunk Assessment Cervical / Trunk Assessment: Normal   Communication Communication Communication: No difficulties  Cognition Arousal/Alertness: Awake/alert Behavior During Therapy: Flat affect Overall Cognitive Status: No family/caregiver present to determine baseline cognitive functioning Area of Impairment: Attention;Memory;Following commands;Safety/judgement;Awareness;Problem solving                   Current Attention Level: Sustained Memory: Decreased recall of  precautions;Decreased short-term memory Following Commands: Follows one step commands inconsistently;Follows one step commands with increased time Safety/Judgement: Decreased awareness of safety;Decreased awareness of deficits Awareness: Intellectual Problem Solving: Slow processing;Decreased initiation;Difficulty sequencing;Requires verbal cues;Requires tactile cues General Comments: Per RN, cognition worse than yesterday. Suspect increased ativan last night. Pt perseverating on medicine throughout session   General Comments  Pt perseverating on medication throughout session. RN aware and managing medicinces on schedule.     Exercises     Shoulder Instructions      Home Living Family/patient expects to be discharged to:: Private residence Living Arrangements: Spouse/significant other;Children Available Help at Discharge: Family Type of Home: House Home Access: Stairs to enter Secretary/administratorntrance Stairs-Number of Steps: 2 Entrance Stairs-Rails: Can reach both Home Layout: One level     Bathroom Shower/Tub: Chief Strategy OfficerTub/shower unit   Bathroom Toilet: Standard Bathroom Accessibility: Yes How Accessible: Accessible via walker     Additional Comments: Pt reports distance from parking to home is 30 ft      Prior Functioning/Environment Level of Independence: Independent with assistive device(s)        Comments: Pt report inconsistent; she needed the walker to get around, "depending on what (she) was doing," but also reports not using it.        OT Problem List: Decreased strength;Decreased range of motion;Decreased activity tolerance;Impaired balance (sitting and/or standing);Decreased cognition;Decreased coordination;Decreased safety awareness;Decreased knowledge of use of DME or AE;Impaired UE functional use      OT Treatment/Interventions:      OT Goals(Current goals can be found in the care plan section) Acute Rehab OT Goals Patient Stated Goal: To go home OT Goal Formulation: With  patient Time For Goal Achievement: 10/24/17 Potential to Achieve Goals: Good  OT Frequency:     Barriers to D/C:            Co-evaluation              AM-PAC PT "6 Clicks" Daily Activity     Outcome Measure Help from another person eating meals?: A Little Help from another person taking care of personal grooming?: A Little Help from another person toileting, which includes using toliet, bedpan, or urinal?: A Little Help from another person bathing (including washing, rinsing, drying)?: A Little Help from another person to put on and taking off regular upper body clothing?: A Little Help from another person to put on and taking off regular lower body clothing?: A Lot 6 Click Score: 17   End of Session Equipment Utilized During Treatment: Gait belt;Rolling walker Nurse Communication: Mobility status;Patient requests pain meds  Activity Tolerance: Patient tolerated treatment well Patient left: in bed;with call bell/phone within reach;with nursing/sitter in room(all 4 rails up)  OT Visit Diagnosis: Unsteadiness on feet (R26.81);Other abnormalities of gait and mobility (R26.89);Muscle weakness (generalized) (M62.81);Other symptoms and signs involving cognitive function                Time: 1055-1110 OT Time Calculation (min): 15 min Charges:  OT General Charges $OT Visit: 1 Visit OT Evaluation $OT Eval Moderate Complexity: 1 Mod G-Codes:     Sherryl MangesLaura Cashel Bellina OTR/L [phone]  Evern BioLaura J Braxtin Bamba 10/10/2017, 12:25 PM

## 2017-10-10 NOTE — Discharge Summary (Signed)
PATIENT DETAILS Name: Betty Nelson Age: 57 y.o. Sex: female Date of Birth: 28-May-1960 MRN: [id number]. Admitting Physician: No admitting provider for patient encounter. NWG:NFAOZ, [place], [place]  Admit Date: 10/05/2017 Discharge date: 10/10/2017  Recommendations for Outpatient Follow-up:  1. Follow up with PCP in 1-2 weeks 2. Please obtain BMP/CBC in one week 3. Consider slowly tapering of Xanax in the next few months.  Patient advised not to abruptly stop taking Xanax as it can provoke her to have withdrawal seizures.  Admitted From:  Home  Disposition: Home   Home Health: Yes  Equipment/Devices: None  Discharge Condition: Stable  CODE STATUS: FULL CODE  Diet recommendation:  Heart Healthy  Brief Summary: See H&P, Labs, Consult and Test reports for all details in brief,Patient is a 57 y.o. female with long-standing history of alcohol use, depression, chronic benzodiazepine use any pain hospital after she was found down in the home from apparent seizures.  Subsequently transferred to [facility] for further evaluation.  Hospital course complicated by encephalopathy that is slowly improving with supportive care.  Currently thought to have alcohol withdrawal/benzodiazepine withdrawal seizures.  Brief Hospital Course: Seizures: secondary to alcohol and benzodiazepine withdrawal (stopped taking Xanax a few days prior to admission.  Neurology-Dr. Otelia Limes over the phone on 7/3-stopped Keppra, continue as needed benzodiazepine on discharge.  Patient asked not to abruptly stop taking Xanax, her PCP will need to slowly taper of Xanax in a matter of months.    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  MRI brain negative for CVA.  Is deconditioned-we will arrange for home health services on discharge.  Acute metabolic encephalopathy: Significantly improved-this is in the setting of alcohol withdrawal, postictal state from seizures.  MRI brain without any acute  abnormalities.  EEG negative for seizures.   Alcohol abuse: Managed with Ativan per CIWA protocol.  Counseled-not sure if she has any intention of quitting at this time.  Hypokalemia:  Repleted, continue to recheck periodically in the outpatient setting  Mild hyponatremia: Doubt further work-up required.  Moderate malnutrition: Continue supplements  Procedures/Studies: None  Discharge Diagnoses:  Principal Problem:   Todd's paralysis (HCC) Active Problems:   Essential hypertension   Alcohol abuse   Protein calorie malnutrition (HCC)   Depression   Malnutrition of moderate degree   Discharge Instructions:  Activity:  As tolerated with Full fall precautions use walker/cane & assistance as needed   Discharge Instructions    Diet - low sodium heart healthy   Complete by:  As directed    Discharge instructions   Complete by:  As directed    Follow with Primary MD  Remus Loffler, PA-C in 1 week  Please not stop taking Xanax abruptly-you need to be slowly tapered of these medications-please contact your primary care practitioner before you run out of this medication, please ask your primary care practitioner to taper you off this medications slowly.  Please get a complete blood count and chemistry panel checked by your Primary MD at your next visit, and again as instructed by your Primary MD.  Please do not drive, operate heavy machinery until you are cleared by your outpatient MD/primary care practitioner  Get Medicines reviewed and adjusted: Please take all your medications with you for your next visit with your Primary MD  Laboratory/radiological data: Please request your Primary MD to go over all hospital tests and procedure/radiological results at the follow up, please ask your Primary MD to get all Hospital records sent to his/her  office.  In some cases, they will be blood work, cultures and biopsy results pending at the time of your discharge. Please request that  your primary care M.D. follows up on these results.  Also Note the following: If you experience worsening of your admission symptoms, develop shortness of breath, life threatening emergency, suicidal or homicidal thoughts you must seek medical attention immediately by calling 911 or calling your MD immediately  if symptoms less severe.  You must read complete instructions/literature along with all the possible adverse reactions/side effects for all the Medicines you take and that have been prescribed to you. Take any new Medicines after you have completely understood and accpet all the possible adverse reactions/side effects.   Do not drive when taking Pain medications or sleeping medications (Benzodaizepines)  Do not take more than prescribed Pain, Sleep and Anxiety Medications. It is not advisable to combine anxiety,sleep and pain medications without talking with your primary care practitioner  Special Instructions: If you have smoked or chewed Tobacco  in the last 2 yrs please stop smoking, stop any regular Alcohol  and or any Recreational drug use.  Wear Seat belts while driving.  Please note: You were cared for by a hospitalist during your hospital stay. Once you are discharged, your primary care physician will handle any further medical issues. Please note that NO REFILLS for any discharge medications will be authorized once you are discharged, as it is imperative that you return to your primary care physician (or establish a relationship with a primary care physician if you do not have one) for your post hospital discharge needs so that they can reassess your need for medications and monitor your lab values.   Increase activity slowly   Complete by:  As directed      Allergies as of 10/10/2017   No Known Allergies     Medication List    TAKE these medications   ALPRAZolam 0.5 MG tablet Commonly known as:  XANAX Take 1 Tablet by mouth 2 times a day as needed What changed:    how  much to take  how to take this  when to take this  reasons to take this  additional instructions   citalopram 40 MG tablet Commonly known as:  CELEXA Take 1 tablet (40 mg total) by mouth daily.   cyclobenzaprine 10 MG tablet Commonly known as:  FLEXERIL Take 1 tablet (10 mg total) by mouth 2 (two) times daily. What changed:    when to take this  reasons to take this   feeding supplement (ENSURE ENLIVE) Liqd Take 237 mLs by mouth 2 (two) times daily between meals.   folic acid 1 MG tablet Commonly known as:  FOLVITE Take 1 tablet (1 mg total) by mouth daily. Start taking on:  10/11/2017   HYDROcodone-acetaminophen 10-325 MG tablet Commonly known as:  NORCO Take 1-2 tablets by mouth every 6 (six) hours as needed. What changed:  reasons to take this   loratadine 10 MG tablet Commonly known as:  CLARITIN Take 1 tablet (10 mg total) by mouth daily.   megestrol 40 MG tablet Commonly known as:  MEGACE TAKE 1 TABLET BY MOUTH 2 TIMES A DAY   metoprolol tartrate 25 MG tablet Commonly known as:  LOPRESSOR Take 1 tablet (25 mg total) by mouth 2 (two) times daily.   thiamine 100 MG tablet Commonly known as:  VITAMIN B-1 Take 1 tablet (100 mg total) by mouth daily.   traZODone 100 MG tablet Commonly  known as:  DESYREL TAKE 1 TABLET BY MOUTH EVERY DAY AT BEDTIME AS NEEDED FOR SLEEP      Follow-up Information    Remus Loffler, PA-C. Schedule an appointment as soon as possible for a visit in 1 week(s).   Specialties:  Physician Assistant, Family Medicine Contact information: [address] [place] [address] [phone]          No Known Allergies  Consultations:   neurology  Other Procedures/Studies: Ct Angio Head W Or Wo Contrast  Result Date: 10/05/2017 CLINICAL DATA:  Code stroke. Evaluate posterior circulation, speech difficulty. EXAM: CT HEAD WITHOUT CONTRAST CT ANGIOGRAPHY HEAD AND NECK TECHNIQUE: Multidetector CT imaging of the head and neck was  performed using the standard protocol during bolus administration of intravenous contrast. Multiplanar CT image reconstructions and MIPs were obtained to evaluate the vascular anatomy. Carotid stenosis measurements (when applicable) are obtained utilizing NASCET criteria, using the distal internal carotid diameter as the denominator. CONTRAST:  50mL ISOVUE-370 IOPAMIDOL (ISOVUE-370) INJECTION 76% COMPARISON:  10/05/2017 CT head. 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 Right occipital lobe small cortical lucency without volume loss, possibly acute or early subacute infarction. Stable age indeterminate lacunar infarction within the right lentiform nucleus. No hemorrhage, mass effect, extra-axial collection, hydrocephalus, or herniation. Vascular: As below. Skull: Normal. Negative for fracture or focal lesion. Sinuses: Imaged portions are clear. Orbits: No acute finding. Review of the MIP images confirms the above findings CTA NECK FINDINGS Aortic arch: Standard branching. Imaged portion shows no evidence of aneurysm or dissection. 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 Right carotid system: No evidence of dissection, stenosis (50% or greater) or occlusion. Moderate calcified plaque of the carotid bifurcation with mild less 50% proximal ICA stenosis. Left carotid system: No evidence of dissection, stenosis (50% or greater) or occlusion. Moderate calcified plaque of the left common carotid artery and the carotid bifurcation with minimal less than 30% stenosis. Vertebral arteries: Right dominant vertebral artery system. Widely patent right vertebral artery. 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 The vertebral artery downstream to the C4 level is diminutive, but patent. At the C2 level the patent lumen is thread-like (series 8, image 110) and the vessel itself is mildly widened  suggesting dissection. Skeleton: Moderate cervical spondylosis greatest at the C4-C6 levels. No high-grade bony canal stenosis. Other neck: 11 mm calcified nodule within the left thyroid gland. Upper chest: Negative. Review of the MIP images confirms the above findings CTA HEAD FINDINGS Anterior circulation: No significant stenosis, proximal occlusion, aneurysm, or vascular malformation. Calcified plaque of the carotid siphons with mild left distal cavernous stenosis. Posterior circulation: No significant stenosis, proximal occlusion, aneurysm, or vascular malformation. Venous sinuses: As permitted by contrast timing, patent. Anatomic variants: Complete circle-of-Willis. Review of the MIP images confirms the above findings IMPRESSION: CT head: 1. 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 2. No new stroke, hemorrhage, or mass effect identified. CTA neck: 1. 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 Patent vessel downstream to the C4 level. 2. Thread-like severe stenosis of left vertebral artery at the C2 level with mild widening of the vessel may represent dissection. 3. Patent bilateral carotid systems and right vertebral artery. No dissection, aneurysm, or significant stenosis by NASCET criteria. CTA head: 1. No large vessel occlusion, aneurysm, or  significant stenosis is identified. 2. 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 These results were called by telephone at the time of interpretation on 10/05/2017 at 4:23 pm to Dr. Ritta Slot, who verbally acknowledged these results. Electronically Signed   By: Mitzi Hansen M.D.   On: 10/05/2017 16:24   Dg Chest 1 View  Result Date: 10/05/2017 CLINICAL DATA:  [place], unresponsive, unable to cooperate, held for film EXAM: CHEST  1 VIEW COMPARISON:  none FINDINGS:  Lungs are clear. Heart size and mediastinal contours are within normal limits. Aortic Atherosclerosis (ICD10-170.0). No effusion. Visualized bones unremarkable. IMPRESSION: No acute cardiopulmonary disease. Electronically Signed   By: Corlis Leak M.D.   On: 10/05/2017 08:29   Ct Head Wo Contrast  Result Date: 10/05/2017 CLINICAL DATA:  walking and collapsed stiff with possible seizure activity. Pt had seizure activity en route to hospital with ems and was incontinent of urine. Given Versed 2.5mg  for this. Pt is neglecting right side of body with no verbal response. EXAM: CT HEAD WITHOUT CONTRAST TECHNIQUE: Contiguous axial images were obtained from the base of the skull through the vertex without intravenous contrast. Technologist notes Multiple attempts to scan patient with multiple restraints tried before diagnostic exam achieved./ COMPARISON:  None. FINDINGS: Brain: Mild atrophy. There is no evidence of acute intracranial hemorrhage, brain edema, mass lesion, acute infarction, mass effect, or midline shift. Acute infarct may be inapparent on noncontrast CT. No other intra-axial abnormalities are seen, and the ventricles and sulci are within normal limits in size and symmetry. No abnormal extra-axial fluid collections or masses are identified. Some continued motion degradation. Vascular: No hyperdense vessel or unexpected calcification. Skull: Normal. Negative for fracture or focal lesion. Sinuses/Orbits: Hypoplastic frontal sinuses.  Otherwise negative. Other: None. IMPRESSION: Atrophy.  No bleed or other acute intracranial process. Electronically Signed   By: Corlis Leak M.D.   On: 10/05/2017 08:29   Ct Angio Neck W Or Wo Contrast  Result Date: 10/05/2017 CLINICAL DATA:  Code stroke. Evaluate posterior circulation, speech difficulty. EXAM: CT HEAD WITHOUT CONTRAST CT ANGIOGRAPHY HEAD AND NECK TECHNIQUE: Multidetector CT imaging of the head and neck was performed using the standard protocol during bolus  administration of intravenous contrast. Multiplanar CT image reconstructions and MIPs were obtained to evaluate the vascular anatomy. Carotid stenosis measurements (when applicable) are obtained utilizing NASCET criteria, using the distal internal carotid diameter as the denominator. CONTRAST:  50mL ISOVUE-370 IOPAMIDOL (ISOVUE-370) INJECTION 76% COMPARISON:  10/05/2017 CT head. 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 Right occipital lobe small cortical lucency without volume loss, possibly acute or early subacute infarction. Stable age indeterminate lacunar infarction within the right lentiform nucleus. No hemorrhage, mass effect, extra-axial collection, hydrocephalus, or herniation. Vascular: As below. Skull: Normal. Negative for fracture or focal lesion. Sinuses: Imaged portions are clear. Orbits: No acute finding. Review of the MIP images confirms the above findings CTA NECK FINDINGS Aortic arch: Standard branching. Imaged portion shows no evidence of aneurysm or dissection. 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 Right carotid system: No evidence of dissection, stenosis (50% or greater) or occlusion. Moderate calcified plaque of the carotid bifurcation with mild less 50% proximal ICA stenosis. Left carotid system: No evidence of dissection, stenosis (50% or greater) or occlusion. Moderate calcified plaque of the left common carotid artery and the carotid bifurcation with minimal less than 30% stenosis. Vertebral arteries: Right dominant vertebral artery  system. Widely patent right vertebral artery. 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 The vertebral artery downstream to the C4 level is diminutive, but patent. At the C2 level the patent lumen is thread-like (series 8, image 110) and the vessel itself is mildly widened suggesting dissection. Skeleton: Moderate cervical  spondylosis greatest at the C4-C6 levels. No high-grade bony canal stenosis. Other neck: 11 mm calcified nodule within the left thyroid gland. Upper chest: Negative. Review of the MIP images confirms the above findings CTA HEAD FINDINGS Anterior circulation: No significant stenosis, proximal occlusion, aneurysm, or vascular malformation. Calcified plaque of the carotid siphons with mild left distal cavernous stenosis. Posterior circulation: No significant stenosis, proximal occlusion, aneurysm, or vascular malformation. Venous sinuses: As permitted by contrast timing, patent. Anatomic variants: Complete circle-of-Willis. Review of the MIP images confirms the above findings IMPRESSION: CT head: 1. 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 2. No new stroke, hemorrhage, or mass effect identified. CTA neck: 1. 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 Patent vessel downstream to the C4 level. 2. Thread-like severe stenosis of left vertebral artery at the C2 level with mild widening of the vessel may represent dissection. 3. Patent bilateral carotid systems and right vertebral artery. No dissection, aneurysm, or significant stenosis by NASCET criteria. CTA head: 1. No large vessel occlusion, aneurysm, or significant stenosis is identified. 2. 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 These results were called by telephone at the time of interpretation on 10/05/2017 at 4:23 pm to Dr. Ritta Slot, who verbally acknowledged these results. Electronically Signed   By: Mitzi Hansen M.D.   On: 10/05/2017 16:24   Mr Brain Wo Contrast  Result Date: 10/07/2017 CLINICAL DATA:  57 year old female with altered mental status and seizure. Age indeterminate small infarcts suspected in the right putamen and right occipital lobe on  recent head CT. Abnormal left vertebral artery on recent CTA. EXAM: MRI HEAD WITHOUT CONTRAST TECHNIQUE: Multiplanar, multiecho pulse sequences of the brain and surrounding structures were obtained without intravenous contrast. COMPARISON:  Head CT and CTA head and neck 10/05/2017 FINDINGS: The examination had to be discontinued prior to completion due to patient agitation, inability to cooperate. Good quality axial diffusion weighted imaging was obtained. No restricted diffusion or acute infarct. There is facilitated diffusion in the right occipital lobe on series 6, image 16 corresponding to the recent CT hypodensity compatible with chronic encephalomalacia. There is likewise mild asymmetric facilitated diffusion in the right putamen. No other abnormal signal identified on diffusion-weighted imaging; no abnormal diffusion in the hippocampal formations. Only severely motion degraded axial T2 weighted imaging was obtained in addition to the diffusion. No intracranial mass effect or ventriculomegaly. IMPRESSION: 1. Good quality axial diffusion weighted imaging without evidence of acute infarct. Evidence of chronic encephalomalacia corresponding to the right putamen and right occipital findings on CT. 2. Only motion degraded T2 weighted imaging was otherwise obtained before the study had to be discontinued prior to completion due to patient agitation. Electronically Signed   By: Odessa Fleming M.D.   On: 10/07/2017 11:16   Ct Head Code Stroke Wo Contrast  Result Date: 10/05/2017 CLINICAL DATA:  Code stroke. Evaluate posterior circulation, speech difficulty. EXAM: CT HEAD WITHOUT CONTRAST CT ANGIOGRAPHY HEAD AND NECK TECHNIQUE: Multidetector CT imaging of the head and neck was performed using the standard protocol during bolus administration of  intravenous contrast. Multiplanar CT image reconstructions and MIPs were obtained to evaluate the vascular anatomy. Carotid stenosis measurements (when applicable) are obtained  utilizing NASCET criteria, using the distal internal carotid diameter as the denominator. CONTRAST:  50mL ISOVUE-370 IOPAMIDOL (ISOVUE-370) INJECTION 76% COMPARISON:  10/05/2017 CT head. 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 Right occipital lobe small cortical lucency without volume loss, possibly acute or early subacute infarction. Stable age indeterminate lacunar infarction within the right lentiform nucleus. No hemorrhage, mass effect, extra-axial collection, hydrocephalus, or herniation. Vascular: As below. Skull: Normal. Negative for fracture or focal lesion. Sinuses: Imaged portions are clear. Orbits: No acute finding. Review of the MIP images confirms the above findings CTA NECK FINDINGS Aortic arch: Standard branching. Imaged portion shows no evidence of aneurysm or dissection. 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 Right carotid system: No evidence of dissection, stenosis (50% or greater) or occlusion. Moderate calcified plaque of the carotid bifurcation with mild less 50% proximal ICA stenosis. Left carotid system: No evidence of dissection, stenosis (50% or greater) or occlusion. Moderate calcified plaque of the left common carotid artery and the carotid bifurcation with minimal less than 30% stenosis. Vertebral arteries: Right dominant vertebral artery system. Widely patent right vertebral artery. 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 The vertebral artery downstream to the C4 level is diminutive, but patent. At the C2 level the patent lumen is thread-like (series 8, image 110) and the vessel itself is mildly widened suggesting dissection. Skeleton: Moderate cervical spondylosis greatest at the C4-C6 levels. No high-grade bony canal stenosis. Other neck: 11 mm calcified nodule within the left thyroid gland. Upper chest: Negative. Review of the MIP images  confirms the above findings CTA HEAD FINDINGS Anterior circulation: No significant stenosis, proximal occlusion, aneurysm, or vascular malformation. Calcified plaque of the carotid siphons with mild left distal cavernous stenosis. Posterior circulation: No significant stenosis, proximal occlusion, aneurysm, or vascular malformation. Venous sinuses: As permitted by contrast timing, patent. Anatomic variants: Complete circle-of-Willis. Review of the MIP images confirms the above findings IMPRESSION: CT head: 1. 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 2. No new stroke, hemorrhage, or mass effect identified. CTA neck: 1. 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 Patent vessel downstream to the C4 level. 2. Thread-like severe stenosis of left vertebral artery at the C2 level with mild widening of the vessel may represent dissection. 3. Patent bilateral carotid systems and right vertebral artery. No dissection, aneurysm, or significant stenosis by NASCET criteria. CTA head: 1. No large vessel occlusion, aneurysm, or significant stenosis is identified. 2. 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 These results were called by telephone at the time of interpretation on 10/05/2017 at 4:23 pm to Dr. Ritta Slot, who verbally acknowledged these results. Electronically Signed   By: Mitzi Hansen M.D.   On: 10/05/2017 16:24     TODAY-DAY OF DISCHARGE:  Subjective:   Betty Nelson today has no headache,no chest abdominal pain,no new weakness tingling or numbness, feels much better wants to go home today.  Objective:   Blood pressure 137/80, pulse 92, temperature 99.3 F (37.4 C), temperature source Oral, resp. rate 20, height 5\' 3"  (1.6 m), weight 45.4 kg (100 lb), SpO2 99 %.  Intake/Output Summary (Last 24 hours)  at 10/10/2017 1032 Last  data filed at 10/10/2017 0815 Gross per 24 hour  Intake 480 ml  Output -  Net 480 ml   Filed Weights   10/05/17 0739  Weight: 45.4 kg (100 lb)    Exam: Awake Alert, Oriented *3, No new F.N deficits, Normal affect [place].AT,PERRAL Supple Neck,No JVD, No cervical lymphadenopathy appriciated.  Symmetrical Chest wall movement, Good air movement bilaterally, CTAB RRR,No Gallops,Rubs or new Murmurs, No Parasternal Heave +ve B.Sounds, Abd Soft, Non tender, No organomegaly appriciated, No rebound -guarding or rigidity. No Cyanosis, Clubbing or edema, No new Rash or bruise   PERTINENT RADIOLOGIC STUDIES: Ct Angio Head W Or Wo Contrast  Result Date: 10/05/2017 CLINICAL DATA:  Code stroke. Evaluate posterior circulation, speech difficulty. EXAM: CT HEAD WITHOUT CONTRAST CT ANGIOGRAPHY HEAD AND NECK TECHNIQUE: Multidetector CT imaging of the head and neck was performed using the standard protocol during bolus administration of intravenous contrast. Multiplanar CT image reconstructions and MIPs were obtained to evaluate the vascular anatomy. Carotid stenosis measurements (when applicable) are obtained utilizing NASCET criteria, using the distal internal carotid diameter as the denominator. CONTRAST:  50mL ISOVUE-370 IOPAMIDOL (ISOVUE-370) INJECTION 76% COMPARISON:  10/05/2017 CT head. 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 Right occipital lobe small cortical lucency without volume loss, possibly acute or early subacute infarction. Stable age indeterminate lacunar infarction within the right lentiform nucleus. No hemorrhage, mass effect, extra-axial collection, hydrocephalus, or herniation. Vascular: As below. Skull: Normal. Negative for fracture or focal lesion. Sinuses: Imaged portions are clear. Orbits: No acute finding. Review of the MIP images confirms the above findings CTA NECK FINDINGS Aortic arch: Standard  branching. Imaged portion shows no evidence of aneurysm or dissection. 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 Right carotid system: No evidence of dissection, stenosis (50% or greater) or occlusion. Moderate calcified plaque of the carotid bifurcation with mild less 50% proximal ICA stenosis. Left carotid system: No evidence of dissection, stenosis (50% or greater) or occlusion. Moderate calcified plaque of the left common carotid artery and the carotid bifurcation with minimal less than 30% stenosis. Vertebral arteries: Right dominant vertebral artery system. Widely patent right vertebral artery. 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 The vertebral artery downstream to the C4 level is diminutive, but patent. At the C2 level the patent lumen is thread-like (series 8, image 110) and the vessel itself is mildly widened suggesting dissection. Skeleton: Moderate cervical spondylosis greatest at the C4-C6 levels. No high-grade bony canal stenosis. Other neck: 11 mm calcified nodule within the left thyroid gland. Upper chest: Negative. Review of the MIP images confirms the above findings CTA HEAD FINDINGS Anterior circulation: No significant stenosis, proximal occlusion, aneurysm, or vascular malformation. Calcified plaque of the carotid siphons with mild left distal cavernous stenosis. Posterior circulation: No significant stenosis, proximal occlusion, aneurysm, or vascular malformation. Venous sinuses: As permitted by contrast timing, patent. Anatomic variants: Complete circle-of-Willis. Review of the MIP images confirms the above findings IMPRESSION: CT head: 1. 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 2. No new stroke, hemorrhage, or mass effect identified. CTA neck: 1. 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 Patent vessel downstream to the C4 level. 2. Thread-like severe stenosis of left vertebral artery at the C2 level with mild widening of the vessel may represent dissection. 3. Patent bilateral carotid systems and  right vertebral artery. No dissection, aneurysm, or significant stenosis by NASCET criteria. CTA head: 1. No large vessel occlusion, aneurysm, or significant stenosis is identified. 2. 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 These results were called by telephone at the time of interpretation on 10/05/2017 at 4:23 pm to Dr. Ritta Slot, who verbally acknowledged these results. Electronically Signed   By: Mitzi Hansen M.D.   On: 10/05/2017 16:24   Dg Chest 1 View  Result Date: 10/05/2017 CLINICAL DATA:  [place], unresponsive, unable to cooperate, held for film EXAM: CHEST  1 VIEW COMPARISON:  none FINDINGS: Lungs are clear. Heart size and mediastinal contours are within normal limits. Aortic Atherosclerosis (ICD10-170.0). No effusion. Visualized bones unremarkable. IMPRESSION: No acute cardiopulmonary disease. Electronically Signed   By: Corlis Leak M.D.   On: 10/05/2017 08:29   Ct Head Wo Contrast  Result Date: 10/05/2017 CLINICAL DATA:  walking and collapsed stiff with possible seizure activity. Pt had seizure activity en route to hospital with ems and was incontinent of urine. Given Versed 2.5mg  for this. Pt is neglecting right side of body with no verbal response. EXAM: CT HEAD WITHOUT CONTRAST TECHNIQUE: Contiguous axial images were obtained from the base of the skull through the vertex without intravenous contrast. Technologist notes Multiple attempts to scan patient with multiple restraints tried before diagnostic exam achieved./ COMPARISON:  None. FINDINGS: Brain: Mild atrophy. There is no evidence of acute intracranial hemorrhage, brain edema, mass lesion, acute infarction, mass effect, or midline shift. Acute infarct may  be inapparent on noncontrast CT. No other intra-axial abnormalities are seen, and the ventricles and sulci are within normal limits in size and symmetry. No abnormal extra-axial fluid collections or masses are identified. Some continued motion degradation. Vascular: No hyperdense vessel or unexpected calcification. Skull: Normal. Negative for fracture or focal lesion. Sinuses/Orbits: Hypoplastic frontal sinuses.  Otherwise negative. Other: None. IMPRESSION: Atrophy.  No bleed or other acute intracranial process. Electronically Signed   By: Corlis Leak M.D.   On: 10/05/2017 08:29   Ct Angio Neck W Or Wo Contrast  Result Date: 10/05/2017 CLINICAL DATA:  Code stroke. Evaluate posterior circulation, speech difficulty. EXAM: CT HEAD WITHOUT CONTRAST CT ANGIOGRAPHY HEAD AND NECK TECHNIQUE: Multidetector CT imaging of the head and neck was performed using the standard protocol during bolus administration of intravenous contrast. Multiplanar CT image reconstructions and MIPs were obtained to evaluate the vascular anatomy. Carotid stenosis measurements (when applicable) are obtained utilizing NASCET criteria, using the distal internal carotid diameter as the denominator. CONTRAST:  50mL ISOVUE-370 IOPAMIDOL (ISOVUE-370) INJECTION 76% COMPARISON:  10/05/2017 CT head. 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 Right occipital lobe small cortical lucency without volume loss, possibly acute or early subacute infarction. Stable age indeterminate lacunar infarction within the right lentiform nucleus. No hemorrhage, mass effect, extra-axial collection, hydrocephalus, or herniation. Vascular: As below. Skull: Normal. Negative for fracture or focal lesion. Sinuses: Imaged portions are clear. Orbits: No acute finding. Review of the MIP images confirms the above findings CTA NECK FINDINGS Aortic arch: Standard branching. Imaged portion shows no evidence of  aneurysm or dissection. 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 Right carotid system: No evidence of dissection, stenosis (50% or greater) or occlusion. Moderate calcified plaque of the carotid bifurcation with mild less 50% proximal ICA stenosis. Left carotid system: No evidence of dissection, stenosis (50% or greater) or occlusion. Moderate calcified plaque of  the left common carotid artery and the carotid bifurcation with minimal less than 30% stenosis. Vertebral arteries: Right dominant vertebral artery system. Widely patent right vertebral artery. 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 The vertebral artery downstream to the C4 level is diminutive, but patent. At the C2 level the patent lumen is thread-like (series 8, image 110) and the vessel itself is mildly widened suggesting dissection. Skeleton: Moderate cervical spondylosis greatest at the C4-C6 levels. No high-grade bony canal stenosis. Other neck: 11 mm calcified nodule within the left thyroid gland. Upper chest: Negative. Review of the MIP images confirms the above findings CTA HEAD FINDINGS Anterior circulation: No significant stenosis, proximal occlusion, aneurysm, or vascular malformation. Calcified plaque of the carotid siphons with mild left distal cavernous stenosis. Posterior circulation: No significant stenosis, proximal occlusion, aneurysm, or vascular malformation. Venous sinuses: As permitted by contrast timing, patent. Anatomic variants: Complete circle-of-Willis. Review of the MIP images confirms the above findings IMPRESSION: CT head: 1. 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 2. No new stroke, hemorrhage, or mass effect identified. CTA neck: 1. 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 Patent vessel downstream to the C4 level. 2.  Thread-like severe stenosis of left vertebral artery at the C2 level with mild widening of the vessel may represent dissection. 3. Patent bilateral carotid systems and right vertebral artery. No dissection, aneurysm, or significant stenosis by NASCET criteria. CTA head: 1. No large vessel occlusion, aneurysm, or significant stenosis is identified. 2. 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 These results were called by telephone at the time of interpretation on 10/05/2017 at 4:23 pm to Dr. Ritta Slot, who verbally acknowledged these results. Electronically Signed   By: Mitzi Hansen M.D.   On: 10/05/2017 16:24   Mr Brain Wo Contrast  Result Date: 10/07/2017 CLINICAL DATA:  57 year old female with altered mental status and seizure. Age indeterminate small infarcts suspected in the right putamen and right occipital lobe on recent head CT. Abnormal left vertebral artery on recent CTA. EXAM: MRI HEAD WITHOUT CONTRAST TECHNIQUE: Multiplanar, multiecho pulse sequences of the brain and surrounding structures were obtained without intravenous contrast. COMPARISON:  Head CT and CTA head and neck 10/05/2017 FINDINGS: The examination had to be discontinued prior to completion due to patient agitation, inability to cooperate. Good quality axial diffusion weighted imaging was obtained. No restricted diffusion or acute infarct. There is facilitated diffusion in the right occipital lobe on series 6, image 16 corresponding to the recent CT hypodensity compatible with chronic encephalomalacia. There is likewise mild asymmetric facilitated diffusion in the right putamen. No other abnormal signal identified on diffusion-weighted imaging; no abnormal diffusion in the hippocampal formations. Only severely motion degraded axial T2 weighted imaging was obtained in addition to the diffusion. No intracranial mass effect or ventriculomegaly. IMPRESSION: 1. Good quality axial  diffusion weighted imaging without evidence of acute infarct. Evidence of chronic encephalomalacia corresponding to the right putamen and right occipital findings on CT. 2. Only motion degraded T2 weighted imaging was otherwise obtained before the study had to be discontinued prior to completion due to patient agitation. Electronically Signed   By: Odessa Fleming M.D.   On: 10/07/2017 11:16   Ct Head Code Stroke Wo Contrast  Result Date: 10/05/2017 CLINICAL DATA:  Code stroke. Evaluate posterior circulation, speech difficulty. EXAM: CT HEAD WITHOUT CONTRAST CT ANGIOGRAPHY HEAD  AND NECK TECHNIQUE: Multidetector CT imaging of the head and neck was performed using the standard protocol during bolus administration of intravenous contrast. Multiplanar CT image reconstructions and MIPs were obtained to evaluate the vascular anatomy. Carotid stenosis measurements (when applicable) are obtained utilizing NASCET criteria, using the distal internal carotid diameter as the denominator. CONTRAST:  50mL ISOVUE-370 IOPAMIDOL (ISOVUE-370) INJECTION 76% COMPARISON:  10/05/2017 CT head. 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 Right occipital lobe small cortical lucency without volume loss, possibly acute or early subacute infarction. Stable age indeterminate lacunar infarction within the right lentiform nucleus. No hemorrhage, mass effect, extra-axial collection, hydrocephalus, or herniation. Vascular: As below. Skull: Normal. Negative for fracture or focal lesion. Sinuses: Imaged portions are clear. Orbits: No acute finding. Review of the MIP images confirms the above findings CTA NECK FINDINGS Aortic arch: Standard branching. Imaged portion shows no evidence of aneurysm or dissection. 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 Right carotid system: No evidence of dissection, stenosis (50% or greater) or occlusion.  Moderate calcified plaque of the carotid bifurcation with mild less 50% proximal ICA stenosis. Left carotid system: No evidence of dissection, stenosis (50% or greater) or occlusion. Moderate calcified plaque of the left common carotid artery and the carotid bifurcation with minimal less than 30% stenosis. Vertebral arteries: Right dominant vertebral artery system. Widely patent right vertebral artery. 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 The vertebral artery downstream to the C4 level is diminutive, but patent. At the C2 level the patent lumen is thread-like (series 8, image 110) and the vessel itself is mildly widened suggesting dissection. Skeleton: Moderate cervical spondylosis greatest at the C4-C6 levels. No high-grade bony canal stenosis. Other neck: 11 mm calcified nodule within the left thyroid gland. Upper chest: Negative. Review of the MIP images confirms the above findings CTA HEAD FINDINGS Anterior circulation: No significant stenosis, proximal occlusion, aneurysm, or vascular malformation. Calcified plaque of the carotid siphons with mild left distal cavernous stenosis. Posterior circulation: No significant stenosis, proximal occlusion, aneurysm, or vascular malformation. Venous sinuses: As permitted by contrast timing, patent. Anatomic variants: Complete circle-of-Willis. Review of the MIP images confirms the above findings IMPRESSION: CT head: 1. 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 2. No new stroke, hemorrhage, or mass effect identified. CTA neck: 1. 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 Patent vessel downstream to the C4 level. 2. Thread-like severe stenosis of left vertebral artery at the C2 level with mild widening of the vessel may represent dissection. 3. Patent bilateral carotid systems and right vertebral artery. No  dissection, aneurysm, or significant stenosis by NASCET criteria. CTA head: 1. No large vessel occlusion, aneurysm, or significant stenosis is identified. 2. 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 These results were called by telephone at the time of interpretation on 10/05/2017 at 4:23 pm to Dr. Ritta Slot, who verbally acknowledged these results. Electronically Signed   By: Mitzi Hansen M.D.   On: 10/05/2017 16:24     PERTINENT LAB RESULTS: CBC: Recent Labs    10/08/17 0424  WBC 5.7  HGB 11.1*  HCT 31.9*  PLT 194   CMET CMP     Component Value Date/Time   NA 136 10/10/2017 0357   K 3.4 (L) 10/10/2017 0357   CL 111 10/10/2017  0357   CO2 19 (L) 10/10/2017 0357   GLUCOSE 102 (H) 10/10/2017 0357   BUN <5 (L) 10/10/2017 0357   CREATININE 0.62 10/10/2017 0357   CALCIUM 7.7 (L) 10/10/2017 0357   PROT 6.7 10/07/2017 0554   ALBUMIN 3.6 10/07/2017 0554   AST 64 (H) 10/07/2017 0554   ALT 31 10/07/2017 0554   ALKPHOS 40 10/07/2017 0554   BILITOT 1.3 (H) 10/07/2017 0554   GFRNONAA >60 10/10/2017 0357   GFRAA >60 10/10/2017 0357    GFR Estimated Creatinine Clearance: 54.3 mL/min (by C-G formula based on SCr of 0.62 mg/dL). No results for input(s): LIPASE, AMYLASE in the last 72 hours. No results for input(s): CKTOTAL, CKMB, CKMBINDEX, TROPONINI in the last 72 hours. Invalid input(s): POCBNP No results for input(s): DDIMER in the last 72 hours. No results for input(s): HGBA1C in the last 72 hours. No results for input(s): CHOL, HDL, LDLCALC, TRIG, CHOLHDL, LDLDIRECT in the last 72 hours. No results for input(s): TSH, T4TOTAL, T3FREE, THYROIDAB in the last 72 hours.  Invalid input(s): FREET3 No results for input(s): VITAMINB12, FOLATE, FERRITIN, TIBC, IRON, RETICCTPCT in the last 72 hours. Coags: No results for input(s): INR in the last 72 hours.  Invalid input(s): PT Microbiology: Recent Results (from the past  240 hour(s))  CSF culture     Status: None   Collection Time: 10/05/17  5:15 PM  Result Value Ref Range Status   Specimen Description CSF TUBE 2  Final   Special Requests NONE  Final   Gram Stain   Final    WBC PRESENT,BOTH PMN AND MONONUCLEAR NO ORGANISMS SEEN CYTOSPIN SMEAR    Culture   Final    NO GROWTH 3 DAYS Performed at [facility], [address] [place], [place] [address]    Report Status 10/08/2017 FINAL  Final    FURTHER DISCHARGE INSTRUCTIONS:  Get Medicines reviewed and adjusted: Please take all your medications with you for your next visit with your Primary MD  Laboratory/radiological data: Please request your Primary MD to go over all hospital tests and procedure/radiological results at the follow up, please ask your Primary MD to get all Hospital records sent to his/her office.  In some cases, they will be blood work, cultures and biopsy results pending at the time of your discharge. Please request that your primary care M.D. goes through all the records of your hospital data and follows up on these results.  Also Note the following: If you experience worsening of your admission symptoms, develop shortness of breath, life threatening emergency, suicidal or homicidal thoughts you must seek medical attention immediately by calling 911 or calling your MD immediately  if symptoms less severe.  You must read complete instructions/literature along with all the possible adverse reactions/side effects for all the Medicines you take and that have been prescribed to you. Take any new Medicines after you have completely understood and accpet all the possible adverse reactions/side effects.   Do not drive when taking Pain medications or sleeping medications (Benzodaizepines)  Do not take more than prescribed Pain, Sleep and Anxiety Medications. It is not advisable to combine anxiety,sleep and pain medications without talking with your primary care  practitioner  Special Instructions: If you have smoked or chewed Tobacco  in the last 2 yrs please stop smoking, stop any regular Alcohol  and or any Recreational drug use.  Wear Seat belts while driving.  Please note: You were cared for by a hospitalist during your hospital stay.  Once you are discharged, your primary care physician will handle any further medical issues. Please note that NO REFILLS for any discharge medications will be authorized once you are discharged, as it is imperative that you return to your primary care physician (or establish a relationship with a primary care physician if you do not have one) for your post hospital discharge needs so that they can reassess your need for medications and monitor your lab values.  Total Time spent coordinating discharge including counseling, education and face to face time equals  45 minutes.  SignedJeoffrey Massed: Shanker Ghimire 10/10/2017 10:32 AM

## 2017-10-10 NOTE — Progress Notes (Signed)
Nurse went over discharge with patient and family. Patient and family Verbalized understanding of discharge. All questions and concern addressed.Discharging home with all belongings. Takin down in a wheelchair.

## 2017-10-10 NOTE — Progress Notes (Signed)
Physical Therapy Treatment Patient Details Name: Betty Nelson MRN: [id number] DOB: 07-Apr-1961 Today's Date: 10/10/2017    History of Present Illness pt is a 57 y/o F w/ a PMH of HTN, Abdominal Aortic Aneurysm, depression, alcohol and substance abuse, and presents currently post-seizure w/ Todds Paralysis.    PT Comments    Patient is making progress toward PT goals. Pt required min guard/min A for OOB mobility. Pt demonstrates cognitive impairments however not sure of pt's baseline cognition. Pt required cues to complete all tasks during session and unable to follow multi step cues. Pt will need 24 hour supervision/assitance upon d/c and will continue to benefit from further skilled PT services to maximize independence and safety with mobility.    Follow Up Recommendations  Home health PT;Supervision/Assistance - 24 hour     Equipment Recommendations  None recommended by PT    Recommendations for Other Services OT consult     Precautions / Restrictions Precautions Precautions: (Seizure) Restrictions Weight Bearing Restrictions: No    Mobility  Bed Mobility Overal bed mobility: Needs Assistance Bed Mobility: Supine to Sit;Sit to Supine     Supine to sit: Min guard Sit to supine: Min guard   General bed mobility comments: min guard for safety; increased time and effort; pt reached for assistance from therapist but did not require physical assistance  Transfers Overall transfer level: Needs assistance Equipment used: Rolling walker (2 wheeled) Transfers: Sit to/from Stand Sit to Stand: Min guard         General transfer comment: cues for safe hand placement to stand and once in standing  Ambulation/Gait Ambulation/Gait assistance: Min guard;Min assist Gait Distance (Feet): 150 Feet Assistive device: Rolling walker (2 wheeled) Gait Pattern/deviations: Step-through pattern;Decreased step length - right;Decreased step length - left;Drifts right/left Gait velocity:  decreased   General Gait Details: pt drifts R and L with horizontal head turns and easily distracted by environment; pt unable to follow mulit step cues   Stairs Stairs: Yes Stairs assistance: Min guard Stair Management: Two rails;Alternating pattern;Forwards Number of Stairs: 2 General stair comments: cues for safety and sequencing   Wheelchair Mobility    Modified Rankin (Stroke Patients Only)       Balance Overall balance assessment: Mild deficits observed, not formally tested                                          Cognition Arousal/Alertness: Awake/alert Behavior During Therapy: Flat affect Overall Cognitive Status: No family/caregiver present to determine baseline cognitive functioning Area of Impairment: Attention;Memory;Following commands;Safety/judgement;Awareness;Problem solving                   Current Attention Level: Sustained Memory: Decreased recall of precautions;Decreased short-term memory Following Commands: Follows one step commands inconsistently;Follows one step commands with increased time Safety/Judgement: Decreased awareness of safety;Decreased awareness of deficits Awareness: Intellectual Problem Solving: Slow processing;Decreased initiation;Difficulty sequencing;Requires verbal cues;Requires tactile cues        Exercises      General Comments        Pertinent Vitals/Pain Pain Assessment: Faces Faces Pain Scale: No hurt    Home Living                      Prior Function            PT Goals (current goals can now be found in the care  plan section) Acute Rehab PT Goals Patient Stated Goal: To go home PT Goal Formulation: With patient Time For Goal Achievement: 10/23/17 Potential to Achieve Goals: Fair Progress towards PT goals: Progressing toward goals    Frequency    Min 3X/week      PT Plan Current plan remains appropriate    Co-evaluation              AM-PAC PT "6 Clicks"  Daily Activity  Outcome Measure  Difficulty turning over in bed (including adjusting bedclothes, sheets and blankets)?: None Difficulty moving from lying on back to sitting on the side of the bed? : A Little Difficulty sitting down on and standing up from a chair with arms (e.g., wheelchair, bedside commode, etc,.)?: Unable Help needed moving to and from a bed to chair (including a wheelchair)?: A Little Help needed walking in hospital room?: A Little Help needed climbing 3-5 steps with a railing? : A Little 6 Click Score: 17    End of Session Equipment Utilized During Treatment: Gait belt Activity Tolerance: Patient tolerated treatment well Patient left: with call bell/phone within reach;with nursing/sitter in room;in bed Nurse Communication: Mobility status PT Visit Diagnosis: Unsteadiness on feet (R26.81);Other abnormalities of gait and mobility (R26.89);Muscle weakness (generalized) (M62.81);Difficulty in walking, not elsewhere classified (R26.2);Pain     Time: [id number] PT Time Calculation (min) (ACUTE ONLY): 23 min  Charges:  $Gait Training: 8-22 mins $Therapeutic Activity: 8-22 mins                    G Codes:       Erline LevineKellyn Danyell Shader, PTA Pager: [phone]     Carolynne EdouardKellyn R Jaquez Farrington 10/10/2017, 9:41 AM

## 2017-10-13 ENCOUNTER — Encounter: Payer: Self-pay | Admitting: Physician Assistant

## 2017-10-13 ENCOUNTER — Ambulatory Visit (INDEPENDENT_AMBULATORY_CARE_PROVIDER_SITE_OTHER): Payer: Medicare Other | Admitting: Physician Assistant

## 2017-10-13 DIAGNOSIS — F331 Major depressive disorder, recurrent, moderate: Secondary | ICD-10-CM

## 2017-10-13 DIAGNOSIS — M5136 Other intervertebral disc degeneration, lumbar region: Secondary | ICD-10-CM

## 2017-10-13 MED ORDER — OMEPRAZOLE 20 MG PO CPDR: 20.0000 mg | DELAYED_RELEASE_CAPSULE | Freq: Every day | ORAL | 11 refills | Status: DC

## 2017-10-13 MED ORDER — ALPRAZOLAM 0.5 MG PO TABS: ORAL_TABLET | ORAL | 0 refills | Status: DC

## 2017-10-13 MED ORDER — HYDROCODONE-ACETAMINOPHEN 10-325 MG PO TABS: 1.0000 | ORAL_TABLET | Freq: Three times a day (TID) | ORAL | 0 refills | Status: DC

## 2017-10-14 NOTE — Progress Notes (Signed)
BP 112/65   Pulse 95   Ht 5\' 3"  (1.6 m)   Wt 103 lb 12.8 oz (47.1 kg)   BMI 18.39 kg/m    Subjective:    Patient ID: Betty Nelson, female    DOB: 06/19/1960, 57 y.o.   MRN: [id number]  HPI: Betty Nelson is a 57 y.o. female presenting on 10/13/2017 for Hospitalization Follow-up  This patient comes in for hospital follow-up.  She had over taken her pain medication and her Xanax and was found having seizures.  She was transported to the hospital.  She ended up spending almost a week there.  We had a long conversation about the reductions that are needed in her medication.  We have discussed that the Xanax will only be given twice a day for the next month and I will see her back in a month.  Her daughter is in the room with this and understands.  We are also going to have her only have the pain medicine on a very rare occasion and only if you are written.  She does have extremely bad degenerative disc disease.  She was even given disability because of this problem.  She is starting to become a little more clear.  She has been very confused throughout all of the hospital stay.  She states that she does not even remember the first 3 days of it.   Past Medical History:  Diagnosis Date  . Anxiety   . Bipolar disorder (HCC)   . Chronic lower back pain   . DDD (degenerative disc disease), lumbar   . Depression   . Disability of walking   . Headache    "about q wk" (10/09/2017)  . Hypertension   . Seizure (HCC) 10/05/2017   "that was my 1st" (10/09/2017)  . 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 Interim medical history since our last visit reviewed. Allergies and medications reviewed and updated. DATA REVIEWED: CHART IN EPIC  Family History reviewed for pertinent findings.  Review of Systems  Constitutional: Positive for fatigue and unexpected weight change.  HENT: Negative.   Eyes: Negative.    Respiratory: Negative.   Gastrointestinal: Negative.   Genitourinary: Negative.   Musculoskeletal: Positive for arthralgias, back pain and gait problem.  Neurological: Positive for weakness.  Psychiatric/Behavioral: Positive for confusion. Negative for suicidal ideas. The patient is nervous/anxious.     Allergies as of 10/13/2017   No Known Allergies     Medication List        Accurate as of 10/13/17 11:59 PM. Always use your most recent med list.          ALPRAZolam 0.5 MG tablet Commonly known as:  XANAX Take 1 Tablet by mouth 2 times a day as needed   citalopram 40 MG tablet Commonly known as:  CELEXA Take 1 tablet (40 mg total) by mouth daily.   cyclobenzaprine 10 MG tablet Commonly known as:  FLEXERIL Take 1 tablet (10 mg total) by mouth 2 (two) times daily.   feeding supplement (ENSURE ENLIVE) Liqd Take 237 mLs by mouth 2 (two) times daily between meals.   folic acid 1 MG tablet Commonly known as:  FOLVITE Take 1 tablet (1 mg total) by mouth daily.   HYDROcodone-acetaminophen 10-325 MG tablet Commonly known as:  NORCO Take 1 tablet by mouth every 8 (eight) hours.   loratadine 10 MG tablet Commonly known as:  CLARITIN Take 1 tablet (10 mg total) by mouth daily.   megestrol 40 MG tablet Commonly known as:  MEGACE TAKE 1 TABLET BY MOUTH 2 TIMES A DAY   metoprolol tartrate 25 MG tablet Commonly known as:  LOPRESSOR Take 1 tablet (25 mg total) by mouth 2 (two) times daily.   omeprazole 20 MG capsule Commonly known as:  PRILOSEC Take 1 capsule (20 mg total) by mouth daily.   thiamine 100 MG tablet Commonly known as:  VITAMIN B-1 Take 1 tablet (100 mg total) by mouth daily.   traZODone 100 MG tablet Commonly known as:  DESYREL TAKE 1 TABLET BY MOUTH EVERY DAY AT BEDTIME AS NEEDED FOR SLEEP          Objective:    BP 112/65   Pulse 95   Ht 5\' 3"  (1.6 m)   Wt 103 lb 12.8 oz (47.1 kg)   BMI 18.39 kg/m   No Known Allergies  Wt Readings from  Last 3 Encounters:  10/13/17 103 lb 12.8 oz (47.1 kg)  10/05/17 100 lb (45.4 kg)  08/18/17 106 lb (48.1 kg)    Physical Exam  Constitutional: She is oriented to person, place, and time. She appears well-developed and well-nourished. She is cooperative. She has a sickly appearance.  HENT:  Head: Normocephalic and atraumatic.  Eyes: Pupils are equal, round, and reactive to light. Conjunctivae and EOM are normal.  Cardiovascular: Normal rate, regular rhythm, normal heart sounds and intact distal pulses.  Pulmonary/Chest: Effort normal and breath sounds normal.  Abdominal: Soft. Bowel sounds are normal.  Neurological: She is alert and oriented to person, place, and time. She has normal reflexes.  Skin: Skin is warm and dry. No rash noted.  Psychiatric: She has a normal mood and affect. Her behavior is normal. Judgment and thought content normal.    Results for orders placed or performed during the hospital encounter of 10/05/17  CSF culture  Result Value Ref Range   Specimen Description CSF TUBE 2    Special Requests NONE    Gram Stain      WBC PRESENT,BOTH PMN AND MONONUCLEAR NO ORGANISMS SEEN CYTOSPIN SMEAR    Culture      NO GROWTH 3 DAYS Performed at [facility], [address] [place], [place] [address]    Report Status 10/08/2017 FINAL   Comprehensive metabolic panel  Result Value Ref Range   Sodium 131 (L) 135 - 145 mmol/L   Potassium 4.0 3.5 - 5.1 mmol/L   Chloride 95 (L) 98 - 111 mmol/L   CO2 19 (L) 22 - 32 mmol/L   Glucose, Bld 162 (H) 70 - 99 mg/dL   BUN 7 6 - 20 mg/dL   Creatinine, Ser 6.04 0.44 - 1.00 mg/dL   Calcium 9.1 8.9 - 54.0 mg/dL   Total Protein 8.8 (H) 6.5 - 8.1 g/dL   Albumin 4.6 3.5 - 5.0 g/dL   AST 58 (H) 15 - 41 U/L   ALT 28 0 - 44 U/L   Alkaline Phosphatase 63 38 - 126 U/L   Total Bilirubin 1.2 0.3 - 1.2 mg/dL   GFR calc non Af Amer >60 >60 mL/min   GFR calc Af Amer >60 >60 mL/min   Anion gap 17 (H) 5 - 15  Ethanol  Result Value Ref  Range   Alcohol, Ethyl (B) <10 <10 mg/dL  CBC with Differential  Result Value Ref Range   WBC 9.0 4.0 - 10.5 K/uL   RBC  4.58 3.87 - 5.11 MIL/uL   Hemoglobin 15.1 (H) 12.0 - 15.0 g/dL   HCT 16.143.4 09.636.0 - 04.546.0 %   MCV 94.8 78.0 - 100.0 fL   MCH 33.0 26.0 - 34.0 pg   MCHC 34.8 30.0 - 36.0 g/dL   RDW 40.919.4 (H) 81.111.5 - 91.415.5 %   Platelets 172 150 - 400 K/uL   Neutrophils Relative % 86 %   Neutro Abs 7.7 1.7 - 7.7 K/uL   Lymphocytes Relative 9 %   Lymphs Abs 0.8 0.7 - 4.0 K/uL   Monocytes Relative 5 %   Monocytes Absolute 0.4 0.1 - 1.0 K/uL   Eosinophils Relative 0 %   Eosinophils Absolute 0.0 0.0 - 0.7 K/uL   Basophils Relative 0 %   Basophils Absolute 0.0 0.0 - 0.1 K/uL  Urinalysis, Routine w reflex microscopic  Result Value Ref Range   Color, Urine YELLOW YELLOW   APPearance CLEAR CLEAR   Specific Gravity, Urine 1.012 1.005 - 1.030   pH 6.0 5.0 - 8.0   Glucose, UA 50 (A) NEGATIVE mg/dL   Hgb urine dipstick SMALL (A) NEGATIVE   Bilirubin Urine NEGATIVE NEGATIVE   Ketones, ur NEGATIVE NEGATIVE mg/dL   Protein, ur >=782>=300 (A) NEGATIVE mg/dL   Nitrite NEGATIVE NEGATIVE   Leukocytes, UA NEGATIVE NEGATIVE   RBC / HPF 0-5 0 - 5 RBC/hpf   WBC, UA 0-5 0 - 5 WBC/hpf   Bacteria, UA NONE SEEN NONE SEEN   Mucus PRESENT    Hyaline Casts, UA PRESENT   Urine rapid drug screen (hosp performed)  Result Value Ref Range   Opiates POSITIVE (A) NONE DETECTED   Cocaine NONE DETECTED NONE DETECTED   Benzodiazepines POSITIVE (A) NONE DETECTED   Amphetamines NONE DETECTED NONE DETECTED   Tetrahydrocannabinol NONE DETECTED NONE DETECTED   Barbiturates (A) NONE DETECTED    Result not available. Reagent lot number recalled by manufacturer.  TSH  Result Value Ref Range   TSH 1.691 0.350 - 4.500 uIU/mL  HIV antibody (Routine Testing)  Result Value Ref Range   HIV Screen 4th Generation wRfx Non Reactive Non Reactive  CBC  Result Value Ref Range   WBC 7.7 4.0 - 10.5 K/uL   RBC 3.75 (L) 3.87 - 5.11  MIL/uL   Hemoglobin 12.3 12.0 - 15.0 g/dL   HCT 95.634.2 (L) 21.336.0 - 08.646.0 %   MCV 91.2 78.0 - 100.0 fL   MCH 32.8 26.0 - 34.0 pg   MCHC 36.0 30.0 - 36.0 g/dL   RDW 57.818.2 (H) 46.911.5 - 62.915.5 %   Platelets 162 150 - 400 K/uL  Creatinine, serum  Result Value Ref Range   Creatinine, Ser 0.72 0.44 - 1.00 mg/dL   GFR calc non Af Amer >60 >60 mL/min   GFR calc Af Amer >60 >60 mL/min  Phosphorus  Result Value Ref Range   Phosphorus 3.4 2.5 - 4.6 mg/dL  Magnesium  Result Value Ref Range   Magnesium 1.3 (L) 1.7 - 2.4 mg/dL  Lactic acid, plasma  Result Value Ref Range   Lactic Acid, Venous 3.4 (HH) 0.5 - 1.9 mmol/L  Vitamin B1  Result Value Ref Range   Vitamin B1 (Thiamine) 225.7 (H) 66.5 - 200.0 nmol/L  Vitamin B12  Result Value Ref Range   Vitamin B-12 301 180 - 914 pg/mL  Lactic acid, plasma  Result Value Ref Range   Lactic Acid, Venous 3.1 (HH) 0.5 - 1.9 mmol/L  Hepatitis panel, acute  Result Value Ref Range  Hepatitis B Surface Ag Negative Negative   HCV Ab <0.1 0.0 - 0.9 s/co ratio   Hep A IgM Negative Negative   Hep B C IgM Negative Negative  Ammonia  Result Value Ref Range   Ammonia 52 (H) 9 - 35 umol/L  CBC  Result Value Ref Range   WBC 6.2 4.0 - 10.5 K/uL   RBC 4.13 3.87 - 5.11 MIL/uL   Hemoglobin 13.3 12.0 - 15.0 g/dL   HCT 16.1 09.6 - 04.5 %   MCV 90.1 78.0 - 100.0 fL   MCH 32.2 26.0 - 34.0 pg   MCHC 35.8 30.0 - 36.0 g/dL   RDW 40.9 (H) 81.1 - 91.4 %   Platelets 177 150 - 400 K/uL  Basic metabolic panel  Result Value Ref Range   Sodium 134 (L) 135 - 145 mmol/L   Potassium 3.0 (L) 3.5 - 5.1 mmol/L   Chloride 99 98 - 111 mmol/L   CO2 19 (L) 22 - 32 mmol/L   Glucose, Bld 97 70 - 99 mg/dL   BUN 5 (L) 6 - 20 mg/dL   Creatinine, Ser 7.82 0.44 - 1.00 mg/dL   Calcium 8.0 (L) 8.9 - 10.3 mg/dL   GFR calc non Af Amer >60 >60 mL/min   GFR calc Af Amer >60 >60 mL/min   Anion gap 16 (H) 5 - 15  Varicella-zoster by PCR  Result Value Ref Range   Source Varicella-Zoster, PCR  CSF    Varicella-Zoster, PCR Negative Negative  CSF cell count with differential collection tube #: 1  Result Value Ref Range   Tube # 1    Color, CSF COLORLESS COLORLESS   Appearance, CSF CLEAR CLEAR   Supernatant NOT INDICATED    RBC Count, CSF 1 (H) 0 /cu mm   WBC, CSF 2 0 - 5 /cu mm   Other Cells, CSF TOO FEW TO COUNT, SMEAR AVAILABLE FOR REVIEW   Protein and glucose, CSF  Result Value Ref Range   Glucose, CSF 78 (H) 40 - 70 mg/dL   Total  Protein, CSF 36 15 - 45 mg/dL  Herpes simplex virus (HSV), DNA by PCR Cerebrospinal Fluid  Result Value Ref Range   HSV 1 DNA Negative Negative   HSV 2 DNA Negative Negative  Comprehensive metabolic panel  Result Value Ref Range   Sodium 135 135 - 145 mmol/L   Potassium 2.9 (L) 3.5 - 5.1 mmol/L   Chloride 105 98 - 111 mmol/L   CO2 20 (L) 22 - 32 mmol/L   Glucose, Bld 79 70 - 99 mg/dL   BUN <5 (L) 6 - 20 mg/dL   Creatinine, Ser 9.56 0.44 - 1.00 mg/dL   Calcium 7.9 (L) 8.9 - 10.3 mg/dL   Total Protein 6.7 6.5 - 8.1 g/dL   Albumin 3.6 3.5 - 5.0 g/dL   AST 64 (H) 15 - 41 U/L   ALT 31 0 - 44 U/L   Alkaline Phosphatase 40 38 - 126 U/L   Total Bilirubin 1.3 (H) 0.3 - 1.2 mg/dL   GFR calc non Af Amer >60 >60 mL/min   GFR calc Af Amer >60 >60 mL/min   Anion gap 10 5 - 15  CBC  Result Value Ref Range   WBC 5.4 4.0 - 10.5 K/uL   RBC 3.62 (L) 3.87 - 5.11 MIL/uL   Hemoglobin 11.6 (L) 12.0 - 15.0 g/dL   HCT 21.3 (L) 08.6 - 57.8 %   MCV 93.6 78.0 - 100.0 fL  MCH 32.0 26.0 - 34.0 pg   MCHC 34.2 30.0 - 36.0 g/dL   RDW 63.8 (H) 75.6 - 43.3 %   Platelets 177 150 - 400 K/uL  Magnesium  Result Value Ref Range   Magnesium 1.9 1.7 - 2.4 mg/dL  Phosphorus  Result Value Ref Range   Phosphorus 2.8 2.5 - 4.6 mg/dL  Basic metabolic panel  Result Value Ref Range   Sodium 133 (L) 135 - 145 mmol/L   Potassium 3.1 (L) 3.5 - 5.1 mmol/L   Chloride 103 98 - 111 mmol/L   CO2 22 22 - 32 mmol/L   Glucose, Bld 96 70 - 99 mg/dL   BUN <5 (L) 6 - 20 mg/dL     Creatinine, Ser 2.95 0.44 - 1.00 mg/dL   Calcium 7.8 (L) 8.9 - 10.3 mg/dL   GFR calc non Af Amer >60 >60 mL/min   GFR calc Af Amer >60 >60 mL/min   Anion gap 8 5 - 15  CBC  Result Value Ref Range   WBC 5.7 4.0 - 10.5 K/uL   RBC 3.46 (L) 3.87 - 5.11 MIL/uL   Hemoglobin 11.1 (L) 12.0 - 15.0 g/dL   HCT 18.8 (L) 41.6 - 60.6 %   MCV 92.2 78.0 - 100.0 fL   MCH 32.1 26.0 - 34.0 pg   MCHC 34.8 30.0 - 36.0 g/dL   RDW 30.1 (H) 60.1 - 09.3 %   Platelets 194 150 - 400 K/uL  Phosphorus  Result Value Ref Range   Phosphorus 2.6 2.5 - 4.6 mg/dL  Magnesium  Result Value Ref Range   Magnesium 1.8 1.7 - 2.4 mg/dL  Basic metabolic panel  Result Value Ref Range   Sodium 134 (L) 135 - 145 mmol/L   Potassium 3.0 (L) 3.5 - 5.1 mmol/L   Chloride 109 98 - 111 mmol/L   CO2 21 (L) 22 - 32 mmol/L   Glucose, Bld 96 70 - 99 mg/dL   BUN <5 (L) 6 - 20 mg/dL   Creatinine, Ser 2.35 0.44 - 1.00 mg/dL   Calcium 7.7 (L) 8.9 - 10.3 mg/dL   GFR calc non Af Amer >60 >60 mL/min   GFR calc Af Amer >60 >60 mL/min   Anion gap 4 (L) 5 - 15  Basic metabolic panel  Result Value Ref Range   Sodium 136 135 - 145 mmol/L   Potassium 3.4 (L) 3.5 - 5.1 mmol/L   Chloride 111 98 - 111 mmol/L   CO2 19 (L) 22 - 32 mmol/L   Glucose, Bld 102 (H) 70 - 99 mg/dL   BUN <5 (L) 6 - 20 mg/dL   Creatinine, Ser 5.73 0.44 - 1.00 mg/dL   Calcium 7.7 (L) 8.9 - 10.3 mg/dL   GFR calc non Af Amer >60 >60 mL/min   GFR calc Af Amer >60 >60 mL/min   Anion gap 6 5 - 15  CBG monitoring, ED  Result Value Ref Range   Glucose-Capillary 175 (H) 70 - 99 mg/dL      Assessment & Plan:   1. Moderate episode of recurrent major depressive disorder (HCC) - ALPRAZolam (XANAX) 0.5 MG tablet; Take 1 Tablet by mouth 2 times a day as needed  Dispense: 60 tablet; Refill: 0  2. DDD (degenerative disc disease), lumbar - HYDROcodone-acetaminophen (NORCO) 10-325 MG tablet; Take 1 tablet by mouth every 8 (eight) hours.  Dispense: 60 tablet; Refill:  0   Continue all other maintenance medications as listed above.  Follow up plan:  Return for keep Aug appt.  Educational handout given for survey  Remus Loffler PA-C [facility] [address]  [place], [place] [address] [phone]   10/14/2017, 10:19 AM

## 2017-10-15 ENCOUNTER — Telehealth: Payer: Self-pay | Admitting: Physician Assistant

## 2017-10-15 NOTE — Telephone Encounter (Signed)
[facility]. Please advise if something can be sent in for pain.

## 2017-10-15 NOTE — Telephone Encounter (Signed)
Patient aware that daughter has rx for pain medication.

## 2017-10-15 NOTE — Telephone Encounter (Signed)
I gave her daughter a script for limited pain medication, just to be taken as needed for severe pain.  She needs to speak with her daughter.

## 2017-10-27 ENCOUNTER — Ambulatory Visit (INDEPENDENT_AMBULATORY_CARE_PROVIDER_SITE_OTHER): Payer: Medicare Other

## 2017-10-27 DIAGNOSIS — D530 Protein deficiency anemia: Secondary | ICD-10-CM

## 2017-10-27 DIAGNOSIS — G8384 Todd's paralysis (postepileptic): Secondary | ICD-10-CM | POA: Diagnosis not present

## 2017-10-27 DIAGNOSIS — I10 Essential (primary) hypertension: Secondary | ICD-10-CM

## 2017-10-27 DIAGNOSIS — Z79891 Long term (current) use of opiate analgesic: Secondary | ICD-10-CM

## 2017-10-27 DIAGNOSIS — F440 Dissociative amnesia: Secondary | ICD-10-CM

## 2017-10-27 DIAGNOSIS — F329 Major depressive disorder, single episode, unspecified: Secondary | ICD-10-CM

## 2017-10-27 DIAGNOSIS — F1010 Alcohol abuse, uncomplicated: Secondary | ICD-10-CM

## 2017-10-31 ENCOUNTER — Telehealth: Payer: Self-pay | Admitting: Physician Assistant

## 2017-10-31 NOTE — Telephone Encounter (Signed)
Aware,  PCP will be back in office Monday. Daughter says when mother came home from hospital she was confused and took more than she should of the flexeril.  They are doing her medication now and she has run short on this medication.  They need authorization for an early refill.

## 2017-11-03 NOTE — Telephone Encounter (Signed)
Pt got rx early from a different [facility].

## 2017-11-03 NOTE — Telephone Encounter (Signed)
That is okay one time

## 2017-11-04 ENCOUNTER — Telehealth: Payer: Self-pay | Admitting: Physician Assistant

## 2017-11-05 ENCOUNTER — Other Ambulatory Visit: Payer: Self-pay | Admitting: *Deleted

## 2017-11-05 DIAGNOSIS — R410 Disorientation, unspecified: Secondary | ICD-10-CM

## 2017-11-05 NOTE — Telephone Encounter (Signed)
[facility] home [facility] aware

## 2017-11-05 NOTE — Telephone Encounter (Signed)
Can perform CBC, CMP, U/A

## 2017-11-06 ENCOUNTER — Telehealth: Payer: Self-pay | Admitting: Physician Assistant

## 2017-11-06 DIAGNOSIS — I10 Essential (primary) hypertension: Secondary | ICD-10-CM | POA: Diagnosis not present

## 2017-11-06 DIAGNOSIS — D649 Anemia, unspecified: Secondary | ICD-10-CM | POA: Diagnosis not present

## 2017-11-06 MED ORDER — FOLIC ACID 1 MG PO TABS: 1.0000 mg | ORAL_TABLET | Freq: Every day | ORAL | 0 refills | Status: DC

## 2017-11-06 MED ORDER — METOPROLOL TARTRATE 25 MG PO TABS: 25.0000 mg | ORAL_TABLET | Freq: Two times a day (BID) | ORAL | 0 refills | Status: DC

## 2017-11-06 NOTE — Telephone Encounter (Signed)
Refills sent over for both Metoprolol and Folic Acid and advised pt's daughter she needs [facility].

## 2017-11-11 ENCOUNTER — Telehealth: Payer: Self-pay | Admitting: Physician Assistant

## 2017-11-11 ENCOUNTER — Ambulatory Visit (INDEPENDENT_AMBULATORY_CARE_PROVIDER_SITE_OTHER): Payer: Medicare Other | Admitting: Physician Assistant

## 2017-11-11 ENCOUNTER — Encounter: Payer: Self-pay | Admitting: Physician Assistant

## 2017-11-11 DIAGNOSIS — F331 Major depressive disorder, recurrent, moderate: Secondary | ICD-10-CM | POA: Diagnosis not present

## 2017-11-11 DIAGNOSIS — M5136 Other intervertebral disc degeneration, lumbar region: Secondary | ICD-10-CM | POA: Diagnosis not present

## 2017-11-11 DIAGNOSIS — F5101 Primary insomnia: Secondary | ICD-10-CM

## 2017-11-11 MED ORDER — MELOXICAM 7.5 MG PO TABS: 7.5000 mg | ORAL_TABLET | Freq: Every day | ORAL | 11 refills | Status: DC

## 2017-11-11 MED ORDER — HYDROCODONE-ACETAMINOPHEN 10-325 MG PO TABS: 1.0000 | ORAL_TABLET | Freq: Three times a day (TID) | ORAL | 0 refills | Status: DC

## 2017-11-11 MED ORDER — TRAZODONE HCL 300 MG PO TABS: 300.0000 mg | ORAL_TABLET | Freq: Every day | ORAL | 11 refills | Status: DC

## 2017-11-11 MED ORDER — HYDROCODONE-ACETAMINOPHEN 10-325 MG PO TABS: 1.0000 | ORAL_TABLET | Freq: Three times a day (TID) | ORAL | 0 refills | Status: DC | PRN

## 2017-11-11 MED ORDER — GABAPENTIN 100 MG PO CAPS: 100.0000 mg | ORAL_CAPSULE | Freq: Every day | ORAL | 3 refills | Status: DC

## 2017-11-11 MED ORDER — ALPRAZOLAM 0.5 MG PO TABS: ORAL_TABLET | ORAL | 2 refills | Status: DC

## 2017-11-11 NOTE — Telephone Encounter (Signed)
Pharmacy notified ok to dispense

## 2017-11-11 NOTE — Telephone Encounter (Signed)
It is okay to dispense

## 2017-11-12 NOTE — Progress Notes (Signed)
This patient comes in for 1 month recheck on her pain medicine and anxiety medicine.  There was a recent hospitalization where she had taken too much of her medications.  At this point we have greatly lowered her medications she is taking Xanax twice daily on a routine basis.  Her daughter is present in the room and she controls the medications.  Her pain medicine is taken 3 times a day.  She does ask a couple of times about having more pain medicine.  And we have discussed that there was a misuse of the medication and we have to keep them as low as possible.  She does not remember if she is ever taking gabapentin.  And we are going to try this for the neuropathic pain she has from her back.  Her daughter does note that the trazodone at bedtime does seem to be helping her sleep some.  We will adjust this medication to the next dose.    BP (!) 170/91   Pulse 88   Temp 98.4 F (36.9 C) (Oral)   Ht 5\' 3"  (1.6 m)   Wt 112 lb (50.8 kg)   BMI 19.84 kg/m    Subjective:    Patient ID: Betty Nelson, female    DOB: 23-May-1960, 57 y.o.   MRN: [id number]  HPI: Betty Nelson is a 56 y.o. female presenting on 11/11/2017 for Depression and Pain medication refill    Past Medical History:  Diagnosis Date  . Anxiety   . Bipolar disorder (HCC)   . Chronic lower back pain   . DDD (degenerative disc disease), lumbar   . Depression   . Disability of walking   . Headache    "about q wk" (10/09/2017)  . Hypertension   . Seizure (HCC) 10/05/2017   "that was my 1st" (10/09/2017)  . 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 Interim medical history since our last visit reviewed. Allergies and medications reviewed and updated. DATA REVIEWED: CHART IN EPIC  Family History reviewed for pertinent findings.  Review of Systems  Constitutional: Negative.  Negative for activity change, fatigue and fever.  HENT: Negative.    Eyes: Negative.   Respiratory: Negative.  Negative for cough.   Cardiovascular: Negative.  Negative for chest pain.  Gastrointestinal: Negative.  Negative for abdominal pain.  Endocrine: Negative.   Genitourinary: Negative.  Negative for dysuria.  Musculoskeletal: Negative.   Skin: Negative.   Neurological: Negative.     Allergies as of 11/11/2017   No Known Allergies     Medication List        Accurate as of 11/11/17 11:59 PM. Always use your most recent med list.          ALPRAZolam 0.5 MG tablet Commonly known as:  XANAX Take 1 Tablet by mouth 2 times a day as needed   citalopram 40 MG tablet Commonly known as:  CELEXA Take 1 tablet (40 mg total) by mouth daily.   cyclobenzaprine 10 MG tablet Commonly known as:  FLEXERIL Take 1 tablet (10 mg total) by mouth 2 (two) times daily.   feeding supplement (ENSURE ENLIVE) Liqd Take 237 mLs by mouth 2 (two) times daily between meals.   folic acid 1 MG tablet Commonly known as:  FOLVITE Take 1 tablet (1 mg total) by mouth daily.   gabapentin 100 MG capsule Commonly known as:  NEURONTIN Take 1-3 capsules (100-300 mg total) by  mouth at bedtime.   HYDROcodone-acetaminophen 10-325 MG tablet Commonly known as:  NORCO Take 1 tablet by mouth every 8 (eight) hours.   HYDROcodone-acetaminophen 10-325 MG tablet Commonly known as:  NORCO Take 1 tablet by mouth every 8 (eight) hours as needed.   loratadine 10 MG tablet Commonly known as:  CLARITIN Take 1 tablet (10 mg total) by mouth daily.   megestrol 40 MG tablet Commonly known as:  MEGACE TAKE 1 TABLET BY MOUTH 2 TIMES A DAY   meloxicam 7.5 MG tablet Commonly known as:  MOBIC Take 1 tablet (7.5 mg total) by mouth daily.   metoprolol tartrate 25 MG tablet Commonly known as:  LOPRESSOR Take 1 tablet (25 mg total) by mouth 2 (two) times daily.   omeprazole 20 MG capsule Commonly known as:  PRILOSEC Take 1 capsule (20 mg total) by mouth daily.   thiamine 100 MG  tablet Commonly known as:  VITAMIN B-1 Take 1 tablet (100 mg total) by mouth daily.   trazodone 300 MG tablet Commonly known as:  DESYREL Take 1 tablet (300 mg total) by mouth at bedtime.          Objective:    BP (!) 170/91   Pulse 88   Temp 98.4 F (36.9 C) (Oral)   Ht 5\' 3"  (1.6 m)   Wt 112 lb (50.8 kg)   BMI 19.84 kg/m   No Known Allergies  Wt Readings from Last 3 Encounters:  11/11/17 112 lb (50.8 kg)  10/13/17 103 lb 12.8 oz (47.1 kg)  10/05/17 100 lb (45.4 kg)    Physical Exam  Constitutional: She is oriented to person, place, and time. She appears well-developed. She has a sickly appearance. No distress.  HENT:  Head: Normocephalic and atraumatic.  Eyes: Pupils are equal, round, and reactive to light. Conjunctivae and EOM are normal.  Cardiovascular: Normal rate, regular rhythm, normal heart sounds and intact distal pulses.  Pulmonary/Chest: Effort normal and breath sounds normal.  Abdominal: Soft. Bowel sounds are normal.  Neurological: She is alert and oriented to person, place, and time. She has normal reflexes.  Skin: Skin is warm and dry. No rash noted.  Psychiatric: She has a normal mood and affect. Her behavior is normal. Judgment and thought content normal.        Assessment & Plan:   1. DDD (degenerative disc disease), lumbar - HYDROcodone-acetaminophen (NORCO) 10-325 MG tablet; Take 1 tablet by mouth every 8 (eight) hours.  Dispense: 90 tablet; Refill: 0  2. Moderate episode of recurrent major depressive disorder (HCC) - ALPRAZolam (XANAX) 0.5 MG tablet; Take 1 Tablet by mouth 2 times a day as needed  Dispense: 60 tablet; Refill: 2  3. Primary insomnia - traZODone (DESYREL) 300 MG tablet; Take 1 tablet (300 mg total) by mouth at bedtime.  Dispense: 30 tablet; Refill: 11   Continue all other maintenance medications as listed above.  Follow up plan: Return in about 2 months (around 01/11/2018) for recheck.  Educational handout given for  survey  Remus LofflerAngel S. Kirstyn Lean PA-C [facility] [address]  [place], [place] [address] [phone]   11/12/2017, 8:51 AM

## 2017-11-25 ENCOUNTER — Telehealth: Payer: Self-pay | Admitting: Physician Assistant

## 2017-11-26 NOTE — Telephone Encounter (Signed)
[facility] Pt stable at home visit on 11/25/2017 per Star

## 2017-12-03 ENCOUNTER — Other Ambulatory Visit: Payer: Self-pay | Admitting: Physician Assistant

## 2018-01-05 ENCOUNTER — Telehealth: Payer: Self-pay | Admitting: Physician Assistant

## 2018-01-05 ENCOUNTER — Other Ambulatory Visit: Payer: Self-pay | Admitting: Physician Assistant

## 2018-01-05 MED ORDER — METOPROLOL TARTRATE 25 MG PO TABS: 25.0000 mg | ORAL_TABLET | Freq: Two times a day (BID) | ORAL | 3 refills | Status: DC

## 2018-01-05 MED ORDER — FOLIC ACID 1 MG PO TABS: 1.0000 mg | ORAL_TABLET | Freq: Every day | ORAL | 3 refills | Status: DC

## 2018-01-05 NOTE — Telephone Encounter (Signed)
Pharmacy last had filled on 12/09/2017. She states she is completely out.

## 2018-01-05 NOTE — Telephone Encounter (Signed)
Refill failed, resent 

## 2018-01-05 NOTE — Telephone Encounter (Signed)
Patient aware that she must be seen

## 2018-01-05 NOTE — Telephone Encounter (Signed)
At our last visit, we discussed hydrocodone 1-3 times a day #90 given per month would be what we did.  She was given a prescription for 11/11/17 and then another one for 30 days later.  She should not be out now.  She will have to wait until her follow-up appointment.

## 2018-01-05 NOTE — Addendum Note (Signed)
Addended by: Julious Payer D on: 01/05/2018 10:54 AM   Modules accepted: Orders

## 2018-01-05 NOTE — Telephone Encounter (Signed)
PT has called back

## 2018-01-13 ENCOUNTER — Ambulatory Visit (INDEPENDENT_AMBULATORY_CARE_PROVIDER_SITE_OTHER): Payer: Medicare Other | Admitting: Physician Assistant

## 2018-01-13 ENCOUNTER — Encounter: Payer: Self-pay | Admitting: Physician Assistant

## 2018-01-13 DIAGNOSIS — F331 Major depressive disorder, recurrent, moderate: Secondary | ICD-10-CM

## 2018-01-13 DIAGNOSIS — M5136 Other intervertebral disc degeneration, lumbar region: Secondary | ICD-10-CM | POA: Diagnosis not present

## 2018-01-13 MED ORDER — HYDROCODONE-ACETAMINOPHEN 10-325 MG PO TABS: 1.0000 | ORAL_TABLET | Freq: Three times a day (TID) | ORAL | 0 refills | Status: DC

## 2018-01-13 MED ORDER — HYDROCODONE-ACETAMINOPHEN 10-325 MG PO TABS: 1.0000 | ORAL_TABLET | Freq: Three times a day (TID) | ORAL | 0 refills | Status: DC | PRN

## 2018-01-13 MED ORDER — ALPRAZOLAM 0.5 MG PO TABS: ORAL_TABLET | ORAL | 5 refills | Status: DC

## 2018-01-13 NOTE — Progress Notes (Signed)
BP 137/89   Pulse (!) 112   Ht 5\' 3"  (1.6 m)   Wt 121 lb 3.2 oz (55 kg)   BMI 21.47 kg/m    Subjective:    Patient ID: Betty Nelson, female    DOB: 30-Oct-1960, 57 y.o.   MRN: [id number]  HPI: Betty Nelson is a 57 y.o. female presenting on 01/13/2018 for Hypertension and Pain (2 month follow up )  Patient has brought in by her daughter.  She is doing very well at this point with her medications.  The daughter is controlling her medications.  She is only taking 3 hydrocodone per day and Xanax 1 to 2/day.  The prescriptions will be printed.  [facility] [place].  She has gained 9 pounds since her last visit and almost 20 pounds since her lowest weight in July.  She is doing a very good job taking care of herself now.  She still occasionally drinks some beer, she uses one that is very dilute.  She does not have any other complaints at this time.  The patient is not able to drive due to her medical conditions and does have difficulty keeping up with her activities of daily living.  Her daughter prevents lives there and helps take care of her and all of this form.  They are going to get in touch with their social worker and see if there is any way to have her hired as a Financial controller.  I think this would be appropriate and will serve as a face-to-face contact for home health.  Past Medical History:  Diagnosis Date  . Anxiety   . Bipolar disorder (HCC)   . Chronic lower back pain   . DDD (degenerative disc disease), lumbar   . Depression   . Disability of walking   . Headache    "about q wk" (10/09/2017)  . Hypertension   . Seizure (HCC) 10/05/2017   "that was my 1st" (10/09/2017)  . 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 Interim medical history since our last visit reviewed. Allergies and medications reviewed and updated. DATA REVIEWED: CHART IN EPIC  Family History reviewed for pertinent  findings.  Review of Systems  Constitutional: Negative.   HENT: Negative.   Eyes: Negative.   Respiratory: Negative.   Gastrointestinal: Negative.   Genitourinary: Negative.   Musculoskeletal: Positive for arthralgias, joint swelling and myalgias.    Allergies as of 01/13/2018   No Known Allergies     Medication List        Accurate as of 01/13/18  2:35 PM. Always use your most recent med list.          ALPRAZolam 0.5 MG tablet Commonly known as:  XANAX Take 1 Tablet by mouth 2 times a day as needed   citalopram 40 MG tablet Commonly known as:  CELEXA Take 1 tablet (40 mg total) by mouth daily.   cyclobenzaprine 10 MG tablet Commonly known as:  FLEXERIL Take 1 tablet (10 mg total) by mouth 2 (two) times daily.   feeding supplement (ENSURE ENLIVE) Liqd Take 237 mLs by mouth 2 (two) times daily between meals.   folic acid 1 MG tablet Commonly known as:  FOLVITE Take 1 tablet (1 mg total) by mouth daily.   gabapentin 100 MG capsule Commonly known as:  NEURONTIN Take 1-3 capsules (100-300 mg total) by mouth at bedtime.   HYDROcodone-acetaminophen 10-325 MG  tablet Commonly known as:  NORCO Take 1 tablet by mouth every 8 (eight) hours.   HYDROcodone-acetaminophen 10-325 MG tablet Commonly known as:  NORCO Take 1 tablet by mouth every 8 (eight) hours as needed.   HYDROcodone-acetaminophen 10-325 MG tablet Commonly known as:  NORCO Take 1 tablet by mouth every 8 (eight) hours as needed.   loratadine 10 MG tablet Commonly known as:  CLARITIN Take 1 tablet (10 mg total) by mouth daily.   megestrol 40 MG tablet Commonly known as:  MEGACE TAKE 1 TABLET BY MOUTH TWICE A DAY   meloxicam 7.5 MG tablet Commonly known as:  MOBIC Take 1 tablet (7.5 mg total) by mouth daily.   metoprolol tartrate 25 MG tablet Commonly known as:  LOPRESSOR Take 1 tablet (25 mg total) by mouth 2 (two) times daily.   omeprazole 20 MG capsule Commonly known as:  PRILOSEC Take 1  capsule (20 mg total) by mouth daily.   thiamine 100 MG tablet Commonly known as:  VITAMIN B-1 Take 1 tablet (100 mg total) by mouth daily.   trazodone 300 MG tablet Commonly known as:  DESYREL Take 1 tablet (300 mg total) by mouth at bedtime.          Objective:    BP 137/89   Pulse (!) 112   Ht 5\' 3"  (1.6 m)   Wt 121 lb 3.2 oz (55 kg)   BMI 21.47 kg/m   No Known Allergies  Wt Readings from Last 3 Encounters:  01/13/18 121 lb 3.2 oz (55 kg)  11/11/17 112 lb (50.8 kg)  10/13/17 103 lb 12.8 oz (47.1 kg)    Physical Exam  Constitutional: She is oriented to person, place, and time. She appears well-developed and well-nourished.  HENT:  Head: Normocephalic and atraumatic.  Eyes: Pupils are equal, round, and reactive to light. Conjunctivae and EOM are normal.  Cardiovascular: Normal rate, regular rhythm, normal heart sounds and intact distal pulses.  Pulmonary/Chest: Effort normal and breath sounds normal.  Abdominal: Soft. Bowel sounds are normal.  Neurological: She is alert and oriented to person, place, and time. She has normal reflexes.  Skin: Skin is warm and dry. No rash noted.  Psychiatric: She has a normal mood and affect. Her behavior is normal. Judgment and thought content normal.        Assessment & Plan:   1. DDD (degenerative disc disease), lumbar - HYDROcodone-acetaminophen (NORCO) 10-325 MG tablet; Take 1 tablet by mouth every 8 (eight) hours.  Dispense: 90 tablet; Refill: 0  2. Moderate episode of recurrent major depressive disorder (HCC) - ALPRAZolam (XANAX) 0.5 MG tablet; Take 1 Tablet by mouth 2 times a day as needed  Dispense: 60 tablet; Refill: 5   Continue all other maintenance medications as listed above.  Follow up plan: Return in about 12 weeks (around 04/07/2018) for recheck.  Educational handout given for survey  Remus Loffler PA-C [facility] [address]  [place], [place]  [address] [phone]   01/13/2018, 2:35 PM

## 2018-03-09 ENCOUNTER — Other Ambulatory Visit: Payer: Self-pay | Admitting: Physician Assistant

## 2018-03-09 DIAGNOSIS — M5136 Other intervertebral disc degeneration, lumbar region: Secondary | ICD-10-CM

## 2018-03-09 NOTE — Telephone Encounter (Signed)
Last seen 01/13/18  Betty Nelson

## 2018-03-20 ENCOUNTER — Other Ambulatory Visit: Payer: Self-pay | Admitting: Physician Assistant

## 2018-03-20 DIAGNOSIS — F331 Major depressive disorder, recurrent, moderate: Secondary | ICD-10-CM

## 2018-03-20 NOTE — Telephone Encounter (Signed)
Needs to be seen

## 2018-03-20 NOTE — Telephone Encounter (Signed)
lmtcb to schedule appt 

## 2018-04-21 ENCOUNTER — Other Ambulatory Visit: Payer: Self-pay | Admitting: Physician Assistant

## 2018-04-21 ENCOUNTER — Ambulatory Visit (INDEPENDENT_AMBULATORY_CARE_PROVIDER_SITE_OTHER): Payer: Medicare Other | Admitting: Physician Assistant

## 2018-04-21 ENCOUNTER — Encounter: Payer: Self-pay | Admitting: Physician Assistant

## 2018-04-21 DIAGNOSIS — M5136 Other intervertebral disc degeneration, lumbar region: Secondary | ICD-10-CM

## 2018-04-21 DIAGNOSIS — F331 Major depressive disorder, recurrent, moderate: Secondary | ICD-10-CM

## 2018-04-21 MED ORDER — HYDROCODONE-ACETAMINOPHEN 10-325 MG PO TABS: 1.0000 | ORAL_TABLET | Freq: Four times a day (QID) | ORAL | 0 refills | Status: DC | PRN

## 2018-04-21 MED ORDER — ALPRAZOLAM 0.25 MG PO TABS: ORAL_TABLET | ORAL | 5 refills | Status: DC

## 2018-04-21 MED ORDER — CITALOPRAM HYDROBROMIDE 40 MG PO TABS: 40.0000 mg | ORAL_TABLET | Freq: Every day | ORAL | 5 refills | Status: DC

## 2018-04-21 NOTE — Telephone Encounter (Signed)
Pt notified of RX Verbalizes understanding 

## 2018-04-21 NOTE — Telephone Encounter (Signed)
[facility] [place] the others can be filled at the pharmacy in [place] in the next 2 months.

## 2018-04-22 ENCOUNTER — Ambulatory Visit: Payer: Medicare Other | Admitting: Physician Assistant

## 2018-04-23 NOTE — Progress Notes (Signed)
BP 135/79   Pulse (!) 109   Temp 97.8 F (36.6 C) (Oral)   Ht 5\' 3"  (1.6 m)   Wt 123 lb (55.8 kg)   BMI 21.79 kg/m    Subjective:    Patient ID: Betty Nelson, female    DOB: [birth year], 58 y.o.   MRN: [id number]  HPI: Betty Nelson is a 58 y.o. female presenting on 04/21/2018 for Pain (3 month)  The patient comes in today for periodic follow-up on their medications and chronic conditions.  They are currently under a narcotic agreement with [place] for the following: chronic pain DDD  At this time the patient reports there are no new issues going on.  They are tolerating the medication well and it is controlling her symptoms very well.  The registry is reviewed and there are no red flags.  The refills are on time and appropriate.  The contract is discussed again.  We will progress with refilling the medications for this period of time.   Past Medical History:  Diagnosis Date  . Anxiety   . Bipolar disorder (HCC)   . Chronic lower back pain   . DDD (degenerative disc disease), lumbar   . Depression   . Disability of walking   . Headache    "about q wk" (10/09/2017)  . Hypertension   . Seizure (HCC) 10/05/2017   "that was my 1st" (10/09/2017)  . 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 Interim medical history since our last visit reviewed. Allergies and medications reviewed and updated. DATA REVIEWED: CHART IN EPIC  Family History reviewed for pertinent findings.  Review of Systems  Constitutional: Negative.   HENT: Negative.   Eyes: Negative.   Respiratory: Negative.   Gastrointestinal: Negative.   Genitourinary: Negative.     Allergies as of 04/21/2018   No Known Allergies     Medication List       Accurate as of April 21, 2018 11:59 PM. Always use your most recent med list.        ALPRAZolam 0.25 MG tablet Commonly known as:  XANAX Take 1 Tablet by mouth 2 times a day  as needed   citalopram 40 MG tablet Commonly known as:  CELEXA Take 1 tablet (40 mg total) by mouth daily.   cyclobenzaprine 10 MG tablet Commonly known as:  FLEXERIL TAKE 1 TABLET BY MOUTH TWICE A DAY   feeding supplement (ENSURE ENLIVE) Liqd Take 237 mLs by mouth 2 (two) times daily between meals.   folic acid 1 MG tablet Commonly known as:  FOLVITE Take 1 tablet (1 mg total) by mouth daily.   gabapentin 100 MG capsule Commonly known as:  NEURONTIN Take 1-3 capsules (100-300 mg total) by mouth at bedtime.   HYDROcodone-acetaminophen 10-325 MG tablet Commonly known as:  NORCO Take 1 tablet by mouth every 6 (six) hours as needed.   HYDROcodone-acetaminophen 10-325 MG tablet Commonly known as:  NORCO Take 1 tablet by mouth every 6 (six) hours as needed.   HYDROcodone-acetaminophen 10-325 MG tablet Commonly known as:  NORCO Take 1 tablet by mouth every 6 (six) hours as needed.   loratadine 10 MG tablet Commonly known as:  CLARITIN Take 1 tablet (10 mg total) by mouth daily.   megestrol 40 MG tablet Commonly known as:  MEGACE TAKE 1 TABLET BY MOUTH TWICE A DAY   meloxicam 7.5 MG tablet Commonly known as:  MOBIC  Take 1 tablet (7.5 mg total) by mouth daily.   metoprolol tartrate 25 MG tablet Commonly known as:  LOPRESSOR Take 1 tablet (25 mg total) by mouth 2 (two) times daily.   omeprazole 20 MG capsule Commonly known as:  PRILOSEC Take 1 capsule (20 mg total) by mouth daily.   thiamine 100 MG tablet Commonly known as:  VITAMIN B-1 Take 1 tablet (100 mg total) by mouth daily.   trazodone 300 MG tablet Commonly known as:  DESYREL Take 1 tablet (300 mg total) by mouth at bedtime.          Objective:    BP 135/79   Pulse (!) 109   Temp 97.8 F (36.6 C) (Oral)   Ht 5\' 3"  (1.6 m)   Wt 123 lb (55.8 kg)   BMI 21.79 kg/m   No Known Allergies  Wt Readings from Last 3 Encounters:  04/21/18 123 lb (55.8 kg)  01/13/18 121 lb 3.2 oz (55 kg)  11/11/17 112  lb (50.8 kg)    Physical Exam Constitutional:      Appearance: She is well-developed.  HENT:     Head: Normocephalic and atraumatic.  Eyes:     Conjunctiva/sclera: Conjunctivae normal.     Pupils: Pupils are equal, round, and reactive to light.  Cardiovascular:     Rate and Rhythm: Normal rate and regular rhythm.     Heart sounds: Normal heart sounds.  Pulmonary:     Effort: Pulmonary effort is normal.     Breath sounds: Normal breath sounds.  Abdominal:     General: Bowel sounds are normal.     Palpations: Abdomen is soft.  Skin:    General: Skin is warm and dry.     Findings: No rash.  Neurological:     Mental Status: She is alert and oriented to person, place, and time.     Deep Tendon Reflexes: Reflexes are normal and symmetric.  Psychiatric:        Behavior: Behavior normal.        Thought Content: Thought content normal.        Judgment: Judgment normal.     Results for orders placed or performed during the hospital encounter of 10/05/17  CSF culture  Result Value Ref Range   Specimen Description CSF TUBE 2    Special Requests NONE    Gram Stain      WBC PRESENT,BOTH PMN AND MONONUCLEAR NO ORGANISMS SEEN CYTOSPIN SMEAR    Culture      NO GROWTH 3 DAYS Performed at [facility], [address] [place], [place] [address]    Report Status 10/08/2017 FINAL   Comprehensive metabolic panel  Result Value Ref Range   Sodium 131 (L) 135 - 145 mmol/L   Potassium 4.0 3.5 - 5.1 mmol/L   Chloride 95 (L) 98 - 111 mmol/L   CO2 19 (L) 22 - 32 mmol/L   Glucose, Bld 162 (H) 70 - 99 mg/dL   BUN 7 6 - 20 mg/dL   Creatinine, Ser 6.04 0.44 - 1.00 mg/dL   Calcium 9.1 8.9 - 54.0 mg/dL   Total Protein 8.8 (H) 6.5 - 8.1 g/dL   Albumin 4.6 3.5 - 5.0 g/dL   AST 58 (H) 15 - 41 U/L   ALT 28 0 - 44 U/L   Alkaline Phosphatase 63 38 - 126 U/L   Total Bilirubin 1.2 0.3 - 1.2 mg/dL   GFR calc non Af Amer >60 >60 mL/min  GFR calc Af Amer >60 >60 mL/min   Anion gap 17 (H) 5  - 15  Ethanol  Result Value Ref Range   Alcohol, Ethyl (B) <10 <10 mg/dL  CBC with Differential  Result Value Ref Range   WBC 9.0 4.0 - 10.5 K/uL   RBC 4.58 3.87 - 5.11 MIL/uL   Hemoglobin 15.1 (H) 12.0 - 15.0 g/dL   HCT 28.443.4 13.236.0 - 44.046.0 %   MCV 94.8 78.0 - 100.0 fL   MCH 33.0 26.0 - 34.0 pg   MCHC 34.8 30.0 - 36.0 g/dL   RDW 10.219.4 (H) 72.511.5 - 36.615.5 %   Platelets 172 150 - 400 K/uL   Neutrophils Relative % 86 %   Neutro Abs 7.7 1.7 - 7.7 K/uL   Lymphocytes Relative 9 %   Lymphs Abs 0.8 0.7 - 4.0 K/uL   Monocytes Relative 5 %   Monocytes Absolute 0.4 0.1 - 1.0 K/uL   Eosinophils Relative 0 %   Eosinophils Absolute 0.0 0.0 - 0.7 K/uL   Basophils Relative 0 %   Basophils Absolute 0.0 0.0 - 0.1 K/uL  Urinalysis, Routine w reflex microscopic  Result Value Ref Range   Color, Urine YELLOW YELLOW   APPearance CLEAR CLEAR   Specific Gravity, Urine 1.012 1.005 - 1.030   pH 6.0 5.0 - 8.0   Glucose, UA 50 (A) NEGATIVE mg/dL   Hgb urine dipstick SMALL (A) NEGATIVE   Bilirubin Urine NEGATIVE NEGATIVE   Ketones, ur NEGATIVE NEGATIVE mg/dL   Protein, ur >=440>=300 (A) NEGATIVE mg/dL   Nitrite NEGATIVE NEGATIVE   Leukocytes, UA NEGATIVE NEGATIVE   RBC / HPF 0-5 0 - 5 RBC/hpf   WBC, UA 0-5 0 - 5 WBC/hpf   Bacteria, UA NONE SEEN NONE SEEN   Mucus PRESENT    Hyaline Casts, UA PRESENT   Urine rapid drug screen (hosp performed)  Result Value Ref Range   Opiates POSITIVE (A) NONE DETECTED   Cocaine NONE DETECTED NONE DETECTED   Benzodiazepines POSITIVE (A) NONE DETECTED   Amphetamines NONE DETECTED NONE DETECTED   Tetrahydrocannabinol NONE DETECTED NONE DETECTED   Barbiturates (A) NONE DETECTED    Result not available. Reagent lot number recalled by manufacturer.  TSH  Result Value Ref Range   TSH 1.691 0.350 - 4.500 uIU/mL  HIV antibody (Routine Testing)  Result Value Ref Range   HIV Screen 4th Generation wRfx Non Reactive Non Reactive  CBC  Result Value Ref Range   WBC 7.7 4.0 - 10.5  K/uL   RBC 3.75 (L) 3.87 - 5.11 MIL/uL   Hemoglobin 12.3 12.0 - 15.0 g/dL   HCT 34.734.2 (L) 42.536.0 - 95.646.0 %   MCV 91.2 78.0 - 100.0 fL   MCH 32.8 26.0 - 34.0 pg   MCHC 36.0 30.0 - 36.0 g/dL   RDW 38.718.2 (H) 56.411.5 - 33.215.5 %   Platelets 162 150 - 400 K/uL  Creatinine, serum  Result Value Ref Range   Creatinine, Ser 0.72 0.44 - 1.00 mg/dL   GFR calc non Af Amer >60 >60 mL/min   GFR calc Af Amer >60 >60 mL/min  Phosphorus  Result Value Ref Range   Phosphorus 3.4 2.5 - 4.6 mg/dL  Magnesium  Result Value Ref Range   Magnesium 1.3 (L) 1.7 - 2.4 mg/dL  Lactic acid, plasma  Result Value Ref Range   Lactic Acid, Venous 3.4 (HH) 0.5 - 1.9 mmol/L  Vitamin B1  Result Value Ref Range   Vitamin B1 (Thiamine)  225.7 (H) 66.5 - 200.0 nmol/L  Vitamin B12  Result Value Ref Range   Vitamin B-12 301 180 - 914 pg/mL  Lactic acid, plasma  Result Value Ref Range   Lactic Acid, Venous 3.1 (HH) 0.5 - 1.9 mmol/L  Hepatitis panel, acute  Result Value Ref Range   Hepatitis B Surface Ag Negative Negative   HCV Ab <0.1 0.0 - 0.9 s/co ratio   Hep A IgM Negative Negative   Hep B C IgM Negative Negative  Ammonia  Result Value Ref Range   Ammonia 52 (H) 9 - 35 umol/L  CBC  Result Value Ref Range   WBC 6.2 4.0 - 10.5 K/uL   RBC 4.13 3.87 - 5.11 MIL/uL   Hemoglobin 13.3 12.0 - 15.0 g/dL   HCT 16.1 09.6 - 04.5 %   MCV 90.1 78.0 - 100.0 fL   MCH 32.2 26.0 - 34.0 pg   MCHC 35.8 30.0 - 36.0 g/dL   RDW 40.9 (H) 81.1 - 91.4 %   Platelets 177 150 - 400 K/uL  Basic metabolic panel  Result Value Ref Range   Sodium 134 (L) 135 - 145 mmol/L   Potassium 3.0 (L) 3.5 - 5.1 mmol/L   Chloride 99 98 - 111 mmol/L   CO2 19 (L) 22 - 32 mmol/L   Glucose, Bld 97 70 - 99 mg/dL   BUN 5 (L) 6 - 20 mg/dL   Creatinine, Ser 7.82 0.44 - 1.00 mg/dL   Calcium 8.0 (L) 8.9 - 10.3 mg/dL   GFR calc non Af Amer >60 >60 mL/min   GFR calc Af Amer >60 >60 mL/min   Anion gap 16 (H) 5 - 15  Varicella-zoster by PCR  Result Value Ref Range    Source Varicella-Zoster, PCR CSF    Varicella-Zoster, PCR Negative Negative  CSF cell count with differential collection tube #: 1  Result Value Ref Range   Tube # 1    Color, CSF COLORLESS COLORLESS   Appearance, CSF CLEAR CLEAR   Supernatant NOT INDICATED    RBC Count, CSF 1 (H) 0 /cu mm   WBC, CSF 2 0 - 5 /cu mm   Other Cells, CSF TOO FEW TO COUNT, SMEAR AVAILABLE FOR REVIEW   Protein and glucose, CSF  Result Value Ref Range   Glucose, CSF 78 (H) 40 - 70 mg/dL   Total  Protein, CSF 36 15 - 45 mg/dL  Herpes simplex virus (HSV), DNA by PCR Cerebrospinal Fluid  Result Value Ref Range   HSV 1 DNA Negative Negative   HSV 2 DNA Negative Negative  Comprehensive metabolic panel  Result Value Ref Range   Sodium 135 135 - 145 mmol/L   Potassium 2.9 (L) 3.5 - 5.1 mmol/L   Chloride 105 98 - 111 mmol/L   CO2 20 (L) 22 - 32 mmol/L   Glucose, Bld 79 70 - 99 mg/dL   BUN <5 (L) 6 - 20 mg/dL   Creatinine, Ser 9.56 0.44 - 1.00 mg/dL   Calcium 7.9 (L) 8.9 - 10.3 mg/dL   Total Protein 6.7 6.5 - 8.1 g/dL   Albumin 3.6 3.5 - 5.0 g/dL   AST 64 (H) 15 - 41 U/L   ALT 31 0 - 44 U/L   Alkaline Phosphatase 40 38 - 126 U/L   Total Bilirubin 1.3 (H) 0.3 - 1.2 mg/dL   GFR calc non Af Amer >60 >60 mL/min   GFR calc Af Amer >60 >60 mL/min   Anion gap  10 5 - 15  CBC  Result Value Ref Range   WBC 5.4 4.0 - 10.5 K/uL   RBC 3.62 (L) 3.87 - 5.11 MIL/uL   Hemoglobin 11.6 (L) 12.0 - 15.0 g/dL   HCT 74.7 (L) 15.9 - 53.9 %   MCV 93.6 78.0 - 100.0 fL   MCH 32.0 26.0 - 34.0 pg   MCHC 34.2 30.0 - 36.0 g/dL   RDW 67.2 (H) 89.7 - 91.5 %   Platelets 177 150 - 400 K/uL  Magnesium  Result Value Ref Range   Magnesium 1.9 1.7 - 2.4 mg/dL  Phosphorus  Result Value Ref Range   Phosphorus 2.8 2.5 - 4.6 mg/dL  Basic metabolic panel  Result Value Ref Range   Sodium 133 (L) 135 - 145 mmol/L   Potassium 3.1 (L) 3.5 - 5.1 mmol/L   Chloride 103 98 - 111 mmol/L   CO2 22 22 - 32 mmol/L   Glucose, Bld 96 70 - 99  mg/dL   BUN <5 (L) 6 - 20 mg/dL   Creatinine, Ser 0.41 0.44 - 1.00 mg/dL   Calcium 7.8 (L) 8.9 - 10.3 mg/dL   GFR calc non Af Amer >60 >60 mL/min   GFR calc Af Amer >60 >60 mL/min   Anion gap 8 5 - 15  CBC  Result Value Ref Range   WBC 5.7 4.0 - 10.5 K/uL   RBC 3.46 (L) 3.87 - 5.11 MIL/uL   Hemoglobin 11.1 (L) 12.0 - 15.0 g/dL   HCT 36.4 (L) 38.3 - 77.9 %   MCV 92.2 78.0 - 100.0 fL   MCH 32.1 26.0 - 34.0 pg   MCHC 34.8 30.0 - 36.0 g/dL   RDW 39.6 (H) 88.6 - 48.4 %   Platelets 194 150 - 400 K/uL  Phosphorus  Result Value Ref Range   Phosphorus 2.6 2.5 - 4.6 mg/dL  Magnesium  Result Value Ref Range   Magnesium 1.8 1.7 - 2.4 mg/dL  Basic metabolic panel  Result Value Ref Range   Sodium 134 (L) 135 - 145 mmol/L   Potassium 3.0 (L) 3.5 - 5.1 mmol/L   Chloride 109 98 - 111 mmol/L   CO2 21 (L) 22 - 32 mmol/L   Glucose, Bld 96 70 - 99 mg/dL   BUN <5 (L) 6 - 20 mg/dL   Creatinine, Ser 7.20 0.44 - 1.00 mg/dL   Calcium 7.7 (L) 8.9 - 10.3 mg/dL   GFR calc non Af Amer >60 >60 mL/min   GFR calc Af Amer >60 >60 mL/min   Anion gap 4 (L) 5 - 15  Basic metabolic panel  Result Value Ref Range   Sodium 136 135 - 145 mmol/L   Potassium 3.4 (L) 3.5 - 5.1 mmol/L   Chloride 111 98 - 111 mmol/L   CO2 19 (L) 22 - 32 mmol/L   Glucose, Bld 102 (H) 70 - 99 mg/dL   BUN <5 (L) 6 - 20 mg/dL   Creatinine, Ser 7.21 0.44 - 1.00 mg/dL   Calcium 7.7 (L) 8.9 - 10.3 mg/dL   GFR calc non Af Amer >60 >60 mL/min   GFR calc Af Amer >60 >60 mL/min   Anion gap 6 5 - 15  CBG monitoring, ED  Result Value Ref Range   Glucose-Capillary 175 (H) 70 - 99 mg/dL      Assessment & Plan:   1. DDD (degenerative disc disease), lumbar - HYDROcodone-acetaminophen (NORCO) 10-325 MG tablet; Take 1 tablet by mouth every 6 (six)  hours as needed.  Dispense: 120 tablet; Refill: 0 - HYDROcodone-acetaminophen (NORCO) 10-325 MG tablet; Take 1 tablet by mouth every 6 (six) hours as needed.  Dispense: 120 tablet; Refill: 0 -  HYDROcodone-acetaminophen (NORCO) 10-325 MG tablet; Take 1 tablet by mouth every 6 (six) hours as needed.  Dispense: 120 tablet; Refill: 0  2. Moderate episode of recurrent major depressive disorder (HCC) - ALPRAZolam (XANAX) 0.25 MG tablet; Take 1 Tablet by mouth 2 times a day as needed  Dispense: 60 tablet; Refill: 5 - citalopram (CELEXA) 40 MG tablet; Take 1 tablet (40 mg total) by mouth daily.  Dispense: 30 tablet; Refill: 5   Continue all other maintenance medications as listed above.  Follow up plan: Return in about 3 months (around 07/21/2018).  Educational handout given for survey  Remus LofflerAngel S. Aneita Kiger PA-C [facility] [address]  [place], [place] [address] [phone]   04/23/2018, 10:11 PM

## 2018-05-01 ENCOUNTER — Other Ambulatory Visit: Payer: Self-pay | Admitting: Family Medicine

## 2018-05-01 DIAGNOSIS — M5136 Other intervertebral disc degeneration, lumbar region: Secondary | ICD-10-CM

## 2018-06-16 ENCOUNTER — Telehealth: Payer: Self-pay | Admitting: Physician Assistant

## 2018-07-20 ENCOUNTER — Other Ambulatory Visit: Payer: Self-pay

## 2018-07-21 ENCOUNTER — Other Ambulatory Visit: Payer: Self-pay | Admitting: Physician Assistant

## 2018-07-21 ENCOUNTER — Ambulatory Visit: Payer: Medicare Other | Admitting: Physician Assistant

## 2018-07-21 ENCOUNTER — Other Ambulatory Visit: Payer: Self-pay

## 2018-07-21 MED ORDER — HYDROCODONE-ACETAMINOPHEN 10-325 MG PO TABS: 1.0000 | ORAL_TABLET | Freq: Four times a day (QID) | ORAL | 0 refills | Status: DC | PRN

## 2018-08-10 ENCOUNTER — Encounter: Payer: Self-pay | Admitting: Family Medicine

## 2018-08-10 ENCOUNTER — Other Ambulatory Visit: Payer: Self-pay

## 2018-08-10 ENCOUNTER — Ambulatory Visit (INDEPENDENT_AMBULATORY_CARE_PROVIDER_SITE_OTHER): Payer: Medicare Other | Admitting: Family Medicine

## 2018-08-10 ENCOUNTER — Telehealth: Payer: Self-pay | Admitting: Physician Assistant

## 2018-08-10 DIAGNOSIS — J208 Acute bronchitis due to other specified organisms: Secondary | ICD-10-CM

## 2018-08-10 MED ORDER — ALBUTEROL SULFATE 108 (90 BASE) MCG/ACT IN AEPB: 1.0000 | INHALATION_SPRAY | RESPIRATORY_TRACT | 1 refills | Status: DC

## 2018-08-10 MED ORDER — BENZONATATE 100 MG PO CAPS: 100.0000 mg | ORAL_CAPSULE | Freq: Three times a day (TID) | ORAL | 0 refills | Status: DC | PRN

## 2018-08-10 MED ORDER — PREDNISONE 20 MG PO TABS: ORAL_TABLET | ORAL | 0 refills | Status: DC

## 2018-08-10 NOTE — Telephone Encounter (Signed)
Can she be referred to the patient assistance program for medication through the county office?  There is no other option.

## 2018-08-10 NOTE — Telephone Encounter (Signed)
Scheduled pt

## 2018-08-10 NOTE — Progress Notes (Signed)
Virtual Visit via telephone Note Due to COVID-19, visit is conducted virtually and was requested by patient. This visit type was conducted due to national recommendations for restrictions regarding the COVID-19 Pandemic (e.g. social distancing) in an effort to limit this patient's exposure and mitigate transmission in our community. All issues noted in this document were discussed and addressed.  A physical exam was not performed with this format.   I connected with Betty Nelson on 08/10/18 at 1345 by telephone and verified that I am speaking with the correct person using two identifiers. Betty Nelson is currently located at home and family is currently with them during visit. The provider, Kari Baars, FNP is located in their office at time of visit.  I discussed the limitations, risks, security and privacy concerns of performing an evaluation and management service by telephone and the availability of in person appointments. I also discussed with the patient that there may be a patient responsible charge related to this service. The patient expressed understanding and agreed to proceed.  Subjective:  Patient ID: Betty Nelson, female    DOB: [birth year], 58 y.o.   MRN: [id number]  Chief Complaint:  Cough   HPI: Betty Nelson is a 58 y.o. female presenting on 08/10/2018 for Cough   Pt reports ongoing cough for several weeks. States the cough is dry and persistent at times. States she does not have a fever, chills, shortness or breath, fatigue, chest pain, or confusion with the cough. States she does have slight wheezing with the cough.  Cough  This is a recurrent problem. The current episode started 1 to 4 weeks ago. The problem occurs every few minutes. The cough is non-productive. Associated symptoms include wheezing. Pertinent negatives include no chest pain, chills, ear congestion, ear pain, fever, headaches, heartburn, hemoptysis, myalgias, nasal congestion, postnasal drip,  rash, rhinorrhea, sore throat, shortness of breath, sweats or weight loss. The symptoms are aggravated by exercise and lying down. She has tried OTC cough suppressant for the symptoms. The treatment provided mild relief.     Relevant past medical, surgical, family, and social history reviewed and updated as indicated.  Allergies and medications reviewed and updated.   Past Medical History:  Diagnosis Date  . Anxiety   . Bipolar disorder (HCC)   . Chronic lower back pain   . DDD (degenerative disc disease), lumbar   . Depression   . Disability of walking   . Headache    "about q wk" (10/09/2017)  . Hypertension   . Seizure (HCC) 10/05/2017   "that was my 1st" (10/09/2017)  . 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 CESAREAN SECTION  1985  . TONSILLECTOMY      Social History   Socioeconomic History  . Marital status: Married    Spouse name: Not on file  . Number of children: Not on file  . Years of education: Not on file  . Highest education level: Not on file  Occupational History  . Not on file  Social Needs  . Financial resource strain: Not on file  . Food insecurity:    Worry: Not on file    Inability: Not on file  . Transportation needs:    Medical: Not on file    Non-medical: Not on file  Tobacco Use  . Smoking status: Current Every Day Smoker    Packs/day: 0.33    Years: 43.00    Pack years: 14.19  Types: Cigarettes  . Smokeless tobacco: Former Neurosurgeon    Types: Snuff  Substance and Sexual Activity  . Alcohol use: Yes    Alcohol/week: 2.0 standard drinks    Types: 2 Cans of beer per week    Comment: 10/09/2017 "~ 2 beers/wk"  . Drug use: No  . Sexual activity: Not Currently  Lifestyle  . Physical activity:    Days per week: Not on file    Minutes per session: Not on file  . Stress: Not on file  Relationships  . Social connections:    Talks on phone: Not on file    Gets together: Not on file     Attends religious service: Not on file    Active member of club or organization: Not on file    Attends meetings of clubs or organizations: Not on file    Relationship status: Not on file  . Intimate partner violence:    Fear of current or ex partner: Not on file    Emotionally abused: Not on file    Physically abused: Not on file    Forced sexual activity: Not on file  Other Topics Concern  . Not on file  Social History Narrative  . Not on file    Outpatient Encounter Medications as of 08/10/2018  Medication Sig  . Albuterol Sulfate (PROAIR RESPICLICK) 108 (90 Base) MCG/ACT AEPB Inhale 1-2 puffs into the lungs every 6 (six) weeks. prn  . ALPRAZolam (XANAX) 0.25 MG tablet Take 1 Tablet by mouth 2 times a day as needed  . benzonatate (TESSALON PERLES) 100 MG capsule Take 1 capsule (100 mg total) by mouth 3 (three) times daily as needed for cough.  . citalopram (CELEXA) 40 MG tablet Take 1 tablet (40 mg total) by mouth daily.  . cyclobenzaprine (FLEXERIL) 10 MG tablet TAKE 1 TABLET BY MOUTH TWICE A DAY  . feeding supplement, ENSURE ENLIVE, (ENSURE ENLIVE) LIQD Take 237 mLs by mouth 2 (two) times daily between meals.  . folic acid (FOLVITE) 1 MG tablet Take 1 tablet (1 mg total) by mouth daily.  Marland Kitchen gabapentin (NEURONTIN) 100 MG capsule Take 1-3 capsules (100-300 mg total) by mouth at bedtime.  Marland Kitchen HYDROcodone-acetaminophen (NORCO) 10-325 MG tablet Take 1 tablet by mouth every 6 (six) hours as needed.  Marland Kitchen HYDROcodone-acetaminophen (NORCO) 10-325 MG tablet Take 1 tablet by mouth every 6 (six) hours as needed.  Marland Kitchen HYDROcodone-acetaminophen (NORCO) 10-325 MG tablet Take 1 tablet by mouth every 6 (six) hours as needed.  . loratadine (CLARITIN) 10 MG tablet Take 1 tablet (10 mg total) by mouth daily.  . megestrol (MEGACE) 40 MG tablet TAKE 1 TABLET BY MOUTH TWICE A DAY  . meloxicam (MOBIC) 7.5 MG tablet Take 1 tablet (7.5 mg total) by mouth daily.  . metoprolol tartrate (LOPRESSOR) 25 MG tablet Take 1  tablet (25 mg total) by mouth 2 (two) times daily.  Marland Kitchen omeprazole (PRILOSEC) 20 MG capsule Take 1 capsule (20 mg total) by mouth daily.  . predniSONE (DELTASONE) 20 MG tablet 2 po at sametime daily for 5 days  . thiamine (VITAMIN B-1) 100 MG tablet Take 1 tablet (100 mg total) by mouth daily.  . traZODone (DESYREL) 300 MG tablet Take 1 tablet (300 mg total) by mouth at bedtime.   No facility-administered encounter medications on file as of 08/10/2018.     No Known Allergies  Review of Systems  Constitutional: Negative for activity change, appetite change, chills, diaphoresis, fatigue, fever, unexpected weight change and  weight loss.  HENT: Negative for ear pain, postnasal drip, rhinorrhea and sore throat.   Respiratory: Positive for cough and wheezing. Negative for hemoptysis, chest tightness and shortness of breath.   Cardiovascular: Negative for chest pain, palpitations and leg swelling.  Gastrointestinal: Negative for heartburn.  Genitourinary: Negative for decreased urine volume and difficulty urinating.  Musculoskeletal: Negative for arthralgias, back pain and myalgias.  Skin: Negative for color change, pallor and rash.  Neurological: Negative for weakness and headaches.  Psychiatric/Behavioral: Negative for confusion.         Observations/Objective: No vital signs or physical exam, this was a telephone or virtual health encounter.  Pt alert and oriented, answers all questions appropriately, and able to speak in full sentences.    Assessment and Plan: Betty Cloudamela was seen today for cough.  Diagnoses and all orders for this visit:  Acute viral bronchitis Reported symptoms consistent with bronchitis. No fever, chills, shortness of breath, or weakness. No chest pain or palpitations. Will treat with below. Symptomatic care discussed. Report any new or worsening symptoms.  -     benzonatate (TESSALON PERLES) 100 MG capsule; Take 1 capsule (100 mg total) by mouth 3 (three) times daily  as needed for cough. -     predniSONE (DELTASONE) 20 MG tablet; 2 po at sametime daily for 5 days -     Albuterol Sulfate (PROAIR RESPICLICK) 108 (90 Base) MCG/ACT AEPB; Inhale 1-2 puffs into the lungs every 6 (six) weeks. prn     Follow Up Instructions: Return in about 2 weeks (around 08/24/2018), or if symptoms worsen or fail to improve, for cough.    I discussed the assessment and treatment plan with the patient. The patient was provided an opportunity to ask questions and all were answered. The patient agreed with the plan and demonstrated an understanding of the instructions.   The patient was advised to call back or seek an in-person evaluation if the symptoms worsen or if the condition fails to improve as anticipated.  The above assessment and management plan was discussed with the patient. The patient verbalized understanding of and has agreed to the management plan. Patient is aware to call the clinic if symptoms persist or worsen. Patient is aware when to return to the clinic for a follow-up visit. Patient educated on when it is appropriate to go to the emergency department.    I provided 15 minutes of non-face-to-face time during this encounter. The call started at 1345. The call ended at 1400. The other time was used for coordination of care.    Kari BaarsMichelle Kshawn Canal, FNP-C [facility] [address] [place], [place] [address] [phone]

## 2018-08-11 ENCOUNTER — Telehealth: Payer: Self-pay | Admitting: Physician Assistant

## 2018-08-11 MED ORDER — SUMATRIPTAN SUCCINATE 100 MG PO TABS: 100.0000 mg | ORAL_TABLET | ORAL | 2 refills | Status: DC | PRN

## 2018-08-11 NOTE — Telephone Encounter (Signed)
Please call in Imitrex 100 mg 1 tablet at the start of headache, may repeat in 2 hours #6 with 2 refills. this should come in at a generic and should not be very expensive.  Or if needed the good Rx card will probably make it discounted.

## 2018-08-11 NOTE — Telephone Encounter (Signed)
Patient states she has a migraine and can not get it to go away. She wants to know if there is anything you can give to help.

## 2018-08-11 NOTE — Telephone Encounter (Signed)
Patient aware and rx sent to pharmacy.  

## 2018-08-12 ENCOUNTER — Encounter: Payer: Self-pay | Admitting: Physician Assistant

## 2018-08-12 ENCOUNTER — Ambulatory Visit (INDEPENDENT_AMBULATORY_CARE_PROVIDER_SITE_OTHER): Payer: Medicare Other | Admitting: Physician Assistant

## 2018-08-12 ENCOUNTER — Other Ambulatory Visit: Payer: Self-pay

## 2018-08-12 ENCOUNTER — Other Ambulatory Visit: Payer: Self-pay | Admitting: Physician Assistant

## 2018-08-12 DIAGNOSIS — R05 Cough: Secondary | ICD-10-CM | POA: Diagnosis not present

## 2018-08-12 DIAGNOSIS — R55 Syncope and collapse: Secondary | ICD-10-CM | POA: Diagnosis not present

## 2018-08-12 DIAGNOSIS — G8929 Other chronic pain: Secondary | ICD-10-CM

## 2018-08-12 DIAGNOSIS — R51 Headache: Secondary | ICD-10-CM

## 2018-08-12 DIAGNOSIS — R059 Cough, unspecified: Secondary | ICD-10-CM

## 2018-08-12 MED ORDER — AZITHROMYCIN 250 MG PO TABS: ORAL_TABLET | ORAL | 0 refills | Status: DC

## 2018-08-12 NOTE — Progress Notes (Signed)
Telephone visit  Subjective: Betty Nelson, syncopal episodes, cough PCP: Remus Loffler, PA-C WGN:FAOZHY Betty Nelson is a 58 y.o. female calls for telephone consult today. Patient provides verbal consent for consult held via phone.  Patient is identified with 2 separate identifiers.  At this time the entire area is on COVID-19 social distancing and stay home orders are in place.  Patient is of higher risk and therefore we are performing this by a virtual method.  Location of patient: Home Location of provider: HOME Others present for call: Daughter and husband  [name] has had several weeks of upper respiratory congestion and cough.  She was started a couple of days ago with some prednisone and benzonatate.  She was unable to get the albuterol because of the cost.  She is continued with cough, headache, syncopal episodes as reported by her husband.  She has had about 4 times where she was walking and started to collapse and he was able to catch her.  She reports that she does have a chronic cough to the point that she will vomit from coughing so hard.  They do not know of any COVID exposure that she has had.  Sumatriptan was sent in yesterday for the headache and she took all 6 of the pills.  I have instructed them that she should never do this again.  At the most should be 2 and a day.  Because of all these neurologic symptoms I have asked him to take her to the emergency room for further work-up.   ROS: Per HPI  No Known Allergies Past Medical History:  Diagnosis Date  . Anxiety   . Bipolar disorder (HCC)   . Chronic lower back pain   . DDD (degenerative disc disease), lumbar   . Depression   . Disability of walking   . Headache    "about q wk" (10/09/2017)  . Hypertension   . Seizure (HCC) 10/05/2017   "that was my 1st" (10/09/2017)  . 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  Albuterol Sulfate (PROAIR RESPICLICK) 108 (90 Base) MCG/ACT AEPB,  Inhale 1-2 puffs into the lungs every 6 (six) weeks. prn, Disp: 1 each, Rfl: 1 .  ALPRAZolam (XANAX) 0.25 MG tablet, Take 1 Tablet by mouth 2 times a day as needed, Disp: 60 tablet, Rfl: 5 .  benzonatate (TESSALON PERLES) 100 MG capsule, Take 1 capsule (100 mg total) by mouth 3 (three) times daily as needed for cough., Disp: 20 capsule, Rfl: 0 .  citalopram (CELEXA) 40 MG tablet, Take 1 tablet (40 mg total) by mouth daily., Disp: 30 tablet, Rfl: 5 .  cyclobenzaprine (FLEXERIL) 10 MG tablet, TAKE 1 TABLET BY MOUTH TWICE A DAY, Disp: 60 tablet, Rfl: 0 .  feeding supplement, ENSURE ENLIVE, (ENSURE ENLIVE) LIQD, Take 237 mLs by mouth 2 (two) times daily between meals., Disp: 60 Bottle, Rfl: 0 .  folic acid (FOLVITE) 1 MG tablet, Take 1 tablet (1 mg total) by mouth daily., Disp: 30 tablet, Rfl: 3 .  gabapentin (NEURONTIN) 100 MG capsule, Take 1-3 capsules (100-300 mg total) by mouth at bedtime., Disp: 90 capsule, Rfl: 3 .  HYDROcodone-acetaminophen (NORCO) 10-325 MG tablet, Take 1 tablet by mouth every 6 (six) hours as needed., Disp: 120 tablet, Rfl: 0 .  HYDROcodone-acetaminophen (NORCO) 10-325 MG tablet, Take 1 tablet by mouth every 6 (six) hours as needed., Disp: 120 tablet, Rfl: 0 .  HYDROcodone-acetaminophen (NORCO) 10-325 MG tablet, Take 1 tablet  by mouth every 6 (six) hours as needed., Disp: 120 tablet, Rfl: 0 .  loratadine (CLARITIN) 10 MG tablet, Take 1 tablet (10 mg total) by mouth daily., Disp: 30 tablet, Rfl: 0 .  megestrol (MEGACE) 40 MG tablet, TAKE 1 TABLET BY MOUTH TWICE A DAY, Disp: 180 tablet, Rfl: 1 .  meloxicam (MOBIC) 7.5 MG tablet, Take 1 tablet (7.5 mg total) by mouth daily., Disp: 30 tablet, Rfl: 11 .  metoprolol tartrate (LOPRESSOR) 25 MG tablet, Take 1 tablet (25 mg total) by mouth 2 (two) times daily., Disp: 60 tablet, Rfl: 3 .  omeprazole (PRILOSEC) 20 MG capsule, Take 1 capsule (20 mg total) by mouth daily., Disp: 30 capsule, Rfl: 11 .  predniSONE (DELTASONE) 20 MG tablet, 2  po at sametime daily for 5 days, Disp: 10 tablet, Rfl: 0 .  SUMAtriptan (IMITREX) 100 MG tablet, Take 1 tablet (100 mg total) by mouth every 2 (two) hours as needed for migraine. May repeat in 2 hours if headache persists or recurs., Disp: 6 tablet, Rfl: 2 .  thiamine (VITAMIN B-1) 100 MG tablet, Take 1 tablet (100 mg total) by mouth daily., Disp: 30 tablet, Rfl: 0 .  traZODone (DESYREL) 300 MG tablet, Take 1 tablet (300 mg total) by mouth at bedtime., Disp: 30 tablet, Rfl: 11  Assessment/ Plan: 59 y.o. female   1. Chronic intractable headache, unspecified headache type Go to emergency department for work-up  2. Syncope, unspecified syncope type Go to emergency department for work-up  3. Cough Continue current medications   Start time: 10:04 AM End time: 10:16 AM  No orders of the defined types were placed in this encounter.   Prudy Feeler PA-C [facility] [phone]

## 2018-08-12 NOTE — Progress Notes (Signed)
.  ak

## 2018-08-14 ENCOUNTER — Other Ambulatory Visit: Payer: Self-pay

## 2018-08-14 ENCOUNTER — Ambulatory Visit (INDEPENDENT_AMBULATORY_CARE_PROVIDER_SITE_OTHER): Payer: Medicare Other | Admitting: Physician Assistant

## 2018-08-14 DIAGNOSIS — J209 Acute bronchitis, unspecified: Secondary | ICD-10-CM | POA: Diagnosis not present

## 2018-08-14 MED ORDER — MELOXICAM 7.5 MG PO TABS: 7.5000 mg | ORAL_TABLET | Freq: Every day | ORAL | 11 refills | Status: DC

## 2018-08-14 MED ORDER — BENZONATATE 100 MG PO CAPS: 100.0000 mg | ORAL_CAPSULE | Freq: Three times a day (TID) | ORAL | 0 refills | Status: DC | PRN

## 2018-08-14 MED ORDER — GABAPENTIN 100 MG PO CAPS: 100.0000 mg | ORAL_CAPSULE | Freq: Every day | ORAL | 3 refills | Status: DC

## 2018-08-17 ENCOUNTER — Encounter: Payer: Self-pay | Admitting: Physician Assistant

## 2018-08-17 NOTE — Progress Notes (Signed)
Telephone visit  Subjective: UR:KYHCW PCP: Remus Loffler, PA-C CBJ:SEGBTD G Briere is a 58 y.o. female calls for telephone consult today. Patient provides verbal consent for consult held via phone.  Patient is identified with 2 separate identifiers.  At this time the entire area is on COVID-19 social distancing and stay home orders are in place.  Patient is of higher risk and therefore we are performing this by a virtual method.  Location of patient: home Location of provider: [facility] Others present for call: daughter  Patient with several days of progressing upper respiratory and bronchial symptoms. Initially there was more upper respiratory congestion. This progressed to having significant cough that is productive throughout the day and severe at night. There is occasional wheezing after coughing. Sometimes there is slight dyspnea on exertion. It is productive mucus that is yellow in color. Denies any blood.    ROS: Per HPI  No Known Allergies Past Medical History:  Diagnosis Date  . Anxiety   . Bipolar disorder (HCC)   . Chronic lower back pain   . DDD (degenerative disc disease), lumbar   . Depression   . Disability of walking   . Headache    "about q wk" (10/09/2017)  . Hypertension   . Seizure (HCC) 10/05/2017   "that was my 1st" (10/09/2017)  . 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  Albuterol Sulfate (PROAIR RESPICLICK) 108 (90 Base) MCG/ACT AEPB, Inhale 1-2 puffs into the lungs every 6 (six) weeks. prn, Disp: 1 each, Rfl: 1 .  ALPRAZolam (XANAX) 0.25 MG tablet, Take 1 Tablet by mouth 2 times a day as needed, Disp: 60 tablet, Rfl: 5 .  azithromycin (ZITHROMAX Z-PAK) 250 MG tablet, Bronchitis. Take as directed, Disp: 6 each, Rfl: 0 .  benzonatate (TESSALON PERLES) 100 MG capsule, Take 1 capsule (100 mg total) by mouth 3 (three) times daily as needed for cough., Disp: 60 capsule, Rfl: 0 .  citalopram (CELEXA) 40 MG tablet, Take 1  tablet (40 mg total) by mouth daily., Disp: 30 tablet, Rfl: 5 .  cyclobenzaprine (FLEXERIL) 10 MG tablet, TAKE 1 TABLET BY MOUTH TWICE A DAY, Disp: 60 tablet, Rfl: 0 .  feeding supplement, ENSURE ENLIVE, (ENSURE ENLIVE) LIQD, Take 237 mLs by mouth 2 (two) times daily between meals., Disp: 60 Bottle, Rfl: 0 .  folic acid (FOLVITE) 1 MG tablet, Take 1 tablet (1 mg total) by mouth daily., Disp: 30 tablet, Rfl: 3 .  gabapentin (NEURONTIN) 100 MG capsule, Take 1-3 capsules (100-300 mg total) by mouth at bedtime., Disp: 90 capsule, Rfl: 3 .  HYDROcodone-acetaminophen (NORCO) 10-325 MG tablet, Take 1 tablet by mouth every 6 (six) hours as needed., Disp: 120 tablet, Rfl: 0 .  HYDROcodone-acetaminophen (NORCO) 10-325 MG tablet, Take 1 tablet by mouth every 6 (six) hours as needed., Disp: 120 tablet, Rfl: 0 .  HYDROcodone-acetaminophen (NORCO) 10-325 MG tablet, Take 1 tablet by mouth every 6 (six) hours as needed., Disp: 120 tablet, Rfl: 0 .  loratadine (CLARITIN) 10 MG tablet, Take 1 tablet (10 mg total) by mouth daily., Disp: 30 tablet, Rfl: 0 .  megestrol (MEGACE) 40 MG tablet, TAKE 1 TABLET BY MOUTH TWICE A DAY, Disp: 180 tablet, Rfl: 1 .  meloxicam (MOBIC) 7.5 MG tablet, Take 1 tablet (7.5 mg total) by mouth daily., Disp: 30 tablet, Rfl: 11 .  metoprolol tartrate (LOPRESSOR) 25 MG tablet, Take 1 tablet (25 mg total) by mouth 2 (two) times daily.,  Disp: 60 tablet, Rfl: 3 .  omeprazole (PRILOSEC) 20 MG capsule, Take 1 capsule (20 mg total) by mouth daily., Disp: 30 capsule, Rfl: 11 .  predniSONE (DELTASONE) 20 MG tablet, 2 po at sametime daily for 5 days, Disp: 10 tablet, Rfl: 0 .  SUMAtriptan (IMITREX) 100 MG tablet, Take 1 tablet (100 mg total) by mouth every 2 (two) hours as needed for migraine. May repeat in 2 hours if headache persists or recurs., Disp: 6 tablet, Rfl: 2 .  thiamine (VITAMIN B-1) 100 MG tablet, Take 1 tablet (100 mg total) by mouth daily., Disp: 30 tablet, Rfl: 0 .  traZODone (DESYREL)  300 MG tablet, Take 1 tablet (300 mg total) by mouth at bedtime., Disp: 30 tablet, Rfl: 11  Assessment/ Plan: 58 y.o. female   1. Bronchitis, acute, with bronchospasm - benzonatate (TESSALON PERLES) 100 MG capsule; Take 1 capsule (100 mg total) by mouth 3 (three) times daily as needed for cough.  Dispense: 60 capsule; Refill: 0 Complete the Zithromax pack that was sent to the pharmacy, which she had not picked up yet  Anything worsens, having severe shortness of breath, severe fever chills, please report to the emergency department.  Start time: 4:12 PM End time: 4:23 PM  Meds ordered this encounter  Medications  . benzonatate (TESSALON PERLES) 100 MG capsule    Sig: Take 1 capsule (100 mg total) by mouth 3 (three) times daily as needed for cough.    Dispense:  60 capsule    Refill:  0    Order Specific Question:   Supervising Provider    Answer:   Raliegh IpGOTTSCHALK, [name] [[id number]]  . meloxicam (MOBIC) 7.5 MG tablet    Sig: Take 1 tablet (7.5 mg total) by mouth daily.    Dispense:  30 tablet    Refill:  11    Order Specific Question:   Supervising Provider    Answer:   Raliegh IpGOTTSCHALK, [name] [[id number]]  . gabapentin (NEURONTIN) 100 MG capsule    Sig: Take 1-3 capsules (100-300 mg total) by mouth at bedtime.    Dispense:  90 capsule    Refill:  3    Order Specific Question:   Supervising Provider    Answer:   Raliegh IpGOTTSCHALK, [name] [[id number]]    Prudy FeelerAngel Dyllin Gulley PA-C [facility] [phone]

## 2018-08-19 ENCOUNTER — Other Ambulatory Visit: Payer: Self-pay

## 2018-08-19 ENCOUNTER — Ambulatory Visit (INDEPENDENT_AMBULATORY_CARE_PROVIDER_SITE_OTHER): Payer: Medicare Other | Admitting: Physician Assistant

## 2018-08-19 DIAGNOSIS — J440 Chronic obstructive pulmonary disease with acute lower respiratory infection: Secondary | ICD-10-CM | POA: Diagnosis not present

## 2018-08-19 DIAGNOSIS — J209 Acute bronchitis, unspecified: Secondary | ICD-10-CM

## 2018-08-19 DIAGNOSIS — M5136 Other intervertebral disc degeneration, lumbar region: Secondary | ICD-10-CM | POA: Diagnosis not present

## 2018-08-19 MED ORDER — HYDROCODONE-ACETAMINOPHEN 10-325 MG PO TABS: 1.0000 | ORAL_TABLET | Freq: Four times a day (QID) | ORAL | 0 refills | Status: DC | PRN

## 2018-08-20 ENCOUNTER — Other Ambulatory Visit: Payer: Self-pay | Admitting: Physician Assistant

## 2018-08-24 ENCOUNTER — Encounter: Payer: Self-pay | Admitting: Physician Assistant

## 2018-08-24 DIAGNOSIS — J209 Acute bronchitis, unspecified: Secondary | ICD-10-CM | POA: Insufficient documentation

## 2018-08-24 DIAGNOSIS — J440 Chronic obstructive pulmonary disease with acute lower respiratory infection: Secondary | ICD-10-CM | POA: Insufficient documentation

## 2018-08-24 NOTE — Progress Notes (Signed)
Telephone visit  Subjective: CC: Chronic pain from degenerative disc disease, recurrent bronchitis PCP: Remus Loffler, PA-C Betty Nelson is a 58 y.o. female calls for telephone consult today. Patient provides verbal consent for consult held via phone.  Patient is identified with 2 separate identifiers.  At this time the entire area is on COVID-19 social distancing and stay home orders are in place.  Patient is of higher risk and therefore we are performing this by a virtual method.  Location of patient: Home Location of provider: [facility] Others present for call: Daughter  The patient is having a telephone visit because she is still having a lot of coughing and congestion from her bronchitis.  She has had these throughout the winter.  She is a non-smoker.  I do feel that she is at the point of having bronchitis and COPD.  She is not able to get to a pulmonologist at this time.  And because of her illness she is not able to come into our office today.  So I am going to give her 1 refill on her pain medication until she can get back again.  She agrees to come in in a month.  She and her daughter states that there is no difficulty with this medication.  She is to continue and finish her medication for her bronchitis.   ROS: Per HPI  No Known Allergies Past Medical History:  Diagnosis Date  . Anxiety   . Bipolar disorder (HCC)   . Chronic lower back pain   . DDD (degenerative disc disease), lumbar   . Depression   . Disability of walking   . Headache    "about q wk" (10/09/2017)  . Hypertension   . Seizure (HCC) 10/05/2017   "that was my 1st" (10/09/2017)  . 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  Albuterol Sulfate (PROAIR RESPICLICK) 108 (90 Base) MCG/ACT AEPB, Inhale 1-2 puffs into the lungs every 6 (six) weeks. prn, Disp: 1 each, Rfl: 1 .  ALPRAZolam (XANAX) 0.25 MG tablet, Take 1 Tablet by mouth 2 times a day as needed, Disp: 60 tablet,  Rfl: 5 .  benzonatate (TESSALON PERLES) 100 MG capsule, Take 1 capsule (100 mg total) by mouth 3 (three) times daily as needed for cough., Disp: 60 capsule, Rfl: 0 .  citalopram (CELEXA) 40 MG tablet, Take 1 tablet (40 mg total) by mouth daily., Disp: 30 tablet, Rfl: 5 .  cyclobenzaprine (FLEXERIL) 10 MG tablet, TAKE 1 TABLET BY MOUTH TWICE A DAY, Disp: 60 tablet, Rfl: 0 .  feeding supplement, ENSURE ENLIVE, (ENSURE ENLIVE) LIQD, Take 237 mLs by mouth 2 (two) times daily between meals., Disp: 60 Bottle, Rfl: 0 .  folic acid (FOLVITE) 1 MG tablet, Take 1 tablet (1 mg total) by mouth daily., Disp: 30 tablet, Rfl: 3 .  gabapentin (NEURONTIN) 100 MG capsule, Take 1-3 capsules (100-300 mg total) by mouth at bedtime., Disp: 90 capsule, Rfl: 3 .  HYDROcodone-acetaminophen (NORCO) 10-325 MG tablet, Take 1 tablet by mouth every 6 (six) hours as needed., Disp: 120 tablet, Rfl: 0 .  HYDROcodone-acetaminophen (NORCO) 10-325 MG tablet, Take 1 tablet by mouth every 6 (six) hours as needed., Disp: 120 tablet, Rfl: 0 .  HYDROcodone-acetaminophen (NORCO) 10-325 MG tablet, Take 1 tablet by mouth every 6 (six) hours as needed., Disp: 120 tablet, Rfl: 0 .  loratadine (CLARITIN) 10 MG tablet, Take 1 tablet (10 mg total) by mouth daily., Disp: 30  tablet, Rfl: 0 .  megestrol (MEGACE) 40 MG tablet, TAKE 1 TABLET BY MOUTH TWICE A DAY, Disp: 60 tablet, Rfl: 0 .  meloxicam (MOBIC) 7.5 MG tablet, Take 1 tablet (7.5 mg total) by mouth daily., Disp: 30 tablet, Rfl: 11 .  metoprolol tartrate (LOPRESSOR) 25 MG tablet, Take 1 tablet (25 mg total) by mouth 2 (two) times daily., Disp: 60 tablet, Rfl: 3 .  omeprazole (PRILOSEC) 20 MG capsule, Take 1 capsule (20 mg total) by mouth daily., Disp: 30 capsule, Rfl: 11 .  SUMAtriptan (IMITREX) 100 MG tablet, Take 1 tablet (100 mg total) by mouth every 2 (two) hours as needed for migraine. May repeat in 2 hours if headache persists or recurs., Disp: 6 tablet, Rfl: 2 .  thiamine (VITAMIN B-1)  100 MG tablet, Take 1 tablet (100 mg total) by mouth daily., Disp: 30 tablet, Rfl: 0 .  traZODone (DESYREL) 300 MG tablet, Take 1 tablet (300 mg total) by mouth at bedtime., Disp: 30 tablet, Rfl: 11  Assessment/ Plan: 58 y.o. female   1. DDD (degenerative disc disease), lumbar - HYDROcodone-acetaminophen (NORCO) 10-325 MG tablet; Take 1 tablet by mouth every 6 (six) hours as needed.  Dispense: 120 tablet; Refill: 0  2. Acute bronchitis with COPD (HCC) Finish medications   Start time: 9:02 AM End time: 9:14 AM  Meds ordered this encounter  Medications  . HYDROcodone-acetaminophen (NORCO) 10-325 MG tablet    Sig: Take 1 tablet by mouth every 6 (six) hours as needed.    Dispense:  120 tablet    Refill:  0    Order Specific Question:   Supervising Provider    Answer:   Raliegh IpGOTTSCHALK, [name] [[id number]]    Prudy FeelerAngel Regnald Bowens PA-C [facility] [phone]

## 2018-09-04 ENCOUNTER — Telehealth: Payer: Self-pay | Admitting: Physician Assistant

## 2018-09-04 ENCOUNTER — Other Ambulatory Visit: Payer: Self-pay | Admitting: Family Medicine

## 2018-09-04 DIAGNOSIS — M5136 Other intervertebral disc degeneration, lumbar region: Secondary | ICD-10-CM

## 2018-09-13 ENCOUNTER — Other Ambulatory Visit: Payer: Self-pay | Admitting: Family Medicine

## 2018-09-18 ENCOUNTER — Encounter: Payer: Self-pay | Admitting: Physician Assistant

## 2018-09-18 ENCOUNTER — Other Ambulatory Visit: Payer: Self-pay

## 2018-09-18 ENCOUNTER — Ambulatory Visit (INDEPENDENT_AMBULATORY_CARE_PROVIDER_SITE_OTHER): Payer: Medicare Other | Admitting: Physician Assistant

## 2018-09-18 DIAGNOSIS — J209 Acute bronchitis, unspecified: Secondary | ICD-10-CM

## 2018-09-18 DIAGNOSIS — F331 Major depressive disorder, recurrent, moderate: Secondary | ICD-10-CM

## 2018-09-18 DIAGNOSIS — M5136 Other intervertebral disc degeneration, lumbar region: Secondary | ICD-10-CM

## 2018-09-18 MED ORDER — ALPRAZOLAM 0.25 MG PO TABS: ORAL_TABLET | ORAL | 5 refills | Status: DC

## 2018-09-18 MED ORDER — BENZONATATE 100 MG PO CAPS: 100.0000 mg | ORAL_CAPSULE | Freq: Three times a day (TID) | ORAL | 5 refills | Status: DC | PRN

## 2018-09-18 MED ORDER — HYDROCODONE-ACETAMINOPHEN 10-325 MG PO TABS: 1.0000 | ORAL_TABLET | Freq: Four times a day (QID) | ORAL | 0 refills | Status: DC | PRN

## 2018-09-18 NOTE — Progress Notes (Signed)
Telephone visit  Subjective: KG:MWNUUV medication PCP: Terald Sleeper, PA-C OZD:GUYQIH Betty Nelson is a 58 y.o. female calls for telephone consult today. Patient provides verbal consent for consult held via phone.  Patient is identified with 2 separate identifiers.  At this time the entire area is on COVID-19 social distancing and stay home orders are in place.  Patient is of higher risk and therefore we are performing this by a virtual method.  Location of patient: home Location of provider: [facility] Others present for call: Daughter amelia   This is for a recheck on the patient's chronic pain medication use.  She is currently having a cough and is unable to come in because of COVID restrictions.  I am only getting it refilled 1 month at this time so that she can try to get back in in another month.  The patient does have COPD and does frequently get bronchitis.  Otherwise she states she is doing well.   ROS: Per HPI  No Known Allergies Past Medical History:  Diagnosis Date  . Anxiety   . Bipolar disorder ([place])   . Chronic lower back pain   . DDD (degenerative disc disease), lumbar   . Depression   . Disability of walking   . Headache    "about q wk" (10/09/2017)  . Hypertension   . Seizure ([place]) 10/05/2017   "that was my 1st" (10/09/2017)  . Todd's paralysis ([place]) 10/05/2017    Current Outpatient Medications:  .  Albuterol Sulfate (PROAIR RESPICLICK) 474 (90 Base) MCG/ACT AEPB, Inhale 1-2 puffs into the lungs every 6 (six) weeks. prn, Disp: 1 each, Rfl: 1 .  ALPRAZolam (XANAX) 0.25 MG tablet, Take 1 Tablet by mouth 2 times a day as needed, Disp: 60 tablet, Rfl: 5 .  benzonatate (TESSALON PERLES) 100 MG capsule, Take 1 capsule (100 mg total) by mouth 3 (three) times daily as needed for cough., Disp: 60 capsule, Rfl: 5 .  citalopram (CELEXA) 40 MG tablet, Take 1 tablet (40 mg total) by mouth daily., Disp: 30 tablet, Rfl: 5 .  cyclobenzaprine (FLEXERIL) 10 MG tablet, TAKE 1 TABLET  BY MOUTH TWICE A DAY, Disp: 60 tablet, Rfl: 5 .  feeding supplement, ENSURE ENLIVE, (ENSURE ENLIVE) LIQD, Take 237 mLs by mouth 2 (two) times daily between meals., Disp: 60 Bottle, Rfl: 0 .  folic acid (FOLVITE) 1 MG tablet, Take 1 tablet (1 mg total) by mouth daily., Disp: 30 tablet, Rfl: 3 .  gabapentin (NEURONTIN) 100 MG capsule, Take 1-3 capsules (100-300 mg total) by mouth at bedtime., Disp: 90 capsule, Rfl: 3 .  HYDROcodone-acetaminophen (NORCO) 10-325 MG tablet, Take 1 tablet by mouth every 6 (six) hours as needed., Disp: 120 tablet, Rfl: 0 .  HYDROcodone-acetaminophen (NORCO) 10-325 MG tablet, Take 1 tablet by mouth every 6 (six) hours as needed., Disp: 120 tablet, Rfl: 0 .  HYDROcodone-acetaminophen (NORCO) 10-325 MG tablet, Take 1 tablet by mouth every 6 (six) hours as needed., Disp: 120 tablet, Rfl: 0 .  loratadine (CLARITIN) 10 MG tablet, Take 1 tablet (10 mg total) by mouth daily., Disp: 30 tablet, Rfl: 0 .  megestrol (MEGACE) 40 MG tablet, TAKE 1 TABLET BY MOUTH TWICE A DAY, Disp: 60 tablet, Rfl: 0 .  meloxicam (MOBIC) 7.5 MG tablet, Take 1 tablet (7.5 mg total) by mouth daily., Disp: 30 tablet, Rfl: 11 .  metoprolol tartrate (LOPRESSOR) 25 MG tablet, Take 1 tablet (25 mg total) by mouth 2 (two) times daily., Disp: 60  tablet, Rfl: 3 .  omeprazole (PRILOSEC) 20 MG capsule, Take 1 capsule (20 mg total) by mouth daily., Disp: 30 capsule, Rfl: 11 .  SUMAtriptan (IMITREX) 100 MG tablet, Take 1 tablet (100 mg total) by mouth every 2 (two) hours as needed for migraine. May repeat in 2 hours if headache persists or recurs., Disp: 6 tablet, Rfl: 2 .  thiamine (VITAMIN B-1) 100 MG tablet, Take 1 tablet (100 mg total) by mouth daily., Disp: 30 tablet, Rfl: 0 .  traZODone (DESYREL) 300 MG tablet, Take 1 tablet (300 mg total) by mouth at bedtime., Disp: 30 tablet, Rfl: 11  Assessment/ Plan: 58 y.o. female   1. Moderate episode of recurrent major depressive disorder (HCC) - ALPRAZolam (XANAX)  0.25 MG tablet; Take 1 Tablet by mouth 2 times a day as needed  Dispense: 60 tablet; Refill: 5  2. DDD (degenerative disc disease), lumbar - HYDROcodone-acetaminophen (NORCO) 10-325 MG tablet; Take 1 tablet by mouth every 6 (six) hours as needed.  Dispense: 120 tablet; Refill: 0  3. Bronchitis, acute, with bronchospasm - benzonatate (TESSALON PERLES) 100 MG capsule; Take 1 capsule (100 mg total) by mouth 3 (three) times daily as needed for cough.  Dispense: 60 capsule; Refill: 5   Continue all other maintenance medications as listed above.  Start time: 8:02 AM End time: 8:07 AM  Meds ordered this encounter  Medications  . ALPRAZolam (XANAX) 0.25 MG tablet    Sig: Take 1 Tablet by mouth 2 times a day as needed    Dispense:  60 tablet    Refill:  5    Order Specific Question:   Supervising Provider    Answer:   Raliegh IpGOTTSCHALK, [name] [[id number]]  . HYDROcodone-acetaminophen (NORCO) 10-325 MG tablet    Sig: Take 1 tablet by mouth every 6 (six) hours as needed.    Dispense:  120 tablet    Refill:  0    Order Specific Question:   Supervising Provider    Answer:   Raliegh IpGOTTSCHALK, [name] [[id number]]  . benzonatate (TESSALON PERLES) 100 MG capsule    Sig: Take 1 capsule (100 mg total) by mouth 3 (three) times daily as needed for cough.    Dispense:  60 capsule    Refill:  5    Order Specific Question:   Supervising Provider    Answer:   Raliegh IpGOTTSCHALK, [name] [[id number]]    Prudy FeelerAngel Lizvette Lightsey PA-C [facility] [phone]

## 2018-09-22 ENCOUNTER — Other Ambulatory Visit: Payer: Self-pay | Admitting: Physician Assistant

## 2018-10-06 ENCOUNTER — Other Ambulatory Visit: Payer: Self-pay | Admitting: Physician Assistant

## 2018-10-06 DIAGNOSIS — F331 Major depressive disorder, recurrent, moderate: Secondary | ICD-10-CM

## 2018-10-08 ENCOUNTER — Other Ambulatory Visit: Payer: Self-pay | Admitting: Physician Assistant

## 2018-10-19 ENCOUNTER — Other Ambulatory Visit: Payer: Self-pay

## 2018-10-19 ENCOUNTER — Telehealth: Payer: Self-pay | Admitting: Physician Assistant

## 2018-10-20 ENCOUNTER — Ambulatory Visit (INDEPENDENT_AMBULATORY_CARE_PROVIDER_SITE_OTHER): Payer: Medicare Other | Admitting: Physician Assistant

## 2018-10-20 ENCOUNTER — Encounter: Payer: Self-pay | Admitting: Physician Assistant

## 2018-10-20 DIAGNOSIS — J209 Acute bronchitis, unspecified: Secondary | ICD-10-CM | POA: Diagnosis not present

## 2018-10-20 DIAGNOSIS — M5136 Other intervertebral disc degeneration, lumbar region: Secondary | ICD-10-CM | POA: Diagnosis not present

## 2018-10-20 DIAGNOSIS — F331 Major depressive disorder, recurrent, moderate: Secondary | ICD-10-CM

## 2018-10-20 DIAGNOSIS — J440 Chronic obstructive pulmonary disease with acute lower respiratory infection: Secondary | ICD-10-CM | POA: Diagnosis not present

## 2018-10-20 MED ORDER — METOPROLOL TARTRATE 25 MG PO TABS: 25.0000 mg | ORAL_TABLET | Freq: Two times a day (BID) | ORAL | 11 refills | Status: DC

## 2018-10-20 MED ORDER — CITALOPRAM HYDROBROMIDE 40 MG PO TABS: 40.0000 mg | ORAL_TABLET | Freq: Every day | ORAL | 11 refills | Status: DC

## 2018-10-20 MED ORDER — OMEPRAZOLE 20 MG PO CPDR: 20.0000 mg | DELAYED_RELEASE_CAPSULE | Freq: Every day | ORAL | 11 refills | Status: DC

## 2018-10-20 MED ORDER — HYDROCODONE-ACETAMINOPHEN 10-325 MG PO TABS: 1.0000 | ORAL_TABLET | Freq: Four times a day (QID) | ORAL | 0 refills | Status: DC | PRN

## 2018-10-20 MED ORDER — FOLIC ACID 1 MG PO TABS: 1.0000 mg | ORAL_TABLET | Freq: Every day | ORAL | 3 refills | Status: DC

## 2018-10-20 NOTE — Progress Notes (Signed)
Telephone visit  Subjective: CC: Recheck on chronic conditions PCP: Remus LofflerJones, Ahaana Rochette S, PA-C ZOX:WRUEAVHPI:Betty Nelson is a 58 y.o. female calls for telephone consult today. Patient provides verbal consent for consult held via phone.  Patient is identified with 2 separate identifiers.  At this time the entire area is on COVID-19 social distancing and stay home orders are in place.  Patient is of higher risk and therefore we are performing this by a virtual method.  Location of patient: Home Location of provider: [facility] Others present for call: Daughter  The patient has COPD with chronic bronchitis.  She gave a cough.  He has had slight improvement with the albuterol being used regularly.  I hope in 1 month to be able to get her into the office to be seen.  She does need some refills on medications.  She does have known degenerative disc disease, depression and COPD.  Refills will be sent to the pharmacy.  As far as her pain medication does not do this in one prescription until we can see her again.  PDMP shows no abnormal behavior we will update her urine drug screen when she comes into the office next.  Her contract is up-to-date from 01/16/2018.  40 MME 1 LME   ROS: Per HPI  No Known Allergies Past Medical History:  Diagnosis Date  . Anxiety   . Bipolar disorder (HCC)   . Chronic lower back pain   . DDD (degenerative disc disease), lumbar   . Depression   . Disability of walking   . Headache    "about q wk" (10/09/2017)  . Hypertension   . Seizure (HCC) 10/05/2017   "that was my 1st" (10/09/2017)  . 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  Albuterol Sulfate (PROAIR RESPICLICK) 108 (90 Base) MCG/ACT AEPB, Inhale 1-2 puffs into the lungs every 6 (six) weeks. prn, Disp: 1 each, Rfl: 1 .  ALPRAZolam (XANAX) 0.25 MG tablet, Take 1 Tablet by mouth 2 times a day as needed, Disp: 60 tablet, Rfl: 5 .  benzonatate (TESSALON PERLES) 100 MG capsule, Take 1  capsule (100 mg total) by mouth 3 (three) times daily as needed for cough., Disp: 60 capsule, Rfl: 5 .  citalopram (CELEXA) 40 MG tablet, Take 1 tablet (40 mg total) by mouth daily., Disp: 30 tablet, Rfl: 11 .  cyclobenzaprine (FLEXERIL) 10 MG tablet, TAKE 1 TABLET BY MOUTH TWICE A DAY, Disp: 60 tablet, Rfl: 5 .  feeding supplement, ENSURE ENLIVE, (ENSURE ENLIVE) LIQD, Take 237 mLs by mouth 2 (two) times daily between meals., Disp: 60 Bottle, Rfl: 0 .  folic acid (FOLVITE) 1 MG tablet, Take 1 tablet (1 mg total) by mouth daily., Disp: 30 tablet, Rfl: 3 .  gabapentin (NEURONTIN) 100 MG capsule, Take 1-3 capsules (100-300 mg total) by mouth at bedtime., Disp: 90 capsule, Rfl: 3 .  HYDROcodone-acetaminophen (NORCO) 10-325 MG tablet, Take 1 tablet by mouth every 6 (six) hours as needed., Disp: 120 tablet, Rfl: 0 .  HYDROcodone-acetaminophen (NORCO) 10-325 MG tablet, Take 1 tablet by mouth every 6 (six) hours as needed., Disp: 120 tablet, Rfl: 0 .  HYDROcodone-acetaminophen (NORCO) 10-325 MG tablet, Take 1 tablet by mouth every 6 (six) hours as needed., Disp: 120 tablet, Rfl: 0 .  loratadine (CLARITIN) 10 MG tablet, Take 1 tablet (10 mg total) by mouth daily., Disp: 30 tablet, Rfl: 0 .  megestrol (MEGACE) 40 MG tablet, TAKE 1 TABLET BY MOUTH TWICE  A DAY, Disp: 60 tablet, Rfl: 5 .  meloxicam (MOBIC) 7.5 MG tablet, Take 1 tablet (7.5 mg total) by mouth daily., Disp: 30 tablet, Rfl: 11 .  metoprolol tartrate (LOPRESSOR) 25 MG tablet, Take 1 tablet (25 mg total) by mouth 2 (two) times daily., Disp: 60 tablet, Rfl: 11 .  omeprazole (PRILOSEC) 20 MG capsule, Take 1 capsule (20 mg total) by mouth daily., Disp: 30 capsule, Rfl: 11 .  SUMAtriptan (IMITREX) 100 MG tablet, Take 1 tablet (100 mg total) by mouth every 2 (two) hours as needed for migraine. May repeat in 2 hours if headache persists or recurs., Disp: 6 tablet, Rfl: 2 .  thiamine (VITAMIN B-1) 100 MG tablet, Take 1 tablet (100 mg total) by mouth daily.,  Disp: 30 tablet, Rfl: 0 .  traZODone (DESYREL) 300 MG tablet, Take 1 tablet (300 mg total) by mouth at bedtime., Disp: 30 tablet, Rfl: 11  Assessment/ Plan: 58 y.o. female   1. DDD (degenerative disc disease), lumbar - HYDROcodone-acetaminophen (NORCO) 10-325 MG tablet; Take 1 tablet by mouth every 6 (six) hours as needed.  Dispense: 120 tablet; Refill: 0  2. Acute bronchitis with COPD ([place]) Continue medications  3. Moderate episode of recurrent major depressive disorder (HCC) - citalopram (CELEXA) 40 MG tablet; Take 1 tablet (40 mg total) by mouth daily.  Dispense: 30 tablet; Refill: 11   No follow-ups on file.  Continue all other maintenance medications as listed above.  Start time: 10:24 AM End time: 10:32 AM  Meds ordered this encounter  Medications  . HYDROcodone-acetaminophen (NORCO) 10-325 MG tablet    Sig: Take 1 tablet by mouth every 6 (six) hours as needed.    Dispense:  120 tablet    Refill:  0    Order Specific Question:   Supervising Provider    Answer:   Janora Norlander [[id number]]  . metoprolol tartrate (LOPRESSOR) 25 MG tablet    Sig: Take 1 tablet (25 mg total) by mouth 2 (two) times daily.    Dispense:  60 tablet    Refill:  11    Order Specific Question:   Supervising Provider    Answer:   Janora Norlander [[id number]]  . omeprazole (PRILOSEC) 20 MG capsule    Sig: Take 1 capsule (20 mg total) by mouth daily.    Dispense:  30 capsule    Refill:  11    Order Specific Question:   Supervising Provider    Answer:   Janora Norlander [[id number]]  . folic acid (FOLVITE) 1 MG tablet    Sig: Take 1 tablet (1 mg total) by mouth daily.    Dispense:  30 tablet    Refill:  3    Order Specific Question:   Supervising Provider    Answer:   Janora Norlander [[id number]]  . citalopram (CELEXA) 40 MG tablet    Sig: Take 1 tablet (40 mg total) by mouth daily.    Dispense:  30 tablet    Refill:  11    Order Specific Question:   Supervising Provider    Answer:    Janora Norlander [[id number]]    Particia Nearing PA-C [place] [phone]

## 2018-10-31 DIAGNOSIS — I10 Essential (primary) hypertension: Secondary | ICD-10-CM | POA: Diagnosis not present

## 2018-10-31 DIAGNOSIS — Z209 Contact with and (suspected) exposure to unspecified communicable disease: Secondary | ICD-10-CM | POA: Diagnosis not present

## 2018-10-31 DIAGNOSIS — R079 Chest pain, unspecified: Secondary | ICD-10-CM | POA: Diagnosis not present

## 2018-10-31 DIAGNOSIS — R000 Tachycardia, unspecified: Secondary | ICD-10-CM | POA: Diagnosis not present

## 2018-11-18 ENCOUNTER — Ambulatory Visit (INDEPENDENT_AMBULATORY_CARE_PROVIDER_SITE_OTHER): Payer: Medicare Other | Admitting: Physician Assistant

## 2018-11-18 DIAGNOSIS — M5136 Other intervertebral disc degeneration, lumbar region: Secondary | ICD-10-CM | POA: Diagnosis not present

## 2018-11-18 MED ORDER — HYDROCODONE-ACETAMINOPHEN 10-325 MG PO TABS: 1.0000 | ORAL_TABLET | Freq: Four times a day (QID) | ORAL | 0 refills | Status: DC | PRN

## 2018-11-18 MED ORDER — GABAPENTIN 600 MG PO TABS: 600.0000 mg | ORAL_TABLET | Freq: Every day | ORAL | 2 refills | Status: DC

## 2018-11-22 ENCOUNTER — Encounter: Payer: Self-pay | Admitting: Physician Assistant

## 2018-11-22 NOTE — Progress Notes (Signed)
Telephone visit  Subjective: OJ:JKKX PCP: Terald Sleeper, PA-C FGH:WEXHBZ Betty Nelson is a 58 y.o. female calls for telephone consult today. Patient provides verbal consent for consult held via phone.  Patient is identified with 2 separate identifiers.  At this time the entire area is on COVID-19 social distancing and stay home orders are in place.  Patient is of higher risk and therefore we are performing this by a virtual method.  Location of patient: home Location of provider: [facility] Others present for call: daighter  This patient has known chronic COPD and severe her chronic cough.  Often she will just flareup whenever she is walking around.  We have discussed that she needs to use regularly.  This visit is also to discuss her chronic pain.  We will do 1 month of refill.  Hopefully she will be back in the office next month.  All of her medications are reviewed.  She is up-to-date.   ROS: Per HPI  No Known Allergies Past Medical History:  Diagnosis Date  . Anxiety   . Bipolar disorder ([place])   . Chronic lower back pain   . DDD (degenerative disc disease), lumbar   . Depression   . Disability of walking   . Headache    "about q wk" (10/09/2017)  . Hypertension   . Seizure ([place]) 10/05/2017   "that was my 1st" (10/09/2017)  . Todd's paralysis ([place]) 10/05/2017    Current Outpatient Medications:  .  Albuterol Sulfate (PROAIR RESPICLICK) 169 (90 Base) MCG/ACT AEPB, Inhale 1-2 puffs into the lungs every 6 (six) weeks. prn, Disp: 1 each, Rfl: 1 .  ALPRAZolam (XANAX) 0.25 MG tablet, Take 1 Tablet by mouth 2 times a day as needed, Disp: 60 tablet, Rfl: 5 .  benzonatate (TESSALON PERLES) 100 MG capsule, Take 1 capsule (100 mg total) by mouth 3 (three) times daily as needed for cough., Disp: 60 capsule, Rfl: 5 .  citalopram (CELEXA) 40 MG tablet, Take 1 tablet (40 mg total) by mouth daily., Disp: 30 tablet, Rfl: 11 .  cyclobenzaprine (FLEXERIL) 10 MG tablet, TAKE 1 TABLET BY MOUTH TWICE  A DAY, Disp: 60 tablet, Rfl: 5 .  feeding supplement, ENSURE ENLIVE, (ENSURE ENLIVE) LIQD, Take 237 mLs by mouth 2 (two) times daily between meals., Disp: 60 Bottle, Rfl: 0 .  folic acid (FOLVITE) 1 MG tablet, Take 1 tablet (1 mg total) by mouth daily., Disp: 30 tablet, Rfl: 3 .  gabapentin (NEURONTIN) 600 MG tablet, Take 1 tablet (600 mg total) by mouth at bedtime., Disp: 30 tablet, Rfl: 2 .  HYDROcodone-acetaminophen (NORCO) 10-325 MG tablet, Take 1 tablet by mouth every 6 (six) hours as needed., Disp: 120 tablet, Rfl: 0 .  HYDROcodone-acetaminophen (NORCO) 10-325 MG tablet, Take 1 tablet by mouth every 6 (six) hours as needed., Disp: 120 tablet, Rfl: 0 .  HYDROcodone-acetaminophen (NORCO) 10-325 MG tablet, Take 1 tablet by mouth every 6 (six) hours as needed., Disp: 120 tablet, Rfl: 0 .  loratadine (CLARITIN) 10 MG tablet, Take 1 tablet (10 mg total) by mouth daily., Disp: 30 tablet, Rfl: 0 .  megestrol (MEGACE) 40 MG tablet, TAKE 1 TABLET BY MOUTH TWICE A DAY, Disp: 60 tablet, Rfl: 5 .  meloxicam (MOBIC) 7.5 MG tablet, Take 1 tablet (7.5 mg total) by mouth daily., Disp: 30 tablet, Rfl: 11 .  metoprolol tartrate (LOPRESSOR) 25 MG tablet, Take 1 tablet (25 mg total) by mouth 2 (two) times daily., Disp: 60 tablet, Rfl: 11 .  omeprazole (PRILOSEC) 20 MG capsule, Take 1 capsule (20 mg total) by mouth daily., Disp: 30 capsule, Rfl: 11 .  SUMAtriptan (IMITREX) 100 MG tablet, Take 1 tablet (100 mg total) by mouth every 2 (two) hours as needed for migraine. May repeat in 2 hours if headache persists or recurs., Disp: 6 tablet, Rfl: 2 .  thiamine (VITAMIN B-1) 100 MG tablet, Take 1 tablet (100 mg total) by mouth daily., Disp: 30 tablet, Rfl: 0 .  traZODone (DESYREL) 300 MG tablet, Take 1 tablet (300 mg total) by mouth at bedtime., Disp: 30 tablet, Rfl: 11  Assessment/ Plan: 58 y.o. female   1. DDD (degenerative disc disease), lumbar - HYDROcodone-acetaminophen (NORCO) 10-325 MG tablet; Take 1 tablet by  mouth every 6 (six) hours as needed.  Dispense: 120 tablet; Refill: 0 - gabapentin (NEURONTIN) 600 MG tablet; Take 1 tablet (600 mg total) by mouth at bedtime.  Dispense: 30 tablet; Refill: 2   No follow-ups on file.  Continue all other maintenance medications as listed above.  Start time: 12:00 PM End time: 12:14 PM  Meds ordered this encounter  Medications  . HYDROcodone-acetaminophen (NORCO) 10-325 MG tablet    Sig: Take 1 tablet by mouth every 6 (six) hours as needed.    Dispense:  120 tablet    Refill:  0    Order Specific Question:   Supervising Provider    Answer:   Raliegh IpGOTTSCHALK, [name] [[id number]]  . gabapentin (NEURONTIN) 600 MG tablet    Sig: Take 1 tablet (600 mg total) by mouth at bedtime.    Dispense:  30 tablet    Refill:  2    Order Specific Question:   Supervising Provider    Answer:   Raliegh IpGOTTSCHALK, [name] [[id number]]    Prudy FeelerAngel Horatio Bertz PA-C [facility] [phone]

## 2018-11-26 ENCOUNTER — Telehealth: Payer: Self-pay | Admitting: Physician Assistant

## 2018-11-26 NOTE — Telephone Encounter (Signed)
Patient already has an appt and should not run out of medication until scheduled appt

## 2018-12-01 ENCOUNTER — Other Ambulatory Visit: Payer: Self-pay | Admitting: Physician Assistant

## 2018-12-01 DIAGNOSIS — F5101 Primary insomnia: Secondary | ICD-10-CM

## 2018-12-15 ENCOUNTER — Ambulatory Visit (INDEPENDENT_AMBULATORY_CARE_PROVIDER_SITE_OTHER): Payer: Medicare Other | Admitting: Physician Assistant

## 2018-12-15 ENCOUNTER — Encounter: Payer: Self-pay | Admitting: Physician Assistant

## 2018-12-15 DIAGNOSIS — M5136 Other intervertebral disc degeneration, lumbar region: Secondary | ICD-10-CM | POA: Diagnosis not present

## 2018-12-15 DIAGNOSIS — F331 Major depressive disorder, recurrent, moderate: Secondary | ICD-10-CM

## 2018-12-15 DIAGNOSIS — F5101 Primary insomnia: Secondary | ICD-10-CM | POA: Diagnosis not present

## 2018-12-15 MED ORDER — TRAZODONE HCL 300 MG PO TABS: ORAL_TABLET | ORAL | 11 refills | Status: DC

## 2018-12-15 MED ORDER — FLUOXETINE HCL 20 MG PO TABS: 20.0000 mg | ORAL_TABLET | Freq: Every day | ORAL | 3 refills | Status: DC

## 2018-12-15 MED ORDER — HYDROCODONE-ACETAMINOPHEN 10-325 MG PO TABS: 1.0000 | ORAL_TABLET | Freq: Four times a day (QID) | ORAL | 0 refills | Status: DC | PRN

## 2018-12-15 NOTE — Progress Notes (Signed)
Telephone visit  Subjective: WU:JWJXBJYCC:recheck pain and depresison PCP: Remus LofflerJones, Aleph Nickson S, PA-C NWG:NFAOZHHPI:Betty Nelson is a 58 y.o. female calls for telephone consult today. Patient provides verbal consent for consult held via phone.  Patient is identified with 2 separate identifiers.  At this time the entire area is on COVID-19 social distancing and stay home orders are in place.  Patient is of higher risk and therefore we are performing this by a virtual method.  Location of patient: home Location of provider: [facility] Others present for call: daughter  Due to covered restrictions the patient had a cough and is having to have a telephone visit.  She reports that overall she has been about the same.  She does have depression.  She states that her pet died this summer and she is just not been able to get over it.  She is currently on an antidepressant but she has never taken Prozac in the past.  I think we will switch over to this medicine and see if this will give her boost again.  Her PHQ score is positive.  We will send 1 month of her pain medication into the pharmacy and plan for her to come into the office in 4 weeks.  There are no other issues at this time.  [facility] 12/15/2018 04/21/2018 01/13/2018 11/11/2017 10/13/2017  Decreased Interest 1 3 3 3 2   Down, Depressed, Hopeless 1 3 3 3 2   PHQ - 2 Score 2 6 6 6 4   Altered sleeping 2 3 3 3 3   Tired, decreased energy 2 3 3 3 3   Change in appetite 2 1 1 1 1   Feeling bad or failure about yourself  2 3 3 3 3   Trouble concentrating 2 2 2 2 2   Moving slowly or fidgety/restless 2 0 0 3 0  Suicidal thoughts 1 0 0 0 0  PHQ-9 Score 15 18 18 21 16   Difficult doing work/chores Somewhat difficult - - - -  Some recent data might be hidden      ROS: Per HPI  No Known Allergies Past Medical History:  Diagnosis Date  . Anxiety   . Bipolar disorder (HCC)   . Chronic lower back pain   . DDD (degenerative disc disease), lumbar   . Depression    . Disability of walking   . Headache    "about q wk" (10/09/2017)  . Hypertension   . Seizure (HCC) 10/05/2017   "that was my 1st" (10/09/2017)  . 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  Albuterol Sulfate (PROAIR RESPICLICK) 108 (90 Base) MCG/ACT AEPB, Inhale 1-2 puffs into the lungs every 6 (six) weeks. prn, Disp: 1 each, Rfl: 1 .  ALPRAZolam (XANAX) 0.25 MG tablet, Take 1 Tablet by mouth 2 times a day as needed, Disp: 60 tablet, Rfl: 5 .  benzonatate (TESSALON PERLES) 100 MG capsule, Take 1 capsule (100 mg total) by mouth 3 (three) times daily as needed for cough., Disp: 60 capsule, Rfl: 5 .  citalopram (CELEXA) 40 MG tablet, Take 1 tablet (40 mg total) by mouth daily., Disp: 30 tablet, Rfl: 11 .  cyclobenzaprine (FLEXERIL) 10 MG tablet, TAKE 1 TABLET BY MOUTH TWICE A DAY, Disp: 60 tablet, Rfl: 5 .  feeding supplement, ENSURE ENLIVE, (ENSURE ENLIVE) LIQD, Take 237 mLs by mouth 2 (two) times daily between meals., Disp: 60 Bottle, Rfl: 0 .  folic acid (FOLVITE) 1 MG tablet, Take 1 tablet (  1 mg total) by mouth daily., Disp: 30 tablet, Rfl: 3 .  gabapentin (NEURONTIN) 600 MG tablet, Take 1 tablet (600 mg total) by mouth at bedtime., Disp: 30 tablet, Rfl: 2 .  HYDROcodone-acetaminophen (NORCO) 10-325 MG tablet, Take 1 tablet by mouth every 6 (six) hours as needed., Disp: 120 tablet, Rfl: 0 .  HYDROcodone-acetaminophen (NORCO) 10-325 MG tablet, Take 1 tablet by mouth every 6 (six) hours as needed., Disp: 120 tablet, Rfl: 0 .  HYDROcodone-acetaminophen (NORCO) 10-325 MG tablet, Take 1 tablet by mouth every 6 (six) hours as needed., Disp: 120 tablet, Rfl: 0 .  loratadine (CLARITIN) 10 MG tablet, Take 1 tablet (10 mg total) by mouth daily., Disp: 30 tablet, Rfl: 0 .  megestrol (MEGACE) 40 MG tablet, TAKE 1 TABLET BY MOUTH TWICE A DAY, Disp: 60 tablet, Rfl: 5 .  meloxicam (MOBIC) 7.5 MG tablet, Take 1 tablet (7.5 mg total) by mouth daily., Disp: 30 tablet, Rfl: 11 .   metoprolol tartrate (LOPRESSOR) 25 MG tablet, Take 1 tablet (25 mg total) by mouth 2 (two) times daily., Disp: 60 tablet, Rfl: 11 .  omeprazole (PRILOSEC) 20 MG capsule, Take 1 capsule (20 mg total) by mouth daily., Disp: 30 capsule, Rfl: 11 .  SUMAtriptan (IMITREX) 100 MG tablet, Take 1 tablet (100 mg total) by mouth every 2 (two) hours as needed for migraine. May repeat in 2 hours if headache persists or recurs., Disp: 6 tablet, Rfl: 2 .  thiamine (VITAMIN B-1) 100 MG tablet, Take 1 tablet (100 mg total) by mouth daily., Disp: 30 tablet, Rfl: 0 .  trazodone (DESYREL) 300 MG tablet, TAKE 1 TABLET BY MOUTH EVERYDAY AT BEDTIME, Disp: 30 tablet, Rfl: 0  Assessment/ Plan: 58 y.o. female   1. DDD (degenerative disc disease), lumbar - HYDROcodone-acetaminophen (NORCO) 10-325 MG tablet; Take 1 tablet by mouth every 6 (six) hours as needed.  Dispense: 120 tablet; Refill: 0  2. Primary insomnia - trazodone (DESYREL) 300 MG tablet; TAKE 1 TABLET BY MOUTH EVERYDAY AT BEDTIME  Dispense: 30 tablet; Refill: 11  3. Moderate episode of recurrent major depressive disorder (HCC) - FLUoxetine (PROZAC) 20 MG tablet; Take 1 tablet (20 mg total) by mouth daily.  Dispense: 30 tablet; Refill: 3   No follow-ups on file.  Continue all other maintenance medications as listed above.  Start time: 2:01 PM End time: 2:13 PM  No orders of the defined types were placed in this encounter.   Particia Nearing PA-C [place] [phone]

## 2019-01-13 ENCOUNTER — Ambulatory Visit (INDEPENDENT_AMBULATORY_CARE_PROVIDER_SITE_OTHER): Payer: Medicare Other | Admitting: Physician Assistant

## 2019-01-13 ENCOUNTER — Encounter: Payer: Self-pay | Admitting: Physician Assistant

## 2019-01-13 DIAGNOSIS — M5136 Other intervertebral disc degeneration, lumbar region: Secondary | ICD-10-CM

## 2019-01-13 DIAGNOSIS — F331 Major depressive disorder, recurrent, moderate: Secondary | ICD-10-CM

## 2019-01-13 MED ORDER — HYDROCODONE-ACETAMINOPHEN 10-325 MG PO TABS: 1.0000 | ORAL_TABLET | Freq: Four times a day (QID) | ORAL | 0 refills | Status: DC | PRN

## 2019-01-13 MED ORDER — FLUOXETINE HCL 20 MG PO CAPS: 20.0000 mg | ORAL_CAPSULE | Freq: Every day | ORAL | 5 refills | Status: DC

## 2019-01-13 NOTE — Progress Notes (Signed)
Telephone visit  Subjective: JA:SNKNLZJ chronic conditions PCP: Terald Sleeper, PA-C QBH:ALPFXT G Brigandi is a 58 y.o. female calls for telephone consult today. Patient provides verbal consent for consult held via phone.  Patient is identified with 2 separate identifiers.  At this time the entire area is on COVID-19 social distancing and stay home orders are in place.  Patient is of higher risk and therefore we are performing this by a virtual method.  Location of patient: home Location of provider: HOME Others present for call: Daughter [name]  Patient is having.  Recheck on her chronic medical conditions which do include severe dental disc disease, nerve damage, osteoarthritis, depression.  She has not been able to come in because of her COPD and cough.  However her cough is continued to be about the same for several months now.  The goal will have her come in at the next visit despite having her cough.  However if the cough change and she become sick she will let us know.  We will plan for her to come in at that time.  She will need an updated narcotic contract and toxassure drug screen.  They have found out that fluoxetine in the capsule form is much cheaper and on the [place] so we will send the prescription there for that.  We will refill her pain medication 1 time and plan to see her in a month.  I have encouraged her to try to get a flu vaccine at the pharmacy.  She is cash pay.  ROS: Per HPI  No Known Allergies Past Medical History:  Diagnosis Date  . Anxiety   . Bipolar disorder ([place])   . Chronic lower back pain   . DDD (degenerative disc disease), lumbar   . Depression   . Disability of walking   . Headache    "about q wk" (10/09/2017)  . Hypertension   . Seizure ([place]) 10/05/2017   "that was my 1st" (10/09/2017)  . Todd's paralysis ([place]) 10/05/2017    Current Outpatient Medications:  .  Albuterol Sulfate (PROAIR RESPICLICK) 024 (90 Base) MCG/ACT AEPB,  Inhale 1-2 puffs into the lungs every 6 (six) weeks. prn, Disp: 1 each, Rfl: 1 .  ALPRAZolam (XANAX) 0.25 MG tablet, Take 1 Tablet by mouth 2 times a day as needed, Disp: 60 tablet, Rfl: 5 .  benzonatate (TESSALON PERLES) 100 MG capsule, Take 1 capsule (100 mg total) by mouth 3 (three) times daily as needed for cough., Disp: 60 capsule, Rfl: 5 .  cyclobenzaprine (FLEXERIL) 10 MG tablet, TAKE 1 TABLET BY MOUTH TWICE A DAY, Disp: 60 tablet, Rfl: 5 .  feeding supplement, ENSURE ENLIVE, (ENSURE ENLIVE) LIQD, Take 237 mLs by mouth 2 (two) times daily between meals., Disp: 60 Bottle, Rfl: 0 .  FLUoxetine (PROZAC) 20 MG capsule, Take 1 capsule (20 mg total) by mouth daily., Disp: 30 capsule, Rfl: 5 .  folic acid (FOLVITE) 1 MG tablet, Take 1 tablet (1 mg total) by mouth daily., Disp: 30 tablet, Rfl: 3 .  gabapentin (NEURONTIN) 600 MG tablet, Take 1 tablet (600 mg total) by mouth at bedtime., Disp: 30 tablet, Rfl: 2 .  HYDROcodone-acetaminophen (NORCO) 10-325 MG tablet, Take 1 tablet by mouth every 6 (six) hours as needed., Disp: 120 tablet, Rfl: 0 .  HYDROcodone-acetaminophen (NORCO) 10-325 MG tablet, Take 1 tablet by mouth every 6 (six) hours as needed., Disp: 120 tablet, Rfl: 0 .  HYDROcodone-acetaminophen (NORCO) 10-325 MG tablet, Take 1  tablet by mouth every 6 (six) hours as needed., Disp: 120 tablet, Rfl: 0 .  loratadine (CLARITIN) 10 MG tablet, Take 1 tablet (10 mg total) by mouth daily., Disp: 30 tablet, Rfl: 0 .  megestrol (MEGACE) 40 MG tablet, TAKE 1 TABLET BY MOUTH TWICE A DAY, Disp: 60 tablet, Rfl: 5 .  meloxicam (MOBIC) 7.5 MG tablet, Take 1 tablet (7.5 mg total) by mouth daily., Disp: 30 tablet, Rfl: 11 .  metoprolol tartrate (LOPRESSOR) 25 MG tablet, Take 1 tablet (25 mg total) by mouth 2 (two) times daily., Disp: 60 tablet, Rfl: 11 .  omeprazole (PRILOSEC) 20 MG capsule, Take 1 capsule (20 mg total) by mouth daily., Disp: 30 capsule, Rfl: 11 .  SUMAtriptan (IMITREX) 100 MG tablet, Take 1  tablet (100 mg total) by mouth every 2 (two) hours as needed for migraine. May repeat in 2 hours if headache persists or recurs., Disp: 6 tablet, Rfl: 2 .  thiamine (VITAMIN B-1) 100 MG tablet, Take 1 tablet (100 mg total) by mouth daily., Disp: 30 tablet, Rfl: 0 .  trazodone (DESYREL) 300 MG tablet, TAKE 1 TABLET BY MOUTH EVERYDAY AT BEDTIME, Disp: 30 tablet, Rfl: 11  Assessment/ Plan: 58 y.o. female   1. DDD (degenerative disc disease), lumbar - HYDROcodone-acetaminophen (NORCO) 10-325 MG tablet; Take 1 tablet by mouth every 6 (six) hours as needed.  Dispense: 120 tablet; Refill: 0 - ToxASSURE [facility] 13 (MW), Urine  2. Moderate episode of recurrent major depressive disorder (HCC) - FLUoxetine (PROZAC) 20 MG capsule; Take 1 capsule (20 mg total) by mouth daily.  Dispense: 30 capsule; Refill: 5   Return in about 4 weeks (around 02/10/2019).  Continue all other maintenance medications as listed above.  Start time: 11:08 AM End time: 11:19 AM  Meds ordered this encounter  Medications  . FLUoxetine (PROZAC) 20 MG capsule    Sig: Take 1 capsule (20 mg total) by mouth daily.    Dispense:  30 capsule    Refill:  5    Order Specific Question:   Supervising Provider    Answer:   [name] [[id number]]  . HYDROcodone-acetaminophen (NORCO) 10-325 MG tablet    Sig: Take 1 tablet by mouth every 6 (six) hours as needed.    Dispense:  120 tablet    Refill:  0    Order Specific Question:   Supervising Provider    Answer:   [name] [[id number]]    Prudy Feeler PA-C [facility] [phone]

## 2019-02-15 ENCOUNTER — Other Ambulatory Visit: Payer: Self-pay | Admitting: Physician Assistant

## 2019-02-15 NOTE — Progress Notes (Signed)
tox 

## 2019-02-16 ENCOUNTER — Encounter: Payer: Self-pay | Admitting: Physician Assistant

## 2019-02-16 ENCOUNTER — Other Ambulatory Visit: Payer: Self-pay

## 2019-02-16 ENCOUNTER — Ambulatory Visit (INDEPENDENT_AMBULATORY_CARE_PROVIDER_SITE_OTHER): Payer: Medicare Other | Admitting: Physician Assistant

## 2019-02-16 VITALS — BP 122/76 | HR 99 | Temp 99.3°F | Ht 63.0 in | Wt 109.6 lb

## 2019-02-16 DIAGNOSIS — F331 Major depressive disorder, recurrent, moderate: Secondary | ICD-10-CM

## 2019-02-16 DIAGNOSIS — M51369 Other intervertebral disc degeneration, lumbar region without mention of lumbar back pain or lower extremity pain: Secondary | ICD-10-CM

## 2019-02-16 DIAGNOSIS — R569 Unspecified convulsions: Secondary | ICD-10-CM | POA: Diagnosis not present

## 2019-02-16 DIAGNOSIS — M5136 Other intervertebral disc degeneration, lumbar region: Secondary | ICD-10-CM

## 2019-02-16 DIAGNOSIS — F419 Anxiety disorder, unspecified: Secondary | ICD-10-CM

## 2019-02-16 MED ORDER — HYDROCODONE-ACETAMINOPHEN 10-325 MG PO TABS: 1.0000 | ORAL_TABLET | Freq: Four times a day (QID) | ORAL | 0 refills | Status: DC | PRN

## 2019-02-16 MED ORDER — ALPRAZOLAM 0.25 MG PO TABS: ORAL_TABLET | ORAL | 2 refills | Status: DC

## 2019-02-16 NOTE — Patient Instructions (Addendum)
Month 2 1/2 tab twice daily Month 3 1/2 tab daily Month 4 off but only as needed

## 2019-02-17 LAB — CMP14+EGFR
ALT: 13 IU/L (ref 0–32)
AST: 20 IU/L (ref 0–40)
Albumin/Globulin Ratio: 1.6 (ref 1.2–2.2)
Albumin: 4.4 g/dL (ref 3.8–4.9)
Alkaline Phosphatase: 61 IU/L (ref 39–117)
BUN/Creatinine Ratio: 10 — ABNORMAL LOW (ref 12–28)
BUN: 8 mg/dL (ref 8–27)
Bilirubin Total: 0.3 mg/dL (ref 0.0–1.2)
CO2: 20 mmol/L (ref 20–29)
Calcium: 9.4 mg/dL (ref 8.7–10.3)
Chloride: 103 mmol/L (ref 96–106)
Creatinine, Ser: 0.79 mg/dL (ref 0.57–1.00)
GFR calc Af Amer: 94 mL/min/{1.73_m2} (ref >59)
GFR calc non Af Amer: 82 mL/min/{1.73_m2} (ref >59)
Globulin, Total: 2.8 g/dL (ref 1.5–4.5)
Glucose: 90 mg/dL (ref 65–99)
Potassium: 3.7 mmol/L (ref 3.5–5.2)
Sodium: 138 mmol/L (ref 134–144)
Total Protein: 7.2 g/dL (ref 6.0–8.5)

## 2019-02-17 LAB — CBC WITH DIFFERENTIAL/PLATELET
Basophils Absolute: 0 10*3/uL (ref 0.0–0.2)
Basos: 1 %
EOS (ABSOLUTE): 0.1 10*3/uL (ref 0.0–0.4)
Eos: 1 %
Hematocrit: 38.3 % (ref 34.0–46.6)
Hemoglobin: 13 g/dL (ref 11.1–15.9)
Immature Grans (Abs): 0 10*3/uL (ref 0.0–0.1)
Immature Granulocytes: 0 %
Lymphocytes Absolute: 2.5 10*3/uL (ref 0.7–3.1)
Lymphs: 34 %
MCH: 29.9 pg (ref 26.6–33.0)
MCHC: 33.9 g/dL (ref 31.5–35.7)
MCV: 88 fL (ref 79–97)
Monocytes Absolute: 0.5 10*3/uL (ref 0.1–0.9)
Monocytes: 7 %
Neutrophils Absolute: 4.3 10*3/uL (ref 1.4–7.0)
Neutrophils: 57 %
Platelets: 447 10*3/uL (ref 150–450)
RBC: 4.35 x10E6/uL (ref 3.77–5.28)
RDW: 15 % (ref 11.7–15.4)
WBC: 7.4 10*3/uL (ref 3.4–10.8)

## 2019-02-18 LAB — TOXASSURE SELECT 13 (MW), URINE

## 2019-02-18 NOTE — Progress Notes (Addendum)
BP 122/76   Pulse 99   Temp 99.3 F (37.4 C) (Temporal)   Ht 5' 3" (1.6 m)   Wt 109 lb 9.6 oz (49.7 kg)   SpO2 99%   BMI 19.41 kg/m    Subjective:    Patient ID: Betty Nelson, female    DOB: 05-10-1960, 58 y.o.   MRN: [id number]  HPI: Betty Nelson is a 58 y.o. female presenting on 02/16/2019 for Pain  Her daughter reports that she may have had a seizure-like activity in the recent week.  She has had a seizure in the past.  She was not placed on any type of medication.  They felt that it was related to an illness.  She does have a history of alcohol abuse.  But reports that she has not been having any alcohol for quite some time.  She also has a history of depression, anxiety, chronic low back pain.  PAIN ASSESSMENT: Cause of pain- DDD  This patient returns for a 3 month recheck on narcotic use for the above named conditions  Current medications-hydrocodone 10/325 1 every 6 hours as needed for severe pain Gabapentin 600 mg at bedtime Still continuing to titrate down by half a tablet each month. Medication side effects- none  Pain on scale of 1-10- 7 Frequency- daily What increases pain- walking What makes pain Better- rest Effects on ADL - moderate Any change in general medical condition- no  Effectiveness of current meds- good Adverse reactions form pain meds-no PMP AWARE website reviewed: Yes Any suspicious activity on PMP Aware: No MME daily dose: 10  Contract on file 02/16/19 Last UDS  02/16/19    ANXIETY ASSESSMENT Cause of anxiety: GAD This patient returns for a  month recheck on narcotic use for the above named condition(s)  Current medications- alprazolam 0.25 mg 1 twice daily Prozac 20 mg 1 daily Month 1 1 tab AM, 1/2 PM Month 2 1/2 tab twice daily Month 3 1/2 tab daily Month 4 off but only as needed  Medication side effects- no Any concerns- no Any change in general medical condition- no Effectiveness of current meds- good PMP AWARE  website reviewed: Yes Any suspicious activity on PMP Aware: No LME daily dose: 1  Contract on file 02/16/19 Last UDS  02/16/19     Past Medical History:  Diagnosis Date  . Anxiety   . Bipolar disorder ([place])   . Chronic lower back pain   . DDD (degenerative disc disease), lumbar   . Depression   . Disability of walking   . Headache    "about q wk" (10/09/2017)  . Hypertension   . Seizure ([place]) 10/05/2017   "that was my 1st" (10/09/2017)  . 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 Interim medical history since our last visit reviewed. Allergies and medications reviewed and updated. DATA REVIEWED: CHART IN EPIC  Family History reviewed for pertinent findings.  Review of Systems  Constitutional: Negative.  Negative for activity change, fatigue and fever.  HENT: Negative.   Eyes: Negative.   Respiratory: Negative.  Negative for cough.   Cardiovascular: Negative.  Negative for chest pain.  Gastrointestinal: Negative.  Negative for abdominal pain.  Endocrine: Negative.   Genitourinary: Negative.  Negative for dysuria.  Musculoskeletal: Positive for arthralgias and back pain.  Skin: Negative.   Neurological: Negative.     Allergies as of 02/16/2019   No Known Allergies  Medication List       Accurate as of February 16, 2019 11:59 PM. If you have any questions, ask your nurse or doctor.        STOP taking these medications   benzonatate 100 MG capsule Commonly known as: Best boy Stopped by: Terald Sleeper, PA-C   cyclobenzaprine 10 MG tablet Commonly known as: FLEXERIL Stopped by: Terald Sleeper, PA-C   feeding supplement (ENSURE ENLIVE) Liqd Stopped by: Terald Sleeper, PA-C   meloxicam 7.5 MG tablet Commonly known as: MOBIC Stopped by: Terald Sleeper, PA-C     TAKE these medications   Albuterol Sulfate 108 (90 Base) MCG/ACT Aepb Commonly known as: ProAir  RespiClick Inhale 1-2 puffs into the lungs every 6 (six) weeks. prn   ALPRAZolam 0.25 MG tablet Commonly known as: XANAX Take 1/2 Tablet QAM and 1 tab QPM What changed: additional instructions Changed by: Terald Sleeper, PA-C   FLUoxetine 20 MG capsule Commonly known as: PROZAC Take 1 capsule (20 mg total) by mouth daily.   folic acid 1 MG tablet Commonly known as: FOLVITE Take 1 tablet (1 mg total) by mouth daily.   gabapentin 600 MG tablet Commonly known as: Neurontin Take 1 tablet (600 mg total) by mouth at bedtime.   HYDROcodone-acetaminophen 10-325 MG tablet Commonly known as: NORCO Take 1 tablet by mouth every 6 (six) hours as needed.   HYDROcodone-acetaminophen 10-325 MG tablet Commonly known as: NORCO Take 1 tablet by mouth every 6 (six) hours as needed.   HYDROcodone-acetaminophen 10-325 MG tablet Commonly known as: NORCO Take 1 tablet by mouth every 6 (six) hours as needed.   loratadine 10 MG tablet Commonly known as: CLARITIN Take 1 tablet (10 mg total) by mouth daily.   megestrol 40 MG tablet Commonly known as: MEGACE TAKE 1 TABLET BY MOUTH TWICE A DAY   metoprolol tartrate 25 MG tablet Commonly known as: LOPRESSOR Take 1 tablet (25 mg total) by mouth 2 (two) times daily.   omeprazole 20 MG capsule Commonly known as: PRILOSEC Take 1 capsule (20 mg total) by mouth daily.   SUMAtriptan 100 MG tablet Commonly known as: Imitrex Take 1 tablet (100 mg total) by mouth every 2 (two) hours as needed for migraine. May repeat in 2 hours if headache persists or recurs.   thiamine 100 MG tablet Commonly known as: VITAMIN B-1 Take 1 tablet (100 mg total) by mouth daily.   trazodone 300 MG tablet Commonly known as: DESYREL TAKE 1 TABLET BY MOUTH EVERYDAY AT BEDTIME          Objective:    BP 122/76   Pulse 99   Temp 99.3 F (37.4 C) (Temporal)   Ht 5' 3" (1.6 m)   Wt 109 lb 9.6 oz (49.7 kg)   SpO2 99%   BMI 19.41 kg/m   No Known Allergies  Wt  Readings from Last 3 Encounters:  02/16/19 109 lb 9.6 oz (49.7 kg)  04/21/18 123 lb (55.8 kg)  01/13/18 121 lb 3.2 oz (55 kg)    Physical Exam Constitutional:      General: She is not in acute distress.    Appearance: Normal appearance. She is well-developed.  HENT:     Head: Normocephalic and atraumatic.  Cardiovascular:     Rate and Rhythm: Normal rate.  Pulmonary:     Effort: Pulmonary effort is normal.  Skin:    General: Skin is warm and dry.     Findings: No rash.  Neurological:     Mental Status: She is alert and oriented to person, place, and time.     Deep Tendon Reflexes: Reflexes are normal and symmetric.         Assessment & Plan:   1. Moderate episode of recurrent major depressive disorder (HCC) - DRUG SCREEN-TOXASSURE - ALPRAZolam (XANAX) 0.25 MG tablet; Take 1/2 Tablet QAM and 1 tab QPM  Dispense: 45 tablet; Refill: 2  2. DDD (degenerative disc disease), lumbar - DRUG SCREEN-TOXASSURE - HYDROcodone-acetaminophen (NORCO) 10-325 MG tablet; Take 1 tablet by mouth every 6 (six) hours as needed.  Dispense: 120 tablet; Refill: 0 - HYDROcodone-acetaminophen (NORCO) 10-325 MG tablet; Take 1 tablet by mouth every 6 (six) hours as needed.  Dispense: 120 tablet; Refill: 0 - HYDROcodone-acetaminophen (NORCO) 10-325 MG tablet; Take 1 tablet by mouth every 6 (six) hours as needed.  Dispense: 120 tablet; Refill: 0  3. Anxiety Titration plan Month 1 1 and 1/2 tab max Month 2 1/2 tab twice daily Month 3 1/2 tab daily Month 4 off but only as needed  4. Seizure-like activity (HCC) - CBC with Differential/Platelet - CMP14+EGFR   Continue all other maintenance medications as listed above.  Follow up plan: Return in about 3 months (around 05/19/2019) for follow up pain meds.  Educational handout given for instruction for titration  Terald Sleeper PA-C [facility] [address]  [place], [place] [address] [phone]   02/18/2019, 3:09 PM

## 2019-02-18 NOTE — Addendum Note (Signed)
Addended by: Terald Sleeper on: 02/18/2019 03:22 PM   Modules accepted: Orders

## 2019-03-02 ENCOUNTER — Other Ambulatory Visit: Payer: Self-pay | Admitting: Physician Assistant

## 2019-03-02 DIAGNOSIS — M5136 Other intervertebral disc degeneration, lumbar region: Secondary | ICD-10-CM

## 2019-03-11 ENCOUNTER — Other Ambulatory Visit: Payer: Self-pay | Admitting: Physician Assistant

## 2019-04-29 ENCOUNTER — Telehealth: Payer: Self-pay | Admitting: Physician Assistant

## 2019-04-29 NOTE — Telephone Encounter (Signed)
Pt was asking why she didn't have any available refills on her pain medication. Advised pt she has an appt with AJ 05/26/19 for pain follow up and that would be when she would get her medication as she has to be seen every 3 months for that. Pt voiced understanding.

## 2019-04-29 NOTE — Telephone Encounter (Signed)
What is the name of the medication? All meds  Have you contacted your pharmacy to request a refill? No  Which pharmacy would you like this sent to? [facility]   Patient notified that their request is being sent to the clinical staff for review and that they should receive a call once it is complete. If they do not receive a call within 24 hours they can check with their pharmacy or our office.   Yetta Barre' pt  She needs all her meds.  Please call her.

## 2019-05-26 ENCOUNTER — Telehealth (INDEPENDENT_AMBULATORY_CARE_PROVIDER_SITE_OTHER): Payer: Medicare Other | Admitting: Physician Assistant

## 2019-05-26 DIAGNOSIS — F331 Major depressive disorder, recurrent, moderate: Secondary | ICD-10-CM

## 2019-05-26 DIAGNOSIS — M5136 Other intervertebral disc degeneration, lumbar region: Secondary | ICD-10-CM

## 2019-05-26 DIAGNOSIS — J42 Unspecified chronic bronchitis: Secondary | ICD-10-CM | POA: Diagnosis not present

## 2019-05-26 DIAGNOSIS — F419 Anxiety disorder, unspecified: Secondary | ICD-10-CM

## 2019-05-26 DIAGNOSIS — K219 Gastro-esophageal reflux disease without esophagitis: Secondary | ICD-10-CM | POA: Diagnosis not present

## 2019-05-26 MED ORDER — PROAIR RESPICLICK 108 (90 BASE) MCG/ACT IN AEPB: 1.0000 | INHALATION_SPRAY | RESPIRATORY_TRACT | 1 refills | Status: DC

## 2019-05-26 MED ORDER — MELOXICAM 7.5 MG PO TABS: 7.5000 mg | ORAL_TABLET | Freq: Every day | ORAL | 5 refills | Status: DC

## 2019-05-26 MED ORDER — OMEPRAZOLE 20 MG PO CPDR: 20.0000 mg | DELAYED_RELEASE_CAPSULE | Freq: Every day | ORAL | 3 refills | Status: DC

## 2019-05-26 MED ORDER — HYDROXYZINE HCL 25 MG PO TABS: 25.0000 mg | ORAL_TABLET | Freq: Three times a day (TID) | ORAL | 2 refills | Status: DC | PRN

## 2019-05-26 NOTE — Progress Notes (Signed)
Video visit  Subjective: CC: Recheck on chronic medical conditions PCP: Terald Sleeper, PA-C ZSW:[id number] Betty Nelson is a 59 y.o. female calls for video visit today. Patient provides verbal consent for consult held via phone.  Patient is identified with 2 separate identifiers.  At this time the entire area is on COVID-19 social distancing and stay home orders are in place.  Patient is of higher risk and therefore we are performing this by a virtual method.  Location of patient: Home Location of provider: [facility] Others present for call: Daughter  Patient tells follow-up on her chronic medical conditions which do include degenerative disc disease, depression with anxiety, GERD, history of smoking and occasional bronchitis.  All of her medications are reviewed.  She states she does need a refill on her albuterol inhaler and we will send that.  She has had to stop the alprazolam because of her drugs being correct so we will give a trial of Atarax at this time to see if that can help with some of her anxiety.  She will continue her Prozac for her depression and anxiety.  Back pain Patient has known degenerative disc disease again we had to stop any controlled pain medication but she is continuing on gabapentin 600 mg at bedtime, meloxicam 7.5 mg, she had asked about a muscle relaxant.  I would like for her to try these medicines can consistently for the next 2 weeks and she call and let us know if it is not better we will consider a muscle relaxant then.   ROS: Per HPI  No Known Allergies Past Medical History:  Diagnosis Date  . Anxiety   . Bipolar disorder ([place])   . Chronic lower back pain   . DDD (degenerative disc disease), lumbar   . Depression   . Disability of walking   . Headache    "about q wk" (10/09/2017)  . Hypertension   . Seizure ([place]) 10/05/2017   "that was my 1st" (10/09/2017)  . 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  Albuterol  Sulfate (PROAIR RESPICLICK) 557 (90 Base) MCG/ACT AEPB, Inhale 1-2 puffs into the lungs every 6 (six) weeks. prn, Disp: 1 each, Rfl: 1 .  FLUoxetine (PROZAC) 20 MG capsule, Take 1 capsule (20 mg total) by mouth daily., Disp: 30 capsule, Rfl: 5 .  gabapentin (NEURONTIN) 600 MG tablet, TAKE 1 TABLET (600 MG TOTAL) BY MOUTH AT BEDTIME., Disp: 30 tablet, Rfl: 2 .  hydrOXYzine (ATARAX/VISTARIL) 25 MG tablet, Take 1 tablet (25 mg total) by mouth every 8 (eight) hours as needed for anxiety., Disp: 60 tablet, Rfl: 2 .  megestrol (MEGACE) 40 MG tablet, TAKE 1 TABLET BY MOUTH TWICE A DAY, Disp: 60 tablet, Rfl: 5 .  meloxicam (MOBIC) 7.5 MG tablet, Take 1 tablet (7.5 mg total) by mouth daily. arthritis, Disp: 30 tablet, Rfl: 5 .  metoprolol tartrate (LOPRESSOR) 25 MG tablet, Take 1 tablet (25 mg total) by mouth 2 (two) times daily., Disp: 60 tablet, Rfl: 11 .  omeprazole (PRILOSEC) 20 MG capsule, Take 1 capsule (20 mg total) by mouth daily. heartburn, Disp: 30 capsule, Rfl: 3 .  SUMAtriptan (IMITREX) 100 MG tablet, Take 1 tablet (100 mg total) by mouth every 2 (two) hours as needed for migraine. May repeat in 2 hours if headache persists or recurs., Disp: 6 tablet, Rfl: 2 .  trazodone (DESYREL) 300 MG tablet, TAKE 1 TABLET BY MOUTH EVERYDAY AT BEDTIME, Disp: 30  tablet, Rfl: 11  Assessment/ Plan: 59 y.o. female  1. Chronic bronchitis, unspecified chronic bronchitis type (HCC) - Albuterol Sulfate (PROAIR RESPICLICK) 108 (90 Base) MCG/ACT AEPB; Inhale 1-2 puffs into the lungs every 6 (six) weeks. prn  Dispense: 1 each; Refill: 1  2. DDD (degenerative disc disease), lumbar - meloxicam (MOBIC) 7.5 MG tablet; Take 1 tablet (7.5 mg total) by mouth daily. arthritis  Dispense: 30 tablet; Refill: 5  3. Moderate episode of recurrent major depressive disorder (HCC) Continue fluoxetine 20 mg  4. Anxiety - hydrOXYzine (ATARAX/VISTARIL) 25 MG tablet; Take 1 tablet (25 mg total) by mouth every 8 (eight) hours as needed  for anxiety.  Dispense: 60 tablet; Refill: 2  5. Gastroesophageal reflux disease without esophagitis - omeprazole (PRILOSEC) 20 MG capsule; Take 1 capsule (20 mg total) by mouth daily. heartburn  Dispense: 30 capsule; Refill: 3    No follow-ups on file.  Continue all other maintenance medications as listed above.  Start time: 12:37 PM End time: 12:57 PM  Meds ordered this encounter  Medications  . Albuterol Sulfate (PROAIR RESPICLICK) 108 (90 Base) MCG/ACT AEPB    Sig: Inhale 1-2 puffs into the lungs every 6 (six) weeks. prn    Dispense:  1 each    Refill:  1    Order Specific Question:   Supervising Provider    Answer:   Melford Aase  . hydrOXYzine (ATARAX/VISTARIL) 25 MG tablet    Sig: Take 1 tablet (25 mg total) by mouth every 8 (eight) hours as needed for anxiety.    Dispense:  60 tablet    Refill:  2    Order Specific Question:   Supervising Provider    Answer:   Melford Aase  . omeprazole (PRILOSEC) 20 MG capsule    Sig: Take 1 capsule (20 mg total) by mouth daily. heartburn    Dispense:  30 capsule    Refill:  3    Order Specific Question:   Supervising Provider    Answer:   Melford Aase  . meloxicam (MOBIC) 7.5 MG tablet    Sig: Take 1 tablet (7.5 mg total) by mouth daily. arthritis    Dispense:  30 tablet    Refill:  5    Order Specific Question:   Supervising Provider    Answer:   Remus Loffler [[id number]]    Prudy Feeler PA-C [facility] [phone]

## 2019-05-31 ENCOUNTER — Other Ambulatory Visit: Payer: Self-pay | Admitting: Physician Assistant

## 2019-05-31 ENCOUNTER — Encounter: Payer: Self-pay | Admitting: Physician Assistant

## 2019-06-15 ENCOUNTER — Other Ambulatory Visit: Payer: Self-pay | Admitting: Physician Assistant

## 2019-06-15 DIAGNOSIS — F331 Major depressive disorder, recurrent, moderate: Secondary | ICD-10-CM

## 2019-06-16 ENCOUNTER — Other Ambulatory Visit: Payer: Self-pay | Admitting: Physician Assistant

## 2019-06-16 DIAGNOSIS — M5136 Other intervertebral disc degeneration, lumbar region: Secondary | ICD-10-CM

## 2019-07-02 ENCOUNTER — Ambulatory Visit: Payer: Medicare Other

## 2019-07-02 ENCOUNTER — Telehealth: Payer: Self-pay | Admitting: Physician Assistant

## 2019-07-02 ENCOUNTER — Telehealth: Payer: Self-pay | Admitting: *Deleted

## 2019-07-02 NOTE — Telephone Encounter (Signed)
Appt made [facility] aware

## 2019-07-02 NOTE — Chronic Care Management (AMB) (Signed)
  Chronic Care Management   Note  07/02/2019 Name: Betty Nelson MRN: [id number] DOB: 04/10/1960  Betty Nelson is a 59 y.o. year old female who is a primary care patient of Theodoro Clock. I reached out to Short by phone today in response to a referral sent by Betty Nelson's health plan.     Betty Nelson daughter Betty Nelson was given information about Chronic Care Management services today including:  1. CCM service includes personalized support from designated clinical staff supervised by her physician, including individualized plan of care and coordination with other care providers 2. 24/7 contact phone numbers for assistance for urgent and routine care needs. 3. Service will only be billed when office clinical staff spend 20 minutes or more in a month to coordinate care. 4. Only one practitioner may furnish and bill the service in a calendar month. 5. The patient may stop CCM services at any time (effective at the end of the month) by phone call to the office staff. 6. The patient will be responsible for cost sharing (co-pay) of up to 20% of the service fee (after annual deductible is met).  Patient's daughter Betty Nelson agreed to services and verbal consent obtained.   Follow up plan: Telephone appointment with care management team member scheduled for:02/04/2020  Betty Nelson, [place], [place], [place]  [address] Direct Dial: [phone] [email or web address] Website: [email or web address]

## 2019-07-02 NOTE — Chronic Care Management (AMB) (Signed)
  Chronic Care Management   Note  07/02/2019 Name: Sarinah Doetsch Motsinger MRN: [id number] DOB: 02-11-1961  RN Care Plan   . Syncopal Episdoes       CARE PLAN ENTRY (see longtitudinal plan of care for additional care plan information)  Syncopal episodes in patient with HTN  Current Barriers:  Marland Kitchen Knowledge Deficits related to syncopal episodes . Corporate treasurer.   Nurse Case Manager Clinical Goal(s):  Marland Kitchen Over the next 5 days, patient will work with PCP office regarding syncopal episodes  Interventions:  . Consulted by Care Guide after patient/daughter was contacted regarding CCM services o Patient declined neurology referral due to concerns about cost o Patient would now like to see neurologist . Chart reviewed o Referral to neurologist was created in 02/2019 o Last office visit was 05/2019 and syncopal episodes weren't discussed . Communication sent to Dr Nadine Counts for review and recommendation . RN will f/u during scheduled Initial Visit or sooner if necessary  Patient Self Care Activities:  . Unknown to me at this time  Initial goal documentation         Follow up plan: PCP office will f/u with patient regarding referral. Appt scheduled for 07/19/19.  RN will talk with patient during Initial Visit appointment on 02/04/20 or sooner if necessary  Demetrios Loll, BSN, RN-BC Embedded Chronic Care Manager [facility] / [facility] Care Management Direct Dial: [phone]

## 2019-07-02 NOTE — Patient Instructions (Signed)
Visit Information  Goals Addressed            This Visit's Progress   . Syncopal Episdoes       CARE PLAN ENTRY (see longtitudinal plan of care for additional care plan information)  Syncopal episodes in patient with HTN  Current Barriers:  Marland Kitchen Knowledge Deficits related to syncopal episodes . Corporate treasurer.   Nurse Case Manager Clinical Goal(s):  Marland Kitchen Over the next 5 days, patient will work with PCP office regarding syncopal episodes  Interventions:  . Consulted by Care Guide after patient/daughter was contacted regarding CCM services o Patient declined neurology referral due to concerns about cost o Patient would now like to see neurologist . Chart reviewed o Referral to neurologist was created in 02/2019 o Last office visit was 05/2019 and syncopal episodes weren't discussed . Communication sent to Dr Nadine Counts for review and recommendation . RN will f/u during scheduled Initial Visit or sooner if necessary  Patient Self Care Activities:  . Unknown to me at this time  Initial goal documentation       Follow-up Plan Appointment with Dr Nadine Counts on 07/19/19 Appt with RN Care Manager 02/04/20  Demetrios Loll, BSN, RN-BC Embedded Chronic Care Manager [facility] / [facility] Care Management Direct Dial: [phone]

## 2019-07-02 NOTE — Telephone Encounter (Signed)
I will gladly do this but would really like to see patient in office.  I do not see where anyone has obtained an EKG.  May be cardiac and not neuro.  Just want to make sure we are referring to the right place.

## 2019-07-02 NOTE — Telephone Encounter (Signed)
07/02/2019  [facility] Care Guide reached out to patient today to offer CCM Services. She spoke with the patient's daughter who relayed that she did not see the neurologist after being referred in November 2020 due to concerns about cost.  She has had several more syncopal episodes since then, and would now like to be scheduled with neurology.   Please order neurology referral if appropriate or have clinical staff contact patient if she needs to be seen or if more information is needed.   Demetrios Loll, BSN, RN-BC Embedded Chronic Care Manager [facility] / [facility] Care Management Direct Dial: [phone]

## 2019-07-14 ENCOUNTER — Other Ambulatory Visit: Payer: Self-pay | Admitting: Family Medicine

## 2019-07-14 ENCOUNTER — Telehealth: Payer: Self-pay | Admitting: Family Medicine

## 2019-07-14 DIAGNOSIS — F331 Major depressive disorder, recurrent, moderate: Secondary | ICD-10-CM

## 2019-07-14 MED ORDER — FLUOXETINE HCL 20 MG PO CAPS: 20.0000 mg | ORAL_CAPSULE | Freq: Every day | ORAL | 5 refills | Status: DC

## 2019-07-14 NOTE — Telephone Encounter (Signed)
Please advise if refill can be done to cover patient until office visit scheduled?

## 2019-07-14 NOTE — Telephone Encounter (Signed)
  Prescription Request  07/14/2019  What is the name of the medication or equipment? FLUoxetine (PROZAC) 20 MG capsule   Have you contacted your pharmacy to request a refill? (if applicable) yes  Which pharmacy would you like this sent to? [facility]   Patient notified that their request is being sent to the clinical staff for review and that they should receive a response within 2 business days.

## 2019-07-19 ENCOUNTER — Other Ambulatory Visit: Payer: Self-pay

## 2019-07-19 ENCOUNTER — Ambulatory Visit (INDEPENDENT_AMBULATORY_CARE_PROVIDER_SITE_OTHER): Payer: Medicare Other | Admitting: Family Medicine

## 2019-07-19 ENCOUNTER — Other Ambulatory Visit: Payer: Self-pay | Admitting: *Deleted

## 2019-07-19 DIAGNOSIS — M51369 Other intervertebral disc degeneration, lumbar region without mention of lumbar back pain or lower extremity pain: Secondary | ICD-10-CM

## 2019-07-19 DIAGNOSIS — K219 Gastro-esophageal reflux disease without esophagitis: Secondary | ICD-10-CM

## 2019-07-19 DIAGNOSIS — F419 Anxiety disorder, unspecified: Secondary | ICD-10-CM

## 2019-07-19 DIAGNOSIS — R569 Unspecified convulsions: Secondary | ICD-10-CM

## 2019-07-19 DIAGNOSIS — F1011 Alcohol abuse, in remission: Secondary | ICD-10-CM

## 2019-07-19 DIAGNOSIS — M5136 Other intervertebral disc degeneration, lumbar region: Secondary | ICD-10-CM | POA: Diagnosis not present

## 2019-07-19 DIAGNOSIS — R630 Anorexia: Secondary | ICD-10-CM | POA: Diagnosis not present

## 2019-07-19 MED ORDER — HYDROXYZINE HCL 25 MG PO TABS: 25.0000 mg | ORAL_TABLET | Freq: Three times a day (TID) | ORAL | 0 refills | Status: DC | PRN

## 2019-07-19 MED ORDER — OMEPRAZOLE 20 MG PO CPDR: 20.0000 mg | DELAYED_RELEASE_CAPSULE | Freq: Every day | ORAL | 0 refills | Status: DC

## 2019-07-19 NOTE — Progress Notes (Signed)
Telephone visit  Subjective: CC: seizure like activity PCP: Janora Norlander, DO Betty Nelson is a 59 y.o. female calls for telephone consult today. Patient provides verbal consent for consult held via phone.  Due to COVID-19 pandemic this visit was conducted virtually. This visit type was conducted due to national recommendations for restrictions regarding the COVID-19 Pandemic (e.g. social distancing, sheltering in place) in an effort to limit this patient's exposure and mitigate transmission in our community. All issues noted in this document were discussed and addressed.  A physical exam was not performed with this format.   Location of patient: home Location of provider: [facility] Others present for call: Betty Nelson, daughter  1. Seizure like activity Daughter reports spells of legs giving out and eyes rolling to back of head.  She hand an episode 3/9 and again 3/17.  Prior to that was 01/2019.  She does lose bowel and bladder continence.  Never had evaluation for this.  Episodes last about 10-15 minutes.  She is non-responsive during episodes.  No known history of seizures in her parents but her daughter, Betty Nelson, has a tonic/clonic seizure disorder.  She was referred to neurology but finances were restrictive at that time so not evaluated.  She apparently had a seizure in 10/2017 but never saw a specialist for that.  She was hospitalized for that seizure.  Her daughter admits that she had an alcohol use disorder.  She is no longer on Xanax as this was tapered earlier this year and transitioned over to hydroxyzine.  2.  Chronic back pain Patient reports chronic back pain.  She was previously prescribed opioid medication but this was tapered off.  She notes that she was overusing her medications and apparently had a negative UDS recently.  She uses meloxicam and Tylenol as well as her gabapentin.  She has had falls as above that seem to be related to seizure disorder.  3.  Poor  appetite Patient's daughter reports ongoing poor appetite despite use of Megace twice daily.  Does not report any chest pain, shortness of breath, lower extremity swelling.  She does not weigh her regularly but notes that there was about a 3 pound weight loss at her last visit with her previous PCP in the winter.  ROS: Per HPI  No Known Allergies Past Medical History:  Diagnosis Date  . Anxiety   . Bipolar disorder ([place])   . Chronic lower back pain   . DDD (degenerative disc disease), lumbar   . Depression   . Disability of walking   . Headache    "about q wk" (10/09/2017)  . Hypertension   . Seizure ([place]) 10/05/2017   "that was my 1st" (10/09/2017)  . 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  Albuterol Sulfate (PROAIR RESPICLICK) 810 (90 Base) MCG/ACT AEPB, Inhale 1-2 puffs into the lungs every 6 (six) weeks. prn, Disp: 1 each, Rfl: 1 .  FLUoxetine (PROZAC) 20 MG capsule, Take 1 capsule (20 mg total) by mouth daily., Disp: 30 capsule, Rfl: 5 .  gabapentin (NEURONTIN) 600 MG tablet, TAKE 1 TABLET (600 MG TOTAL) BY MOUTH AT BEDTIME., Disp: 30 tablet, Rfl: 2 .  hydrOXYzine (ATARAX/VISTARIL) 25 MG tablet, Take 1 tablet (25 mg total) by mouth every 8 (eight) hours as needed for anxiety., Disp: 180 tablet, Rfl: 0 .  megestrol (MEGACE) 40 MG tablet, TAKE 1 TABLET BY MOUTH TWICE A DAY, Disp: 60 tablet, Rfl: 5 .  meloxicam (MOBIC) 7.5 MG tablet,  Take 1 tablet (7.5 mg total) by mouth daily. arthritis, Disp: 30 tablet, Rfl: 5 .  metoprolol tartrate (LOPRESSOR) 25 MG tablet, Take 1 tablet (25 mg total) by mouth 2 (two) times daily., Disp: 60 tablet, Rfl: 11 .  omeprazole (PRILOSEC) 20 MG capsule, Take 1 capsule (20 mg total) by mouth daily. heartburn, Disp: 90 capsule, Rfl: 0 .  SUMAtriptan (IMITREX) 100 MG tablet, Take 1 tablet (100 mg total) by mouth every 2 (two) hours as needed for migraine. May repeat in 2 hours if headache persists or recurs., Disp: 6 tablet, Rfl:  2 .  trazodone (DESYREL) 300 MG tablet, TAKE 1 TABLET BY MOUTH EVERYDAY AT BEDTIME, Disp: 30 tablet, Rfl: 11  Assessment/ Plan: 59 y.o. female   1. Seizure-like activity [facility]) Daughter reports recurrent seizure like activity in the month of March.  I reviewed her chart and apparently prior seizure-like activity was a result of alcohol withdrawal plus or minus benzodiazepine withdrawal.  Her benzodiazepines were tapered earlier this year and she has not been on Xanax per her daughter's report for a while now.  Her anxiety medications were to switch to hydroxyzine.  I informed her that this is available for pickup today.  I am going to place a referral to neurology given family history of seizure disorder.  We discussed red flag signs and symptoms warranting further evaluation. - Ambulatory referral to Neurology  2. DDD (degenerative disc disease), lumbar Patient requesting opioid/pain medications today.  I declined this today given history of opioid dependence and apparent opioid overuse with her previous PCP.  I have encouraged her to continue Tylenol, meloxicam and gabapentin as prescribed.  We will plan to add a muscle relaxer pending above work-up.  I hesitate to add anything sedating given recurrent loss of consciousness.  I offered referral to spine specialist but patient declined this today.  3. History of alcohol abuse  4. Poor appetite Refractory to Megace.  We discussed the risks of clotting with Megace.  Since this is not working of advised her to come off of this medication.  I question if there is something else possibly contributing to poor appetite.  Would consider eval for impaired cognition/dementia versus mental health (there apparently is a diagnosis of bipolar disorder in the past) versus even malignancy if there is a weight loss identified.   Start time: 11:08 End time: 11:29am  Total time spent on patient care (including telephone call/ virtual visit): 33 minutes  Yurani Fettes Hulen Skains, DO [facility] [phone]

## 2019-07-23 ENCOUNTER — Other Ambulatory Visit: Payer: Self-pay

## 2019-07-23 ENCOUNTER — Encounter: Payer: Self-pay | Admitting: Neurology

## 2019-07-23 ENCOUNTER — Ambulatory Visit (INDEPENDENT_AMBULATORY_CARE_PROVIDER_SITE_OTHER): Payer: Medicare Other | Admitting: Neurology

## 2019-07-23 VITALS — BP 120/74 | HR 79 | Temp 97.3°F | Ht 65.0 in | Wt 110.0 lb

## 2019-07-23 DIAGNOSIS — R569 Unspecified convulsions: Secondary | ICD-10-CM

## 2019-07-23 DIAGNOSIS — F1027 Alcohol dependence with alcohol-induced persisting dementia: Secondary | ICD-10-CM | POA: Insufficient documentation

## 2019-07-23 DIAGNOSIS — I63531 Cerebral infarction due to unspecified occlusion or stenosis of right posterior cerebral artery: Secondary | ICD-10-CM | POA: Insufficient documentation

## 2019-07-23 DIAGNOSIS — E512 Wernicke's encephalopathy: Secondary | ICD-10-CM | POA: Insufficient documentation

## 2019-07-23 DIAGNOSIS — F10951 Alcohol use, unspecified with alcohol-induced psychotic disorder with hallucinations: Secondary | ICD-10-CM | POA: Diagnosis not present

## 2019-07-23 HISTORY — DX: Cerebral infarction due to unspecified occlusion or stenosis of right posterior cerebral artery: I63.531

## 2019-07-23 MED ORDER — LEVETIRACETAM 500 MG PO TABS: 500.0000 mg | ORAL_TABLET | Freq: Two times a day (BID) | ORAL | 11 refills | Status: DC

## 2019-07-23 NOTE — Progress Notes (Addendum)
Provider:  Melvyn Novas, MD  Primary Care Physician:  [name], DO [address] [place] [address]     Referring Provider: Jeronimo Greaves [address] [place],  [place] [address]          Chief Complaint according to patient   Patient presents with:    . New Patient (Initial Visit)           HISTORY OF PRESENT ILLNESS:  Lutie Pickler Laramie is a 59 y.o. Black or [place] American female patient seen here upon a referral on 07/23/2019 , PCP has only had phone encounter with the patient, has never seen her.   Chief concern according to patient :  per daughter , seizures when drinking., with withdrawal- patient 's family has never been able to get her to AA or fellowship hall.   PS neither patient nor daughter have recieved the COVID vaccine.    II am seeing Saraiya Kozma Konicki today, a right-handed [place] or [place] American female with a possible seizure  disorder. She has a past medical history of Anxiety, Bipolar disorder (H[facility]), Chronic lower back pain, DDD (degenerative disc disease), lumbar, Depression, Disability of walking, Headache, Hypertension, Seizure ([facility]) (10/05/2017), and Todd's paralysis ([facility]) (10/05/2017). 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 Encephalomalacia. Chronic alcohol and substance abuse. Her with a chronic cough , non productive, cognitive disability and orofacial dyskinesia.      Family medical  history: daughter Sebastian Ache has TC seizures- onset at age 24. Less likely to be genetic.   Social history:  Patient is disabled from a Art therapist and lives in a household with 3 persons, spouse and adult daughter. Pets are not present. Tobacco use; since age 61.  ETOH use - alcohol abuse history , still active  No Caffeine intake      Sleep habits are as follows:  The patient's dinner time is between 7 PM. She drinks beer. She sleeps only 4-5 hours at night.   Review of  Systems: Out of a complete 14 system review, the patient complains of only the following symptoms, and all other reviewed systems are negative.:   hearing loss Cognitive decline Orofacial dyskinesias? Alcohol and tobacco abuse , ongoing.     Social History   Socioeconomic History  . Marital status: Married    Spouse name: Not on file  . Number of children: Not on file  . Years of education: Not on file  . Highest education level: Not on file  Occupational History  . Not on file  Tobacco Use  . Smoking status: Current Every Day Smoker    Packs/day: 0.33    Years: 43.00    Pack years: 14.19    Types: Cigarettes  . Smokeless tobacco: Former Neurosurgeon    Types: Snuff  Substance and Sexual Activity  . Alcohol use: Yes    Alcohol/week: 2.0 standard drinks    Types: 2 Cans of beer per week    Comment: 10/09/2017 "~ 2 beers/wk"  . Drug use: No  . Sexual activity: Not Currently  Other Topics Concern  . Not on file  Social History Narrative  . Not on file   Social Determinants of Health   Financial Resource Strain:   . Difficulty of Paying Living Expenses:   Food Insecurity:   . Worried About Programme researcher, broadcasting/film/video in the Last Year:   .  Ran Out of Food in the Last Year:   Transportation Needs:   . Freight forwarder (Medical):   Marland Kitchen Lack of Transportation (Non-Medical):   Physical Activity:   . Days of Exercise per Week:   . Minutes of Exercise per Session:   Stress:   . Feeling of Stress :   Social Connections:   . Frequency of Communication with Friends and Family:   . Frequency of Social Gatherings with Friends and Family:   . Attends Religious Services:   . Active Member of Clubs or Organizations:   . Attends Banker Meetings:   Marland Kitchen Marital Status:     Family History  Problem Relation Age of Onset  . Heart disease Mother   . Hyperlipidemia Mother   . Heart disease Father   . Hyperlipidemia Father     Past Medical History:  Diagnosis Date  .  Anxiety   . Bipolar disorder (HCC)   . Chronic lower back pain   . DDD (degenerative disc disease), lumbar   . Depression   . Disability of walking   . Headache    "about q wk" (10/09/2017)  . Hypertension   . Seizure ([facility]) 10/05/2017   "that was my 1st" (10/09/2017)  . 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 CESAREAN SECTION  1985  . TONSILLECTOMY       Current Outpatient Medications on File Prior to Visit  Medication Sig Dispense Refill  . Albuterol Sulfate (PROAIR RESPICLICK) 108 (90 Base) MCG/ACT AEPB Inhale 1-2 puffs into the lungs every 6 (six) weeks. prn 1 each 1  . FLUoxetine (PROZAC) 20 MG capsule Take 1 capsule (20 mg total) by mouth daily. 30 capsule 5  . gabapentin (NEURONTIN) 600 MG tablet TAKE 1 TABLET (600 MG TOTAL) BY MOUTH AT BEDTIME. 30 tablet 2  . hydrOXYzine (ATARAX/VISTARIL) 25 MG tablet Take 1 tablet (25 mg total) by mouth every 8 (eight) hours as needed for anxiety. 180 tablet 0  . meloxicam (MOBIC) 7.5 MG tablet Take 1 tablet (7.5 mg total) by mouth daily. arthritis 30 tablet 5  . metoprolol tartrate (LOPRESSOR) 25 MG tablet Take 1 tablet (25 mg total) by mouth 2 (two) times daily. 60 tablet 11  . omeprazole (PRILOSEC) 20 MG capsule Take 1 capsule (20 mg total) by mouth daily. heartburn 90 capsule 0  . SUMAtriptan (IMITREX) 100 MG tablet Take 1 tablet (100 mg total) by mouth every 2 (two) hours as needed for migraine. May repeat in 2 hours if headache persists or recurs. 6 tablet 2  . trazodone (DESYREL) 300 MG tablet TAKE 1 TABLET BY MOUTH EVERYDAY AT BEDTIME 30 tablet 11   No current facility-administered medications on file prior to visit.    No Known Allergies  Physical exam:  Today's Vitals   07/23/19 0804  BP: 120/74  Pulse: 79  Temp: (!) 97.3 F (36.3 C)  Weight: 110 lb (49.9 kg)  Height: 5\' 5"  (1.651 m)   Body mass index is 18.3 kg/m.   Wt Readings from Last 3 Encounters:  07/23/19 110 lb  (49.9 kg)  02/16/19 109 lb 9.6 oz (49.7 kg)  04/21/18 123 lb (55.8 kg)     Ht Readings from Last 3 Encounters:  07/23/19 5\' 5"  (1.651 m)  02/16/19 5\' 3"  (1.6 m)  04/21/18 5\' 3"  (1.6 m)      General: The patient is awake, alert and appears not in acute distress. The  patient is poorly groomed. She appears petite, hunched and frail.  Head: Normocephalic, atraumatic. Neck is supple. Mallampati 1,  neck circumference:12.5 inches . Nasal airflow congested   , edentulous. Cardiovascular:  Regular rate and cardiac rhythm by pulse,  without distended neck veins. Respiratory: Lungs are clear to auscultation.  Skin:  Without evidence of ankle edema, or rash. Trunk: The patient's posture is erect.   Neurologic exam : The patient is disoriented to place and time.  She is unable to follow 2 step cpmmands, has to look to her daughter for any answer to any question.   Memory subjective described as severely impaired - per daughter  Attention span & concentration affected  Speech is non -fluent,  without  dysarthria, dysphonia or aphasia.  Affect is pleasant.    Cranial nerves: no loss of smell or taste reported  Pupils are equal and briskly reactive to light. Funduscopic exam deferred  Extraocular movements in vertical and horizontal planes were  with nystagmus in horizontal plane. No Diplopia. Visual fields by finger perimetry indicate right sided peripheral restriction.  Hearing loss on the left .   Facial sensation intact to fine touch.  Facial motor strength is symmetric and tongue and uvula move midline.  Neck ROM : rotation, tilt and flexion extension were normal for age- but she doesn't understand simple questions her  shoulder shrug was symmetrical.    Motor exam:  Symmetric bulk, tone and ROM.   Normal tone without cog- wheeling, symmetric grip strength .   Sensory:  Fine touch, pinprick and vibration were tested and felt in all extremities.   Proprioception tested in the upper  extremities was limited- bilateral pronatordrift.   Coordination: Rapid alternating movements in the fingers/hands were abnormally slowed Th. e Finger-to-nose maneuver was impaired - evidence of ataxia, dysmetria but not of tremor.   Gait and station: Patient could rise unassisted from a seated position, walked very wide based and unsteady, but without assistive device. She walks unsteady-no tandem gait attempted. She can turn with 6 steps (!) and had to hold out her hand on the left.  Toe and heel walk were deferred.  Deep tendon reflexes: in the  upper and lower extremities are symmetric.brisk . Babinski response was deferred .       After spending a total time of 46 minutes face to face and additional time for physical and neurologic examination, review of laboratory studies,  personal review of imaging studies, reports and results of other testing and review of referral information / records as far as provided in visit, I have established the following assessments:  In short Mrs. Buescher has had seizure like spells for many years but they apparently led to a hospitalization in 2019.  She was hospitalized after a seizure related to a intoxication, in the meantime she has lost her appetite she does have significant cognitive decline, she does have abnormal eye movements, she has suffered silent strokes in the past which may be the cause of seizures as well.  Certainly strokes will leave her with a higher susceptibility to seizures and alcohol will lower the seizure threshold even further.  So strokes have been dated back to likely 2017 and in 2019 chronic encephalomalacia was seen in CT and MRI.  Right putamen and right occipital lobe were affected.  The EtOH abuse has been ongoing, and the patient has had not frequent but sporadic seizure activity.  The activity on the ninth and 17 March and also October 2020 was  associated with bowel and bladder incontinence.  She is nonresponsive, and described as  having tonic-clonic activities with her eyes being open and rolling back into her head.  Nonresponsive left message last about 10 to 15 minutes.  Earlier in 2021 she had been on Xanax but she has transitioned over to hydroxyzine as a calming medication.  There is also a tendency of overusing pain medication but urine drug screens have recently been negative.  The poor appetite's is likely related to the calorie intake from other sources.  I will order an EEG for the brain.  But my main concern is that the patient even if her seizures are alcohol related she is likely to continue having seizures as she gets older and more frail and then will become more frequent.  I am not sure how much she can actually become seizure-free without being substance free.  I will offer an antiepileptic medication hoping that this will reduce future spells but truly the most seizure threshold lowering agent is alcohol.       My Plan is to proceed with:  1) EEG 2) Keppra 500 mg bid 3) alcohol recovery program, AA - strongly recommended. Also strongly recommend to get COVID 19 vaccinated.   I would like to thank  Jeronimo Greaves [address] [place],  [place] [address] for allowing me to meet with and to take care of this pleasant patient.   In short, Vicy Medico Axe is presenting with late stage alcoholism, Wernicke- Korsakoff , I plan to follow up either personally or through our NP within 6 month.   CC: I will share my notes with PCP.  Electronically signed by: Melvyn Novas, MD 07/23/2019 8:33 AM  [facility] Board certified by The [employer] of Sleep Medicine and Diplomate of the [employer] of Sleep Medicine. Board certified In Neurology through the ABPN, Fellow of the [employer] of Neurology. Medical Director of [facility].

## 2019-07-30 ENCOUNTER — Telehealth: Payer: Self-pay | Admitting: Family Medicine

## 2019-07-30 NOTE — Telephone Encounter (Signed)
Please see my note: "Patient requesting opioid/pain medications today.  I declined this today given history of opioid dependence and apparent opioid overuse with her previous PCP.  I have encouraged her to continue Tylenol, meloxicam and gabapentin as prescribed.  We will plan to add a muscle relaxer pending above work-up.  I hesitate to add anything sedating given recurrent loss of consciousness.  I offered referral to spine specialist but patient declined this today."

## 2019-07-30 NOTE — Telephone Encounter (Signed)
Pt called requesting that Dr Nadine Counts prescribe her something for neck and back pain. Pt says she had a televisit with Dr Nadine Counts about her pain on 07/19/19 but Dr Nadine Counts didn't prescribe her anything.

## 2019-07-30 NOTE — Telephone Encounter (Signed)
Requesting medication

## 2019-08-02 NOTE — Telephone Encounter (Signed)
Left message to call back  

## 2019-08-06 ENCOUNTER — Telehealth: Payer: Self-pay | Admitting: Family Medicine

## 2019-08-09 ENCOUNTER — Other Ambulatory Visit: Payer: Self-pay | Admitting: Family Medicine

## 2019-08-09 DIAGNOSIS — M5136 Other intervertebral disc degeneration, lumbar region: Secondary | ICD-10-CM

## 2019-08-09 MED ORDER — TIZANIDINE HCL 2 MG PO TABS: 2.0000 mg | ORAL_TABLET | Freq: Three times a day (TID) | ORAL | 1 refills | Status: DC | PRN

## 2019-08-09 NOTE — Telephone Encounter (Signed)
Zanaflex sent.

## 2019-08-09 NOTE — Telephone Encounter (Signed)
Please advise, this maybe follow up from 4/23 telephone call

## 2019-08-09 NOTE — Telephone Encounter (Signed)
Patient aware and verbalizes understanding. 

## 2019-08-10 NOTE — Telephone Encounter (Signed)
Refer to phone call from 4/30- this encounter will be closed.

## 2019-08-12 ENCOUNTER — Other Ambulatory Visit: Payer: Self-pay

## 2019-08-12 ENCOUNTER — Ambulatory Visit (INDEPENDENT_AMBULATORY_CARE_PROVIDER_SITE_OTHER): Payer: Medicare Other | Admitting: Neurology

## 2019-08-12 DIAGNOSIS — I63531 Cerebral infarction due to unspecified occlusion or stenosis of right posterior cerebral artery: Secondary | ICD-10-CM

## 2019-08-12 DIAGNOSIS — F1027 Alcohol dependence with alcohol-induced persisting dementia: Secondary | ICD-10-CM

## 2019-08-12 DIAGNOSIS — F10951 Alcohol use, unspecified with alcohol-induced psychotic disorder with hallucinations: Secondary | ICD-10-CM

## 2019-08-12 DIAGNOSIS — R569 Unspecified convulsions: Secondary | ICD-10-CM

## 2019-08-12 DIAGNOSIS — E512 Wernicke's encephalopathy: Secondary | ICD-10-CM

## 2019-08-27 ENCOUNTER — Telehealth: Payer: Self-pay | Admitting: *Deleted

## 2019-08-27 ENCOUNTER — Other Ambulatory Visit: Payer: Self-pay | Admitting: Family Medicine

## 2019-08-27 DIAGNOSIS — M5136 Other intervertebral disc degeneration, lumbar region: Secondary | ICD-10-CM

## 2019-08-27 NOTE — Telephone Encounter (Signed)
Should not be using daily.  Please caution sedation and have her use ONLY if needed.  Ok to [employer]

## 2019-08-27 NOTE — Procedures (Signed)
This patient is a 59 year old new patient to our practice with a history of alcohol-related seizures. This recording encompassed  photic stimulation over a total duration of 22 minutes and 5 seconds.  This EEG study was performed in concordance with the international 10-20 system of electrode placement.  Electrical activity was acquired by a sampling rate of 500 Hz with a high frequency filter of 70 Hz and low frequency filter of 1 Hz.  The EEG data were continuously recorded and digitally stored, a videoscreen was separately employed but the video could not be stored within the EEG system.  A single channel EKG electrode was also part of the set up.  At the beginning of this brief EEG recording there is an excessive amount of eye movement noted, these are frequent and appear to reflect blinking.  The activity is symmetric.  Frontopolar channels show a fast rhythm intrusion which is also symmetric, and usually seen as a medication or substance induced finding. A posterior dominant rhythm could finally be established once the patient was able to close her eyes, remaining closed for a couple of minutes.  A posterior dominant background rhythm of only 7 Hz was established.  Hyperventilation was not performed for this patient due to medical history, photic stimulation was applied beginning at 1, 3 and finally 6, 9, 12 and 15 Hz.  There is again excessive eye blinking noted.  No epileptiform activity arose during the stimulation maneuvers, the patient never entered sleep, and her EKG remains regular between 62 and 68 bpm.  Conclusion this EEG is abnormal, #1 a posterior dominant rhythm is significantly slowed indicating a static encephalopathy or intoxication or metabolic encephalopathy.  2.  Frontopolar bit of fast intrusion rhythms over 15 Hz are indicative of GABAergic stimulation either through benzodiazepines or alcohol.  3.  The excessive eye blinking and roving eye movements throughout the study can  correlate to the clinical exam during which the patient showed abnormal eye movements as well.  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  Melvyn Novas, MD

## 2019-08-27 NOTE — Progress Notes (Signed)
1) a posterior dominant rhythm of only 7 herz is significantly slowed ,indicating a static encephalopathy ( neurodegenerative) or through  intoxication or metabolic disorders.   2) Frontopolar beta- fast intrusion rhythms  are  indicative of GABAergic stimulation either through  benzodiazepines or alcohol.   3. The excessive eye blinking and roving eye movements  throughout the study can correlate to the clinical exam during  which the patient showed abnormal eye movements as well.   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

## 2019-08-27 NOTE — Telephone Encounter (Signed)
Daughter will relay message,  medication,  (tizantidine), can cause sedation. Be cautious and do not take daily.   This should be taken as needed only.  Refill sent in .

## 2019-08-31 ENCOUNTER — Telehealth: Payer: Self-pay | Admitting: Neurology

## 2019-08-31 ENCOUNTER — Encounter: Payer: Self-pay | Admitting: Neurology

## 2019-08-31 NOTE — Telephone Encounter (Signed)
Called the daughter to discuss the EEG results. There was no answer. LVM advising the daughter I would send a mychart message and asked for call back or reply there with any questions.

## 2019-08-31 NOTE — Telephone Encounter (Signed)
-----   Message from Melvyn Novas, MD sent at 08/27/2019  2:55 PM EDT ----- 1) a posterior dominant rhythm of only 7 herz is significantly slowed ,indicating a static encephalopathy ( neurodegenerative) or through  intoxication or metabolic disorders.   2) Frontopolar beta- fast intrusion rhythms  are  indicative of GABAergic stimulation either through  benzodiazepines or alcohol.   3. The excessive eye blinking and roving eye movements  throughout the study can correlate to the clinical exam during  which the patient showed abnormal eye movements as well.   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

## 2019-09-01 ENCOUNTER — Other Ambulatory Visit: Payer: Self-pay | Admitting: Family Medicine

## 2019-09-01 DIAGNOSIS — M5136 Other intervertebral disc degeneration, lumbar region: Secondary | ICD-10-CM

## 2019-09-07 ENCOUNTER — Other Ambulatory Visit: Payer: Self-pay | Admitting: Family Medicine

## 2019-09-07 DIAGNOSIS — M5136 Other intervertebral disc degeneration, lumbar region: Secondary | ICD-10-CM

## 2019-09-08 ENCOUNTER — Telehealth: Payer: Self-pay | Admitting: Family Medicine

## 2019-09-08 ENCOUNTER — Other Ambulatory Visit: Payer: Self-pay | Admitting: Family Medicine

## 2019-09-08 DIAGNOSIS — M5136 Other intervertebral disc degeneration, lumbar region: Secondary | ICD-10-CM

## 2019-09-08 NOTE — Telephone Encounter (Signed)
  Prescription Request  09/08/2019  What is the name of the medication or equipment? tiZANidine (ZANAFLEX) 2 MG tablet   Have you contacted your pharmacy to request a refill? (if applicable) yes  Which pharmacy would you like this sent to? [facility]  Daughter aware that office will not fill rx because it was just filled 08/27/2019 but I told her I would send the message. Please call back daughter back   Patient notified that their request is being sent to the clinical staff for review and that they should receive a response within 2 business days.

## 2019-09-08 NOTE — Telephone Encounter (Signed)
Daughter aware of provider's advice.

## 2019-09-08 NOTE — Telephone Encounter (Signed)
Please have her schedule an IN office visit.

## 2019-09-14 ENCOUNTER — Other Ambulatory Visit: Payer: Self-pay | Admitting: Family Medicine

## 2019-09-14 DIAGNOSIS — F419 Anxiety disorder, unspecified: Secondary | ICD-10-CM

## 2019-09-26 ENCOUNTER — Other Ambulatory Visit: Payer: Self-pay | Admitting: Family Medicine

## 2019-09-26 DIAGNOSIS — M5136 Other intervertebral disc degeneration, lumbar region: Secondary | ICD-10-CM

## 2019-10-13 ENCOUNTER — Ambulatory Visit: Payer: Medicare Other | Admitting: Family Medicine

## 2019-10-13 NOTE — Progress Notes (Deleted)
Subjective: CC:*** PCP: [name], DO AJO:INOMVE G Drouillard is a 59 y.o. female presenting to clinic today for:  1. ***   ROS: Per HPI  No Known Allergies Past Medical History:  Diagnosis Date  . Anxiety   . Bipolar disorder (HCC)   . Chronic lower back pain   . DDD (degenerative disc disease), lumbar   . Depression   . Disability of walking   . Headache    "about q wk" (10/09/2017)  . Hypertension   . Seizure (HCC) 10/05/2017   "that was my 1st" (10/09/2017)  . 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  Albuterol Sulfate (PROAIR RESPICLICK) 108 (90 Base) MCG/ACT AEPB, Inhale 1-2 puffs into the lungs every 6 (six) weeks. prn, Disp: 1 each, Rfl: 1 .  FLUoxetine (PROZAC) 20 MG capsule, Take 1 capsule (20 mg total) by mouth daily., Disp: 30 capsule, Rfl: 5 .  gabapentin (NEURONTIN) 600 MG tablet, TAKE 1 TABLET (600 MG TOTAL) BY MOUTH AT BEDTIME., Disp: 30 tablet, Rfl: 0 .  hydrOXYzine (ATARAX/VISTARIL) 25 MG tablet, TAKE 1 TABLET (25 MG TOTAL) BY MOUTH EVERY 8 (EIGHT) HOURS AS NEEDED FOR ANXIETY., Disp: 180 tablet, Rfl: 0 .  levETIRAcetam (KEPPRA) 500 MG tablet, Take 1 tablet (500 mg total) by mouth 2 (two) times daily., Disp: 120 tablet, Rfl: 11 .  meloxicam (MOBIC) 7.5 MG tablet, Take 1 tablet (7.5 mg total) by mouth daily. arthritis, Disp: 30 tablet, Rfl: 5 .  metoprolol tartrate (LOPRESSOR) 25 MG tablet, Take 1 tablet (25 mg total) by mouth 2 (two) times daily., Disp: 60 tablet, Rfl: 11 .  omeprazole (PRILOSEC) 20 MG capsule, Take 1 capsule (20 mg total) by mouth daily. heartburn, Disp: 90 capsule, Rfl: 0 .  SUMAtriptan (IMITREX) 100 MG tablet, Take 1 tablet (100 mg total) by mouth every 2 (two) hours as needed for migraine. May repeat in 2 hours if headache persists or recurs., Disp: 6 tablet, Rfl: 2 .  tiZANidine (ZANAFLEX) 2 MG tablet, TAKE 1 TABLET EVERY 8 HOURS AS NEEDED FOR MUSCLE SPASMS (USE SPARINGLY, CAUSES SEDATION/ FALLS)., Disp:  30 tablet, Rfl: 0 .  trazodone (DESYREL) 300 MG tablet, TAKE 1 TABLET BY MOUTH EVERYDAY AT BEDTIME, Disp: 30 tablet, Rfl: 11 Social History   Socioeconomic History  . Marital status: Married    Spouse name: Not on file  . Number of children: Not on file  . Years of education: Not on file  . Highest education level: Not on file  Occupational History  . Not on file  Tobacco Use  . Smoking status: Current Every Day Smoker    Packs/day: 0.33    Years: 43.00    Pack years: 14.19    Types: Cigarettes  . Smokeless tobacco: Former Neurosurgeon    Types: Snuff  Vaping Use  . Vaping Use: Never used  Substance and Sexual Activity  . Alcohol use: Yes    Alcohol/week: 2.0 standard drinks    Types: 2 Cans of beer per week    Comment: 10/09/2017 "~ 2 beers/wk"  . Drug use: No  . Sexual activity: Not Currently  Other Topics Concern  . Not on file  Social History Narrative  . Not on file   Social Determinants of Health   Financial Resource Strain:   . Difficulty of Paying Living Expenses:   Food Insecurity:   . Worried About Programme researcher, broadcasting/film/video in the Last Year:   . [employer] of  Food in the Last Year:   Transportation Needs:   . Freight forwarder (Medical):   Marland Kitchen Lack of Transportation (Non-Medical):   Physical Activity:   . Days of Exercise per Week:   . Minutes of Exercise per Session:   Stress:   . Feeling of Stress :   Social Connections:   . Frequency of Communication with Friends and Family:   . Frequency of Social Gatherings with Friends and Family:   . Attends Religious Services:   . Active Member of Clubs or Organizations:   . Attends Banker Meetings:   Marland Kitchen Marital Status:   Intimate Partner Violence:   . Fear of Current or Ex-Partner:   . Emotionally Abused:   Marland Kitchen Physically Abused:   . Sexually Abused:    Family History  Problem Relation Age of Onset  . Heart disease Mother   . Hyperlipidemia Mother   . Heart disease Father   . Hyperlipidemia Father      Objective: Office vital signs reviewed. There were no vitals taken for this visit.  Physical Examination:  General: Awake, alert, *** nourished, No acute distress HEENT: Normal    Neck: No masses palpated. No lymphadenopathy    Ears: Tympanic membranes intact, normal light reflex, no erythema, no bulging    Eyes: PERRLA, extraocular membranes intact, sclera ***    Nose: nasal turbinates moist, *** nasal discharge    Throat: moist mucus membranes, no erythema, *** tonsillar exudate.  Airway is patent Cardio: regular rate and rhythm, S1S2 heard, no murmurs appreciated Pulm: clear to auscultation bilaterally, no wheezes, rhonchi or rales; normal work of breathing on room air GI: soft, non-tender, non-distended, bowel sounds present x4, no hepatomegaly, no splenomegaly, no masses GU: external vaginal tissue ***, cervix ***, *** punctate lesions on cervix appreciated, *** discharge from cervical os, *** bleeding, *** cervical motion tenderness, *** abdominal/ adnexal masses Extremities: warm, well perfused, No edema, cyanosis or clubbing; +*** pulses bilaterally MSK: *** gait and *** station Skin: dry; intact; no rashes or lesions Neuro: *** Strength and light touch sensation grossly intact, *** DTRs ***/4  Assessment/ Plan: 59 y.o. female   ***  No orders of the defined types were placed in this encounter.  No orders of the defined types were placed in this encounter.    [name], DO [facility] [phone]

## 2019-10-22 ENCOUNTER — Other Ambulatory Visit: Payer: Self-pay | Admitting: Family Medicine

## 2019-10-22 DIAGNOSIS — M5136 Other intervertebral disc degeneration, lumbar region: Secondary | ICD-10-CM

## 2019-11-01 ENCOUNTER — Other Ambulatory Visit: Payer: Self-pay | Admitting: *Deleted

## 2019-11-01 MED ORDER — METOPROLOL TARTRATE 25 MG PO TABS: 25.0000 mg | ORAL_TABLET | Freq: Two times a day (BID) | ORAL | 0 refills | Status: DC

## 2019-11-02 ENCOUNTER — Encounter: Payer: Self-pay | Admitting: Family Medicine

## 2019-11-02 ENCOUNTER — Other Ambulatory Visit: Payer: Self-pay

## 2019-11-02 ENCOUNTER — Ambulatory Visit (INDEPENDENT_AMBULATORY_CARE_PROVIDER_SITE_OTHER): Payer: Medicare Other | Admitting: Family Medicine

## 2019-11-02 VITALS — BP 115/58 | HR 71 | Temp 97.0°F | Ht 65.0 in | Wt 103.0 lb

## 2019-11-02 DIAGNOSIS — F1027 Alcohol dependence with alcohol-induced persisting dementia: Secondary | ICD-10-CM | POA: Diagnosis not present

## 2019-11-02 DIAGNOSIS — F1020 Alcohol dependence, uncomplicated: Secondary | ICD-10-CM

## 2019-11-02 DIAGNOSIS — R600 Localized edema: Secondary | ICD-10-CM | POA: Diagnosis not present

## 2019-11-02 DIAGNOSIS — E512 Wernicke's encephalopathy: Secondary | ICD-10-CM | POA: Diagnosis not present

## 2019-11-02 DIAGNOSIS — M5136 Other intervertebral disc degeneration, lumbar region: Secondary | ICD-10-CM

## 2019-11-02 DIAGNOSIS — D649 Anemia, unspecified: Secondary | ICD-10-CM

## 2019-11-02 MED ORDER — TIZANIDINE HCL 2 MG PO TABS: ORAL_TABLET | ORAL | 1 refills | Status: DC

## 2019-11-02 NOTE — Patient Instructions (Signed)
You had labs performed today.  You will be contacted with the results of the labs once they are available, usually in the next 3 business days for routine lab work.  If you have an active my chart account, they will be released to your MyChart.  If you prefer to have these labs released to you via telephone, please let us know.  If you had a pap smear or biopsy performed, expect to be contacted in about 7-10 days.  Lorin Picket will contact you with rehab resources.  Edema  Edema is when you have too much fluid in your body or under your skin. Edema may make your legs, feet, and ankles swell up. Swelling is also common in looser tissues, like around your eyes. This is a common condition. It gets more common as you get older. There are many possible causes of edema. Eating too much salt (sodium) and being on your feet or sitting for a long time can cause edema in your legs, feet, and ankles. Hot weather may make edema worse. Edema is usually painless. Your skin may look swollen or shiny. Follow these instructions at home:  Keep the swollen body part raised (elevated) above the level of your heart when you are sitting or lying down.  Do not sit still or stand for a long time.  Do not wear tight clothes. Do not wear garters on your upper legs.  Exercise your legs. This can help the swelling go down.  Wear elastic bandages or support stockings as told by your doctor.  Eat a low-salt (low-sodium) diet to reduce fluid as told by your doctor.  Depending on the cause of your swelling, you may need to limit how much fluid you drink (fluid restriction).  Take over-the-counter and prescription medicines only as told by your doctor. Contact a doctor if:  Treatment is not working.  You have heart, liver, or kidney disease and have symptoms of edema.  You have sudden and unexplained weight gain. Get help right away if:  You have shortness of breath or chest pain.  You cannot breathe when you lie  down.  You have pain, redness, or warmth in the swollen areas.  You have heart, liver, or kidney disease and get edema all of a sudden.  You have a fever and your symptoms get worse all of a sudden. Summary  Edema is when you have too much fluid in your body or under your skin.  Edema may make your legs, feet, and ankles swell up. Swelling is also common in looser tissues, like around your eyes.  Raise (elevate) the swollen body part above the level of your heart when you are sitting or lying down.  Follow your doctor's instructions about diet and how much fluid you can drink (fluid restriction). This information is not intended to replace advice given to you by your health care provider. Make sure you discuss any questions you have with your health care provider. Document Revised: 03/28/2017 Document Reviewed: 04/12/2016 Elsevier Patient Education  2020 [employer].

## 2019-11-02 NOTE — Progress Notes (Signed)
Subjective: CC: Follow-up leg edema PCP: Janora Norlander, DO Betty Nelson is a 59 y.o. female presenting to clinic today for:  1.  Leg edema Patient is brought to the office by her daughter who provides much of the history.  She notes bilateral lower extremity edema that is been ongoing for about 2 months now.  She feels that the lower extremity edema seems to be getting slightly worse, particularly in the right lower extremity.  Denies any recent travel, increased warmth or pain with ambulation.  Patient ambulates at baseline with a cane and has chronic low back pain as well as various other pains throughout her body.  She has known dementia and alcohol induced seizure activity.  She was seen by neurology recently.  She is treated with Keppra and has been seizure-free.  Unfortunately she has not started tapering nor does she plan to taper off of alcohol and continues to drink regularly.  She is taking meloxicam 7.5 mg daily for pain.  Denies any use of other oral NSAIDs.  No change in salt intake.  Denies any orthopnea, wheezing, shortness of breath.  No chest pain.  No falls.  She is on a daily multivitamin.   ROS: Per HPI  No Known Allergies Past Medical History:  Diagnosis Date  . Anxiety   . Bipolar disorder ([place])   . Chronic lower back pain   . DDD (degenerative disc disease), lumbar   . Depression   . Disability of walking   . Headache    "about q wk" (10/09/2017)  . Hypertension   . Seizure ([place]) 10/05/2017   "that was my 1st" (10/09/2017)  . Todd's paralysis ([place]) 10/05/2017    Current Outpatient Medications:  .  FLUoxetine (PROZAC) 20 MG capsule, Take 1 capsule (20 mg total) by mouth daily., Disp: 30 capsule, Rfl: 5 .  gabapentin (NEURONTIN) 600 MG tablet, TAKE 1 TABLET (600 MG TOTAL) BY MOUTH AT BEDTIME., Disp: 30 tablet, Rfl: 0 .  hydrOXYzine (ATARAX/VISTARIL) 25 MG tablet, TAKE 1 TABLET (25 MG TOTAL) BY MOUTH EVERY 8 (EIGHT) HOURS AS NEEDED FOR ANXIETY., Disp:  180 tablet, Rfl: 0 .  levETIRAcetam (KEPPRA) 500 MG tablet, Take 1 tablet (500 mg total) by mouth 2 (two) times daily., Disp: 120 tablet, Rfl: 11 .  meloxicam (MOBIC) 7.5 MG tablet, Take 1 tablet (7.5 mg total) by mouth daily. arthritis, Disp: 30 tablet, Rfl: 5 .  metoprolol tartrate (LOPRESSOR) 25 MG tablet, Take 1 tablet (25 mg total) by mouth 2 (two) times daily., Disp: 60 tablet, Rfl: 0 .  omeprazole (PRILOSEC) 20 MG capsule, Take 1 capsule (20 mg total) by mouth daily. heartburn, Disp: 90 capsule, Rfl: 0 .  trazodone (DESYREL) 300 MG tablet, TAKE 1 TABLET BY MOUTH EVERYDAY AT BEDTIME, Disp: 30 tablet, Rfl: 11 .  tiZANidine (ZANAFLEX) 2 MG tablet, TAKE 1 TABLET EVERY 8 HOURS AS NEEDED FOR MUSCLE SPASMS (USE SPARINGLY, CAUSES SEDATION/ FALLS). (Patient not taking: Reported on 11/02/2019), Disp: 30 tablet, Rfl: 0 Social History   Socioeconomic History  . Marital status: Married    Spouse name: Not on file  . Number of children: Not on file  . Years of education: Not on file  . Highest education level: Not on file  Occupational History  . Not on file  Tobacco Use  . Smoking status: Current Every Day Smoker    Packs/day: 0.33    Years: 43.00    Pack years: 14.19    Types: Cigarettes  .  Smokeless tobacco: Former Systems developer    Types: Snuff  Vaping Use  . Vaping Use: Never used  Substance and Sexual Activity  . Alcohol use: Yes    Alcohol/week: 2.0 standard drinks    Types: 2 Cans of beer per week    Comment: 10/09/2017 "~ 2 beers/wk"  . Drug use: No  . Sexual activity: Not Currently  Other Topics Concern  . Not on file  Social History Narrative  . Not on file   Social Determinants of Health   Financial Resource Strain:   . Difficulty of Paying Living Expenses:   Food Insecurity:   . Worried About Charity fundraiser in the Last Year:   . Arboriculturist in the Last Year:   Transportation Needs:   . Film/video editor (Medical):   Marland Kitchen Lack of Transportation (Non-Medical):     Physical Activity:   . Days of Exercise per Week:   . Minutes of Exercise per Session:   Stress:   . Feeling of Stress :   Social Connections:   . Frequency of Communication with Friends and Family:   . Frequency of Social Gatherings with Friends and Family:   . Attends Religious Services:   . Active Member of Clubs or Organizations:   . Attends Archivist Meetings:   Marland Kitchen Marital Status:   Intimate Partner Violence:   . Fear of Current or Ex-Partner:   . Emotionally Abused:   Marland Kitchen Physically Abused:   . Sexually Abused:    Family History  Problem Relation Age of Onset  . Heart disease Mother   . Hyperlipidemia Mother   . Heart disease Father   . Hyperlipidemia Father     Objective: Office vital signs reviewed. BP (!) 115/58   Pulse 71   Temp (!) 97 F (36.1 C)   Ht '5\' 5"'  (1.651 m)   Wt 103 lb (46.7 kg)   SpO2 100%   BMI 17.14 kg/m   Physical Examination:  General: Awake, alert, chronically ill appearing, appears poorly groomed, dissheveled, drwosy HEENT: Normal; no icterus; no JVD Cardio: regular rate and rhythm, S1S2 heard, no murmurs appreciated Pulm: clear to auscultation bilaterally, no wheezes, rhonchi or rales; normal work of breathing on room air GI: Hepatomegaly appreciated.  No ascites appreciated. Extremities: warm, well perfused, nonpitting edema noted to bilateral mid shins, no cyanosis or clubbing;  MSK: Gait is unsteady and requires assistance for ambulation Psych: Mood is stable Neuro: Seems intermittently confused and often looks to her daughter for answers to questions  Assessment/ Plan: 59 y.o. female   1. Alcoholism ([place]) Unfortunately nowhere near ready to stop.  She was very lethargic during today's visit.  I am running full metabolic panel on her.  I am referring her to CCM.  I would like to at least get her connected with resources for rehab so that they can have this ready when she is ready to stop drinking - CMP14+EGFR - CBC -  Vitamin B12 - TSH - Folate - Referral to Chronic Care Management Services  2. 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  Unfortunately unlikely to improve given ongoing alcoholism - Referral to Chronic Care Management Services  3. Wernicke's encephalopathy with cerebellar manifestation - Referral to Chronic Care Management Services  4. Bilateral lower extremity edema Likely secondary to the above.  I suspect that she is can have a low albumin level given ongoing alcoholism and that this is  what is likely contributing to her lower extremity edema.  Would not be surprised if she was anemic as well with a vitamin B12 and/or folate deficiency.  We will check for thyroid issues. - CMP14+EGFR - CBC - TSH - Referral to Chronic Care Management Services  5. DDD (degenerative disc disease), lumbar Again reinforced need for extremely rare use of the tizanidine given ongoing lethargy which I suspect is related to alcoholism.  I am absolutely not willing to prescribe her opioids given ongoing alcoholism and history of opioid abuse. - tiZANidine (ZANAFLEX) 2 MG tablet; TAKE 1/2-1 TABLET EVERY 8 HOURS AS NEEDED FOR MUSCLE SPASMS (USE SPARINGLY, CAUSES SEDATION/ FALLS).  Dispense: 60 tablet; Refill: 1 - Referral to Chronic Care Management Services   No orders of the defined types were placed in this encounter.  No orders of the defined types were placed in this encounter.    Janora Norlander, DO [place] [phone]

## 2019-11-03 ENCOUNTER — Telehealth: Payer: Self-pay | Admitting: *Deleted

## 2019-11-03 NOTE — Chronic Care Management (AMB) (Signed)
  Chronic Care Management   Outreach Note  11/03/2019 Name: Betty Nelson MRN: [id number] DOB: [birth year]  Betty Nelson is a 59 y.o. year old female who is a primary care patient of [name], DO. I reached out to [employer] by phone today in response to a referral sent by Ms. Camillia Herter Hosang's PCP, Delynn Flavin M, DO.     An unsuccessful telephone outreach was attempted today. The patient was referred to the case management team for assistance with care management and care coordination.   Follow Up Plan: A HIPPA compliant phone message was left for the patient providing contact information and requesting a return call. The care management team will reach out to the patient again over the next 7 days. If patient returns call to provider office, please advise to call Embedded Care Management Care Guide Gwenevere Ghazi at [phone].  Gwenevere Ghazi  Care Guide, Embedded Care Coordination [facility]  [place], [place] [address] Direct Dial: [phone] [email or web address] Website: [email or web address]

## 2019-11-04 ENCOUNTER — Other Ambulatory Visit: Payer: Self-pay

## 2019-11-04 LAB — CMP14+EGFR
ALT: 18 IU/L (ref 0–32)
AST: 29 IU/L (ref 0–40)
Albumin/Globulin Ratio: 1.6 (ref 1.2–2.2)
Albumin: 4 g/dL (ref 3.8–4.8)
Alkaline Phosphatase: 67 IU/L (ref 48–121)
BUN/Creatinine Ratio: 10 — ABNORMAL LOW (ref 12–28)
BUN: 8 mg/dL (ref 8–27)
Bilirubin Total: 0.3 mg/dL (ref 0.0–1.2)
CO2: 16 mmol/L — ABNORMAL LOW (ref 20–29)
Calcium: 9 mg/dL (ref 8.7–10.3)
Chloride: 106 mmol/L (ref 96–106)
Creatinine, Ser: 0.83 mg/dL (ref 0.57–1.00)
GFR calc Af Amer: 88 mL/min/{1.73_m2} (ref >59)
GFR calc non Af Amer: 76 mL/min/{1.73_m2} (ref >59)
Globulin, Total: 2.5 g/dL (ref 1.5–4.5)
Glucose: 68 mg/dL (ref 65–99)
Potassium: 4.6 mmol/L (ref 3.5–5.2)
Sodium: 141 mmol/L (ref 134–144)
Total Protein: 6.5 g/dL (ref 6.0–8.5)

## 2019-11-04 LAB — CBC
Hematocrit: 34.6 % (ref 34.0–46.6)
Hemoglobin: 10.9 g/dL — ABNORMAL LOW (ref 11.1–15.9)
MCH: 27.9 pg (ref 26.6–33.0)
MCHC: 31.5 g/dL (ref 31.5–35.7)
MCV: 89 fL (ref 79–97)
Platelets: 355 10*3/uL (ref 150–450)
RBC: 3.9 x10E6/uL (ref 3.77–5.28)
RDW: 13.5 % (ref 11.7–15.4)
WBC: 6.5 10*3/uL (ref 3.4–10.8)

## 2019-11-04 LAB — VITAMIN B12: Vitamin B-12: 402 pg/mL (ref 232–1245)

## 2019-11-04 LAB — TSH: TSH: 2.25 u[IU]/mL (ref 0.450–4.500)

## 2019-11-04 LAB — FOLATE: Folate: 8.1 ng/mL (ref >3.0)

## 2019-11-05 NOTE — Addendum Note (Signed)
Addended by: [name] on: 11/05/2019 07:51 AM   Modules accepted: Orders

## 2019-11-08 NOTE — Chronic Care Management (AMB) (Signed)
  Chronic Care Management   Note  11/08/2019 Name: Betty Nelson MRN: [id number] DOB: 1960/08/02  Betty Nelson is a 59 y.o. year old female who is a primary care patient of [name], DO and is actively engaged with the care management team. I reached out to Camillia Herter Blann by phone today to assist with scheduling an initial visit with the Licensed Clinical Social Worker.  Follow up plan: A second unsuccessful telephone outreach attempt made. A HIPPA compliant phone message was left for the patient providing contact information and requesting a return call. The care management team will reach out to the patient again over the next 7 days. If patient returns call to provider office, please advise to call Embedded Care Management Care Guide Gwenevere Ghazi at [phone].  Gwenevere Ghazi  Care Guide, Embedded Care Coordination [facility]  [place], [place] [address] Direct Dial: [phone] [email or web address] Website: [email or web address]

## 2019-11-09 ENCOUNTER — Encounter: Payer: Self-pay | Admitting: Internal Medicine

## 2019-11-10 ENCOUNTER — Other Ambulatory Visit: Payer: Self-pay | Admitting: Family Medicine

## 2019-11-10 DIAGNOSIS — F419 Anxiety disorder, unspecified: Secondary | ICD-10-CM

## 2019-11-11 ENCOUNTER — Telehealth: Payer: Self-pay | Admitting: Family Medicine

## 2019-11-11 NOTE — Telephone Encounter (Signed)
Pain medication is not working at all she has been on it for 8 weeks. She is wanting to discuss a change. Would you like her to have a visit to discuss?She is aware you are off today and will have answer when you return.

## 2019-11-12 ENCOUNTER — Ambulatory Visit (INDEPENDENT_AMBULATORY_CARE_PROVIDER_SITE_OTHER): Payer: Medicare Other | Admitting: Family

## 2019-11-12 ENCOUNTER — Encounter: Payer: Self-pay | Admitting: Family

## 2019-11-12 DIAGNOSIS — I6930 Unspecified sequelae of cerebral infarction: Secondary | ICD-10-CM | POA: Diagnosis not present

## 2019-11-12 DIAGNOSIS — J301 Allergic rhinitis due to pollen: Secondary | ICD-10-CM

## 2019-11-12 MED ORDER — FEXOFENADINE HCL 180 MG PO TABS: 180.0000 mg | ORAL_TABLET | Freq: Every day | ORAL | 2 refills | Status: DC

## 2019-11-12 MED ORDER — FLUTICASONE PROPIONATE 50 MCG/ACT NA SUSP: 2.0000 | Freq: Every day | NASAL | 6 refills | Status: DC

## 2019-11-12 NOTE — Telephone Encounter (Addendum)
Pt aware of provider feedback and voiced understanding stating she doesn't want a referral at this time, also was c/o allergies and wanted something called in. Advised pt she would ntbs so she scheduled for televisit today with acute provider.

## 2019-11-12 NOTE — Progress Notes (Signed)
   Virtual Visit via telephone Note Due to COVID-19 pandemic this visit was conducted virtually. This visit type was conducted due to national recommendations for restrictions regarding the COVID-19 Pandemic (e.g. social distancing, sheltering in place) in an effort to limit this patient's exposure and mitigate transmission in our community. All issues noted in this document were discussed and addressed.  A physical exam was not performed with this format.  I connected with Betty Nelson on 11/12/19 at 12:39 pm  by telephone and verified that I am speaking with the correct person using two identifiers. Betty Nelson is currently located at home and daughter is currently with her during visit. The provider, Jannifer Rodney, FNP is located in their office at time of visit.  I discussed the limitations, risks, security and privacy concerns of performing an evaluation and management service by telephone and the availability of in person appointments. I also discussed with the patient that there may be a patient responsible charge related to this service. The patient expressed understanding and agreed to proceed.   History and Present Illness:  URI  This is a new problem. The current episode started more than 1 month ago. Associated symptoms include congestion, coughing, ear pain, headaches, rhinorrhea and sneezing. Pertinent negatives include no nausea, sore throat or wheezing. She has tried increased fluids (zyrtec) for the symptoms. The treatment provided mild relief.      Review of Systems  HENT: Positive for congestion, ear pain, rhinorrhea and sneezing. Negative for sore throat.   Respiratory: Positive for cough. Negative for wheezing.   Gastrointestinal: Negative for nausea.  Neurological: Positive for headaches.     Observations/Objective: No SOB or distress noted   Assessment and Plan: 1. Allergic rhinitis due to pollen, unspecified seasonality Will start Allegra and  Flonase Avoid allergens  RTO if symptoms worsen or do not improve Keep follow up with PCP - fexofenadine (ALLEGRA ALLERGY) 180 MG tablet; Take 1 tablet (180 mg total) by mouth daily.  Dispense: 90 tablet; Refill: 2 - fluticasone (FLONASE) 50 MCG/ACT nasal spray; Place 2 sprays into both nostrils daily.  Dispense: 16 g; Refill: 6     I discussed the assessment and treatment plan with the patient. The patient was provided an opportunity to ask questions and all were answered. The patient agreed with the plan and demonstrated an understanding of the instructions.   The patient was advised to call back or seek an in-person evaluation if the symptoms worsen or if the condition fails to improve as anticipated.  The above assessment and management plan was discussed with the patient. The patient verbalized understanding of and has agreed to the management plan. Patient is aware to call the clinic if symptoms persist or worsen. Patient is aware when to return to the clinic for a follow-up visit. Patient educated on when it is appropriate to go to the emergency department.   Time call ended:  12:49 pm   I provided 10 minutes of non-face-to-face time during this encounter.    Jannifer Rodney, FNP '

## 2019-11-12 NOTE — Telephone Encounter (Signed)
I have told this patient REPEATEDLY that I WILL NOT PRESCRIBE OPIOIDS.  She is an alcoholic and has history of opioid abuse.  I am glad to refer to a rehabilitation center if she would like.  On her last exam she was EXTREMELY groggy.  Sedating medications are not appropriate.    PER MY LAST NOTE: 5. DDD (degenerative disc disease), lumbar Again reinforced need for extremely rare use of the tizanidine given ongoing lethargy which I suspect is related to alcoholism.  I am absolutely not willing to prescribe her opioids given ongoing alcoholism and history of opioid abuse. - tiZANidine (ZANAFLEX) 2 MG tablet; TAKE 1/2-1 TABLET EVERY 8 HOURS AS NEEDED FOR MUSCLE SPASMS (USE SPARINGLY, CAUSES SEDATION/ FALLS).  Dispense: 60 tablet; Refill: 1 - Referral to Chronic Care Management Services

## 2019-11-16 ENCOUNTER — Other Ambulatory Visit: Payer: Self-pay | Admitting: *Deleted

## 2019-11-16 DIAGNOSIS — F5101 Primary insomnia: Secondary | ICD-10-CM

## 2019-11-16 MED ORDER — TRAZODONE HCL 150 MG PO TABS: 75.0000 mg | ORAL_TABLET | Freq: Every evening | ORAL | 0 refills | Status: DC | PRN

## 2019-11-19 ENCOUNTER — Other Ambulatory Visit: Payer: Self-pay

## 2019-11-19 DIAGNOSIS — M5136 Other intervertebral disc degeneration, lumbar region: Secondary | ICD-10-CM

## 2019-11-19 MED ORDER — MELOXICAM 7.5 MG PO TABS: 7.5000 mg | ORAL_TABLET | Freq: Every day | ORAL | 2 refills | Status: DC

## 2019-11-19 NOTE — Chronic Care Management (AMB) (Signed)
  Chronic Care Management   Outreach Note  11/19/2019 Name: Ishana Blades Woloszyn MRN: [id number] DOB: [birth year]  Denisia Harpole Whidbee is a 59 y.o. year old female who is a primary care patient of [name], DO. I reached out to [employer] by phone today in response to a referral sent by Ms. Camillia Herter Koroma's PCP, Delynn Flavin M, DO.     Third unsuccessful telephone outreach was attempted today. The patient was referred to the case management team for assistance with care management and care coordination. The patient's primary care provider has been notified of our unsuccessful attempts to make or maintain contact with the patient. The care management team is pleased to engage with this patient at any time in the future should he/she be interested in assistance from the care management team.   Follow Up Plan: A HIPPA compliant phone message was left for the patient providing contact information and requesting a return call. The care management team is available to follow up with the patient after provider conversation with the patient regarding recommendation for care management engagement and subsequent re-referral to the care management team.   [facility] Guide, Embedded Care Coordination [facility]  [place], [place] [address] Direct Dial: [phone] [email or web address]: [email or web address]

## 2019-11-20 ENCOUNTER — Other Ambulatory Visit: Payer: Self-pay | Admitting: Family Medicine

## 2019-11-20 DIAGNOSIS — M5136 Other intervertebral disc degeneration, lumbar region: Secondary | ICD-10-CM

## 2019-11-25 ENCOUNTER — Ambulatory Visit: Payer: Medicare Other | Admitting: Adult Health

## 2019-11-29 ENCOUNTER — Other Ambulatory Visit: Payer: Self-pay | Admitting: Family Medicine

## 2019-12-15 ENCOUNTER — Other Ambulatory Visit: Payer: Self-pay | Admitting: Family Medicine

## 2019-12-15 DIAGNOSIS — F5101 Primary insomnia: Secondary | ICD-10-CM

## 2019-12-21 ENCOUNTER — Other Ambulatory Visit: Payer: Self-pay | Admitting: Family Medicine

## 2019-12-21 DIAGNOSIS — M5136 Other intervertebral disc degeneration, lumbar region: Secondary | ICD-10-CM

## 2019-12-31 ENCOUNTER — Ambulatory Visit: Payer: Medicare Other | Admitting: Gastroenterology

## 2020-01-14 ENCOUNTER — Telehealth: Payer: Self-pay

## 2020-01-18 ENCOUNTER — Encounter: Payer: Self-pay | Admitting: Family Medicine

## 2020-01-18 ENCOUNTER — Ambulatory Visit: Payer: Medicare Other | Admitting: Nurse Practitioner

## 2020-01-18 ENCOUNTER — Ambulatory Visit (INDEPENDENT_AMBULATORY_CARE_PROVIDER_SITE_OTHER): Payer: Medicare Other | Admitting: Family Medicine

## 2020-01-18 DIAGNOSIS — H9319 Tinnitus, unspecified ear: Secondary | ICD-10-CM

## 2020-01-18 NOTE — Progress Notes (Signed)
Virtual Visit via Telephone Note  I connected with Betty Nelson on 01/18/20 at 9:37 AM by telephone and verified that I am speaking with the correct person using two identifiers. Betty Nelson is currently located at home and her daughter is currently with her during this visit. The provider, Gwenlyn Fudge, FNP is located in their home at time of visit.  I discussed the limitations, risks, security and privacy concerns of performing an evaluation and management service by telephone and the availability of in person appointments. I also discussed with the patient that there may be a patient responsible charge related to this service. The patient expressed understanding and agreed to proceed.  Subjective: PCP: [name], DO  Chief Complaint  Patient presents with  . Tinnitus   Patient complains of a humming/ringing sound.  She reports she was told years ago she cannot hear out of her left ear at all.  She cannot tell me if the ringing is in one ear or both.  She has never seen an audiologist before.   ROS: Per HPI  Current Outpatient Medications:  .  fexofenadine (ALLEGRA ALLERGY) 180 MG tablet, Take 1 tablet (180 mg total) by mouth daily., Disp: 90 tablet, Rfl: 2 .  FLUoxetine (PROZAC) 20 MG capsule, Take 1 capsule (20 mg total) by mouth daily., Disp: 30 capsule, Rfl: 5 .  fluticasone (FLONASE) 50 MCG/ACT nasal spray, Place 2 sprays into both nostrils daily., Disp: 16 g, Rfl: 6 .  gabapentin (NEURONTIN) 600 MG tablet, TAKE 1 TABLET (600 MG TOTAL) BY MOUTH AT BEDTIME., Disp: 30 tablet, Rfl: 1 .  hydrOXYzine (ATARAX/VISTARIL) 25 MG tablet, TAKE 1 TABLET (25 MG TOTAL) BY MOUTH EVERY 8 (EIGHT) HOURS AS NEEDED FOR ANXIETY., Disp: 180 tablet, Rfl: 0 .  levETIRAcetam (KEPPRA) 500 MG tablet, Take 1 tablet (500 mg total) by mouth 2 (two) times daily., Disp: 120 tablet, Rfl: 11 .  meloxicam (MOBIC) 7.5 MG tablet, Take 1 tablet (7.5 mg total) by mouth daily. arthritis, Disp: 30  tablet, Rfl: 2 .  metoprolol tartrate (LOPRESSOR) 25 MG tablet, TAKE 1 TABLET BY MOUTH TWICE A DAY, Disp: 60 tablet, Rfl: 3 .  omeprazole (PRILOSEC) 20 MG capsule, Take 1 capsule (20 mg total) by mouth daily. heartburn, Disp: 90 capsule, Rfl: 0 .  tiZANidine (ZANAFLEX) 2 MG tablet, TAKE 1/2-1 TABLET EVERY 8 HOURS AS NEEDED FOR MUSCLE SPASMS (USE SPARINGLY, CAUSES SEDATION/ FALLS)., Disp: 60 tablet, Rfl: 1 .  traZODone (DESYREL) 150 MG tablet, TAKE 1/2 TO 1 TABLET AT BEDTIME AS NEEDED FOR SLEEP, Disp: 30 tablet, Rfl: 12  No Known Allergies Past Medical History:  Diagnosis Date  . Anxiety   . Bipolar disorder (HCC)   . Chronic lower back pain   . DDD (degenerative disc disease), lumbar   . Depression   . Disability of walking   . Headache    "about q wk" (10/09/2017)  . Hypertension   . Seizure (HCC) 10/05/2017   "that was my 1st" (10/09/2017)  . 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 Tinnitus, unspecified laterality - Ambulatory referral to Audiology   Follow Up Instructions:  I discussed the assessment and treatment plan with the patient. The patient was provided an opportunity to ask questions and all were answered. The patient agreed with the plan and demonstrated an understanding of the  instructions.   The patient was advised to call back or seek an in-person evaluation if the symptoms worsen or if the condition fails to improve as anticipated.  The above assessment and management plan was discussed with the patient. The patient verbalized understanding of and has agreed to the management plan. Patient is aware to call the clinic if symptoms persist or worsen. Patient is aware when to return to the clinic for a follow-up visit. Patient educated on when it is appropriate to go to the emergency department.   Time call  ended: 9:44 AM  I provided 9 minutes of non-face-to-face time during this encounter.  Deliah Boston, MSN, APRN, FNP-C [facility] 01/18/20

## 2020-01-24 ENCOUNTER — Other Ambulatory Visit: Payer: Self-pay | Admitting: Family Medicine

## 2020-01-24 DIAGNOSIS — M5136 Other intervertebral disc degeneration, lumbar region: Secondary | ICD-10-CM

## 2020-01-24 DIAGNOSIS — F331 Major depressive disorder, recurrent, moderate: Secondary | ICD-10-CM

## 2020-02-02 NOTE — Progress Notes (Deleted)
Referring Provider: [name], DO Primary Care Physician:  [name], DO Primary Gastroenterologist:  Dr. Bonnetta Barry chief complaint on file.   HPI:   Betty Nelson is a 59 y.o. female presenting today at the request of Gottschalk, [name], DO for anemia in the setting of alcoholism, question GI losses.   Reviewed recent labs completed 11/02/2019.  Hemoglobin 10.9 with normocytic indices.  This was down from 13 in November 2020.  Platelets within normal limits.  LFTs, kidney function, electrolytes within normal limits.  B12 and folate within normal limits.  TSH within normal limits.  Recommend referring to GI for further evaluation.   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   Today:   Past Medical History:  Diagnosis Date  . Anxiety   . Bipolar disorder (HCC)   . Chronic lower back pain   . DDD (degenerative disc disease), lumbar   . Depression   . Disability of walking   . Headache    "about q wk" (10/09/2017)  . Hypertension   . Seizure (HCC) 10/05/2017   "that was my 1st" (10/09/2017)  . 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 CESAREAN SECTION  1985  . TONSILLECTOMY      Current Outpatient Medications  Medication Sig Dispense Refill  . fexofenadine (ALLEGRA ALLERGY) 180 MG tablet Take 1 tablet (180 mg total) by mouth daily. 90 tablet 2  . FLUoxetine (PROZAC) 20 MG capsule Take 1 capsule (20 mg total) by mouth daily. (Needs to be seen before next refill) 30 capsule 0  . fluticasone (FLONASE) 50 MCG/ACT nasal spray Place 2 sprays into both nostrils daily. 16 g 6  . gabapentin (NEURONTIN) 600 MG tablet Take 1 tablet (600 mg total) by mouth at bedtime. (Needs to be seen before next refill) 30 tablet 0  . hydrOXYzine (ATARAX/VISTARIL) 25 MG tablet TAKE 1 TABLET (25 MG  TOTAL) BY MOUTH EVERY 8 (EIGHT) HOURS AS NEEDED FOR ANXIETY. 180 tablet 0  . levETIRAcetam (KEPPRA) 500 MG tablet Take 1 tablet (500 mg total) by mouth 2 (two) times daily. 120 tablet 11  . meloxicam (MOBIC) 7.5 MG tablet Take 1 tablet (7.5 mg total) by mouth daily. arthritis 30 tablet 2  . metoprolol tartrate (LOPRESSOR) 25 MG tablet TAKE 1 TABLET BY MOUTH TWICE A DAY 60 tablet 3  . omeprazole (PRILOSEC) 20 MG capsule Take 1 capsule (20 mg total) by mouth daily. heartburn 90 capsule 0  . tiZANidine (ZANAFLEX) 2 MG tablet TAKE 1/2-1 TABLET EVERY 8 HOURS AS NEEDED FOR MUSCLE SPASMS (USE SPARINGLY, CAUSES SEDATION/ FALLS). 60 tablet 1  . traZODone (DESYREL) 150 MG tablet TAKE 1/2 TO 1 TABLET AT BEDTIME AS NEEDED FOR SLEEP 30 tablet 12   No current facility-administered medications for this visit.    Allergies as of 02/03/2020  . (No Known Allergies)    Family History  Problem Relation Age of Onset  . Heart disease Mother   . Hyperlipidemia Mother   . Heart disease Father   . Hyperlipidemia Father     Social History   Socioeconomic History  . Marital status: Married    Spouse name: Not on file  . Number of children: Not on file  . Years of education: Not on file  . Highest education level: Not on file  Occupational History  .  Not on file  Tobacco Use  . Smoking status: Current Every Day Smoker    Packs/day: 0.33    Years: 43.00    Pack years: 14.19    Types: Cigarettes  . Smokeless tobacco: Former Neurosurgeon    Types: Snuff  Vaping Use  . Vaping Use: Never used  Substance and Sexual Activity  . Alcohol use: Yes    Alcohol/week: 2.0 standard drinks    Types: 2 Cans of beer per week    Comment: 10/09/2017 "~ 2 beers/wk"  . Drug use: No  . Sexual activity: Not Currently  Other Topics Concern  . Not on file  Social History Narrative  . Not on file   Social Determinants of Health   Financial Resource Strain:   . Difficulty of Paying Living Expenses: Not on file  Food  Insecurity:   . Worried About Programme researcher, broadcasting/film/video in the Last Year: Not on file  . Ran Out of Food in the Last Year: Not on file  Transportation Needs:   . Lack of Transportation (Medical): Not on file  . Lack of Transportation (Non-Medical): Not on file  Physical Activity:   . Days of Exercise per Week: Not on file  . Minutes of Exercise per Session: Not on file  Stress:   . Feeling of Stress : Not on file  Social Connections:   . Frequency of Communication with Friends and Family: Not on file  . Frequency of Social Gatherings with Friends and Family: Not on file  . Attends Religious Services: Not on file  . Active Member of Clubs or Organizations: Not on file  . Attends Banker Meetings: Not on file  . Marital Status: Not on file  Intimate Partner Violence:   . Fear of Current or Ex-Partner: Not on file  . Emotionally Abused: Not on file  . Physically Abused: Not on file  . Sexually Abused: Not on file    Review of Systems: Gen: Denies any fever, chills, fatigue, weight loss, lack of appetite.  CV: Denies chest pain, heart palpitations, peripheral edema, syncope.  Resp: Denies shortness of breath at rest or with exertion. Denies wheezing or cough.  GI: Denies dysphagia or odynophagia. Denies jaundice, hematemesis, fecal incontinence. GU : Denies urinary burning, urinary frequency, urinary hesitancy MS: Denies joint pain, muscle weakness, cramps, or limitation of movement.  Derm: Denies rash, itching, dry skin Psych: Denies depression, anxiety, memory loss, and confusion Heme: Denies bruising, bleeding, and enlarged lymph nodes.  Physical Exam: There were no vitals taken for this visit. General:   Alert and oriented. Pleasant and cooperative. Well-nourished and well-developed.  Head:  Normocephalic and atraumatic. Eyes:  Without icterus, sclera clear and conjunctiva pink.  Ears:  Normal auditory acuity. Nose:  No deformity, discharge,  or lesions. Mouth:  No  deformity or lesions, oral mucosa pink.  Neck:  Supple, without mass or thyromegaly. Lungs:  Clear to auscultation bilaterally. No wheezes, rales, or rhonchi. No distress.  Heart:  S1, S2 present without murmurs appreciated.  Abdomen:  +BS, soft, non-tender and non-distended. No HSM noted. No guarding or rebound. No masses appreciated.  Rectal:  Deferred  Msk:  Symmetrical without gross deformities. Normal posture. Pulses:  Normal pulses noted. Extremities:  Without clubbing or edema. Neurologic:  Alert and  oriented x4;  grossly normal neurologically. Skin:  Intact without significant lesions or rashes. Cervical Nodes:  No significant cervical adenopathy. Psych:  Alert and cooperative. Normal mood and affect.

## 2020-02-03 ENCOUNTER — Ambulatory Visit: Payer: Medicare Other | Admitting: Gastroenterology

## 2020-02-04 ENCOUNTER — Ambulatory Visit: Payer: Medicare Other | Admitting: *Deleted

## 2020-02-04 DIAGNOSIS — R630 Anorexia: Secondary | ICD-10-CM

## 2020-02-04 DIAGNOSIS — F1027 Alcohol dependence with alcohol-induced persisting dementia: Secondary | ICD-10-CM

## 2020-02-04 DIAGNOSIS — F1020 Alcohol dependence, uncomplicated: Secondary | ICD-10-CM

## 2020-02-04 DIAGNOSIS — F331 Major depressive disorder, recurrent, moderate: Secondary | ICD-10-CM

## 2020-02-04 NOTE — Patient Instructions (Signed)
Visit Information  Goals Addressed            This Visit's Progress   . Chronic Disease Management Needs       CARE PLAN ENTRY (see longtitudinal plan of care for additional care plan information)  Current Barriers:  . Chronic Disease Management support, education, and care coordination needs related to HTN, bipolar disorder, MDD, seizures, dementia associated with alcoholism, hx of CVA  Clinical Goal(s) related to HTN, bipolar disorder, MDD, seizures, dementia associated with alcoholism, hx of CVA:  Over the next 30 days, patient will:  . Work with the care management team to address educational, disease management, and care coordination needs  . Call care management team with questions or concerns . Verbalize basic understanding of patient centered plan of care established today  Interventions related to HTN, bipolar disorder, MDD, seizures, dementia associated with alcoholism, hx of CVA:  . Evaluation of current treatment plans and patient's adherence to plan as established by provider . Assessed patient understanding of disease states . Assessed patient's education and care coordination needs . Provided disease specific education to patient  . Collaborated with appropriate clinical care team members regarding patient needs . Talked with patient and daughter, [name], by telephone today . Chart reviewed including recent office notes and lab result . Discussed recent lab results . Discussed diet o Poor appetite and doesn't eat very much at one time o Has some nutritional supplements (Boost, Ensure, etc) but doesn't drink them daily . Discussed alcoholism and alcohol intake o Normally drinks 2 to 3 beers a day. Has drank liquor daily in the past but not for years o Has not had any alcohol over the past 3 weeks - No withdrawal symptoms . Discussed mobility and physical activity level o Has some balance/gait disturbance o Uses cane but not for all ambulation o Walks around  outside of her home several times a week . Discussed ability to perform ADLs o Often requires assistance with ADLs. Daughter helps.  o Daughter provides transportation to appts . Discussed family/social support o Lives with husband adult daughter o Daughter helps with ADLs and IADLs . Reviewed and discussed medications . Discussed oral health o Has not had a dental visit in > 1 year o Has dentures but also has two natural teeth remaining and they are broken o Discussed gum pain . Discussed need for dental care o Discussed that she doesn't have dental insurance - Local dentists may have a payment plan option o Referral to Care Guides to assist with locating a free or reduced cost dental clinic . Discussed anxiety/depression o Recommended consult with LCSW. Declined at this time.  Durward Fortes of need for f/u appt with PCP o Daughter will call to schedule with Dr Lajuana Ripple . Reviewed upcoming appointments with neurology and GI . Advised to reach out to PCP with any new or worsening symptoms . Provided with RN Care Manager contact number and encouraged to reach out as needed  Patient Self Care Activities related to HTN, bipolar disorder, MDD, seizures, dementia associated with alcoholism, hx of CVA:  . Patient is unable to independently self-manage chronic health conditions  Initial goal documentation     . COMPLETED: Syncopal Episdoes       CARE PLAN ENTRY (see longtitudinal plan of care for additional care plan information)  Syncopal episodes in patient with HTN  Current Barriers:  Marland Kitchen Knowledge Deficits related to syncopal episodes . Film/video editor.   Nurse Case Manager Clinical Goal(s):  .  Over the next 5 days, patient will work with PCP office regarding syncopal episodes  Interventions:  . Consulted by Care Guide after patient/daughter was contacted regarding CCM services o Patient declined neurology referral due to concerns about cost o Patient would now like to  see neurologist . Chart reviewed o Referral to neurologist was created in 02/2019 o Last office visit was 05/2019 and syncopal episodes weren't discussed . Communication sent to Dr Lajuana Ripple for review and recommendation . RN will f/u during scheduled Initial Visit or sooner if necessary  Patient Self Care Activities:  . Unknown to me at this time  Initial goal documentation        Ms. Luviano was given information about Chronic Care Management services today including:  1. CCM service includes personalized support from designated clinical staff supervised by her physician, including individualized plan of care and coordination with other care providers 2. 24/7 contact phone numbers for assistance for urgent and routine care needs. 3. Service will only be billed when office clinical staff spend 20 minutes or more in a month to coordinate care. 4. Only one practitioner may furnish and bill the service in a calendar month. 5. The patient may stop CCM services at any time (effective at the end of the month) by phone call to the office staff. 6. The patient will be responsible for cost sharing (co-pay) of up to 20% of the service fee (after annual deductible is met).  Patient agreed to services and verbal consent obtained.   Patient verbalizes understanding of instructions provided today.    Plan:   Telephone follow up appointment with care management team member scheduled for: 02/22/20 with RN Care Manager Daughter will schedule f/u with PCP  Chong Sicilian, BSN, RN-BC [place] [facility] [place] Management Direct Dial: [phone]

## 2020-02-04 NOTE — Chronic Care Management (AMB) (Signed)
Chronic Care Management   Initial Visit Note  02/04/2020 Name: Betty Nelson MRN: [id number] DOB: [birth year]  Referred by: Janora Norlander, DO Reason for referral : Chronic Care Management (Initial Visit)   Betty Nelson is a 59 y.o. year old female who is a primary care patient of Janora Norlander, DO. The CCM team was consulted for assistance with chronic disease management and care coordination needs related to HTN, bipolar disorder, MDD, seizures, dementia associated with alcoholism, hx of CVA.  Review of patient status, including review of consultants reports, relevant laboratory and other test results, and collaboration with appropriate care team members and the patient's provider was performed as part of comprehensive patient evaluation and provision of chronic care management services.    Subjective: I spoke with Betty Nelson and her daughter, Betty Nelson, by telephone today regarding management of her chronic medical conditions.   SDOH (Social Determinants of Health) assessments performed: Yes See Care Plan activities for detailed interventions related to SDOH  SDOH Interventions     Most Recent Value  SDOH Interventions  Stress Interventions Other (Comment)  [recommended consult with LCSW. Patient declined. On medication.]  Alcohol Brief Interventions/Follow-up Alcohol Education  [No alcohol over the past 3 weeks]  Depression Interventions/Treatment  Currently on Treatment  [recommended consultatoin with LCSW. Patient declines.]       Objective: Outpatient Encounter Medications as of 02/04/2020  Medication Sig  . fexofenadine (ALLEGRA ALLERGY) 180 MG tablet Take 1 tablet (180 mg total) by mouth daily.  Marland Kitchen FLUoxetine (PROZAC) 20 MG capsule Take 1 capsule (20 mg total) by mouth daily. (Needs to be seen before next refill)  . fluticasone (FLONASE) 50 MCG/ACT nasal spray Place 2 sprays into both nostrils daily.  Marland Kitchen gabapentin (NEURONTIN) 600 MG tablet Take 1 tablet (600 mg  total) by mouth at bedtime. (Needs to be seen before next refill)  . hydrOXYzine (ATARAX/VISTARIL) 25 MG tablet TAKE 1 TABLET (25 MG TOTAL) BY MOUTH EVERY 8 (EIGHT) HOURS AS NEEDED FOR ANXIETY.  . levETIRAcetam (KEPPRA) 500 MG tablet Take 1 tablet (500 mg total) by mouth 2 (two) times daily.  . meloxicam (MOBIC) 7.5 MG tablet Take 1 tablet (7.5 mg total) by mouth daily. arthritis  . metoprolol tartrate (LOPRESSOR) 25 MG tablet TAKE 1 TABLET BY MOUTH TWICE A DAY  . omeprazole (PRILOSEC) 20 MG capsule Take 1 capsule (20 mg total) by mouth daily. heartburn  . tiZANidine (ZANAFLEX) 2 MG tablet TAKE 1/2-1 TABLET EVERY 8 HOURS AS NEEDED FOR MUSCLE SPASMS (USE SPARINGLY, CAUSES SEDATION/ FALLS).  Marland Kitchen traZODone (DESYREL) 150 MG tablet TAKE 1/2 TO 1 TABLET AT BEDTIME AS NEEDED FOR SLEEP   No facility-administered encounter medications on file as of 02/04/2020.     BP Readings from Last 3 Encounters:  11/02/19 (!) 115/58  07/23/19 120/74  02/16/19 122/76   Wt Readings from Last 3 Encounters:  11/02/19 103 lb (46.7 kg)  07/23/19 110 lb (49.9 kg)  02/16/19 109 lb 9.6 oz (49.7 kg)   BMI Readings from Last 3 Encounters:  11/02/19 17.14 kg/m  07/23/19 18.30 kg/m  02/16/19 19.41 kg/m   Lab Results  Component Value Date   FOLATE 8.1 11/02/2019   Lab Results  Component Value Date   VITAMINB12 402 11/02/2019      Goals Addressed            This Visit's Progress   . Chronic Disease Management Needs       CARE PLAN ENTRY (see longtitudinal  plan of care for additional care plan information)  Current Barriers:  . Chronic Disease Management support, education, and care coordination needs related to HTN, bipolar disorder, MDD, seizures, dementia associated with alcoholism, hx of CVA  Clinical Goal(s) related to HTN, bipolar disorder, MDD, seizures, dementia associated with alcoholism, hx of CVA:  Over the next 30 days, patient will:  . Work with the care management team to address  educational, disease management, and care coordination needs  . Call care management team with questions or concerns . Verbalize basic understanding of patient centered plan of care established today  Interventions related to HTN, bipolar disorder, MDD, seizures, dementia associated with alcoholism, hx of CVA:  . Evaluation of current treatment plans and patient's adherence to plan as established by provider . Assessed patient understanding of disease states . Assessed patient's education and care coordination needs . Provided disease specific education to patient  . Collaborated with appropriate clinical care team members regarding patient needs . Talked with patient and daughter, Betty Nelson, by telephone today . Chart reviewed including recent office notes and lab result . Discussed recent lab results . Discussed diet o Poor appetite and doesn't eat very much at one time o Has some nutritional supplements (Boost, Ensure, etc) but doesn't drink them daily . Discussed alcoholism and alcohol intake o Normally drinks 2 to 3 beers a day. Has drank liquor daily in the past but not for years o Has not had any alcohol over the past 3 weeks - No withdrawal symptoms . Discussed mobility and physical activity level o Has some balance/gait disturbance o Uses cane but not for all ambulation o Walks around outside of her home several times a week . Discussed ability to perform ADLs o Often requires assistance with ADLs. Daughter helps.  o Daughter provides transportation to appts . Discussed family/social support o Lives with husband adult daughter o Daughter helps with ADLs and IADLs . Reviewed and discussed medications . Discussed oral health o Has not had a dental visit in > 1 year o Has dentures but also has two natural teeth remaining and they are broken o Discussed gum pain . Discussed need for dental care o Discussed that she doesn't have dental insurance - Local dentists may have a  payment plan option o Referral to Care Guides to assist with locating a free or reduced cost dental clinic . Discussed anxiety/depression o Recommended consult with LCSW. Declined at this time.  Durward Fortes of need for f/u appt with PCP o Daughter will call to schedule with Dr Lajuana Ripple . Reviewed upcoming appointments with neurology and GI . Advised to reach out to PCP with any new or worsening symptoms . Provided with RN Care Manager contact number and encouraged to reach out as needed  Patient Self Care Activities related to HTN, bipolar disorder, MDD, seizures, dementia associated with alcoholism, hx of CVA:  . Patient is unable to independently self-manage chronic health conditions  Initial goal documentation     . COMPLETED: Syncopal Episdoes       CARE PLAN ENTRY (see longtitudinal plan of care for additional care plan information)  Syncopal episodes in patient with HTN  Current Barriers:  Marland Kitchen Knowledge Deficits related to syncopal episodes . Film/video editor.   Nurse Case Manager Clinical Goal(s):  Marland Kitchen Over the next 5 days, patient will work with PCP office regarding syncopal episodes  Interventions:  . Consulted by Care Guide after patient/daughter was contacted regarding CCM services o Patient declined neurology referral due  to concerns about cost o Patient would now like to see neurologist . Chart reviewed o Referral to neurologist was created in 02/2019 o Last office visit was 05/2019 and syncopal episodes weren't discussed . Communication sent to Dr Lajuana Ripple for review and recommendation . RN will f/u during scheduled Initial Visit or sooner if necessary  Patient Self Care Activities:  . Unknown to me at this time  Initial goal documentation         Betty. Arcidiacono was given information about Chronic Care Management services today including:  1. CCM service includes personalized support from designated clinical staff supervised by her physician, including  individualized plan of care and coordination with other care providers 2. 24/7 contact phone numbers for assistance for urgent and routine care needs. 3. Service will only be billed when office clinical staff spend 20 minutes or more in a month to coordinate care. 4. Only one practitioner may furnish and bill the service in a calendar month. 5. The patient may stop CCM services at any time (effective at the end of the month) by phone call to the office staff. 6. The patient will be responsible for cost sharing (co-pay) of up to 20% of the service fee (after annual deductible is met).  Patient/daughter verbally agreed to assistance and services provided by embedded care coordination/care management team today.  Plan:   Telephone follow up appointment with care management team member scheduled for: 02/22/20 with RN Care Manager Daughter will schedule f/u with PCP  Chong Sicilian, BSN, RN-BC [place] [facility] [place] Management Direct Dial: [phone]

## 2020-02-10 ENCOUNTER — Telehealth: Payer: Self-pay | Admitting: Family Medicine

## 2020-02-10 NOTE — Telephone Encounter (Signed)
   SF 02/10/2020   Name: Betty Nelson Weight   MRN: [id number]   DOB: 06-Jul-1960   AGE: 59 y.o.   GENDER: female   PCP [name], DO.    Called patient's daughter [name] Caul.  Left messages regarding referral for [name] for help with finding dental assistance. Will try to give patient a call back within the week.   [facility] Care Guide, Embedded Care Coordination [facility], Care Management Phone: [phone] Email: [email or web address]

## 2020-02-13 ENCOUNTER — Other Ambulatory Visit: Payer: Self-pay | Admitting: Family Medicine

## 2020-02-13 DIAGNOSIS — F419 Anxiety disorder, unspecified: Secondary | ICD-10-CM

## 2020-02-15 ENCOUNTER — Ambulatory Visit: Payer: Medicare Other | Admitting: Adult Health

## 2020-02-15 NOTE — Telephone Encounter (Signed)
° °  SF 02/15/2020    Name: Betty Nelson    MRN: [id number]    DOB: 09-Oct-1960    AGE: 59 y.o.    GENDER: female    PCP [name], DO.    Called patient's daughter Betty Nelson regarding referral for he mother Betty Nelson for assistance with dental resources. Left message for Betty Poag to give Care Guide a call back. Also sent Betty Poag an e-mail of the resource letter for dental assistance.   From: [email or web address] To: [email or web address] Subject: SECURE: Dental Resources in [place]  Hi Ms. Baack,    Below is a dental resource that could possibly help your mother Betty Nelson. Please feel free to give me a call if you have any questions or concerns.     [facility] [address] [place], [place] [address] [phone]  Clinic is open Monday - Thursday, 7:30am - 4pm The clinic does not accept Medicare, but they do charge on a sliding scale as low as 60 %.  Cost can be $135-225 for x-ray and exam, but treatment will be an additional cost.    Thanks,    [facility] Guide, Embedded Care Coordination [facility], Care Management Phone: [phone] Email: [email or web address]    Danelle Berry Care Guide, Embedded Care Coordination [facility], Care Management Phone: [phone] Email: [email or web address]

## 2020-02-16 NOTE — Telephone Encounter (Signed)
   SF 02/16/2020   Name: Betty Nelson   MRN: [id number]   DOB: 06-Aug-1960   AGE: 59 y.o.   GENDER: female   PCP [name], DO.    Spoke with patient's daughter Betty Nelson  today regarding dental resources for Ms. Betty Nelson. Betty Nelson stated that she received the e-mail for [facility] and that she will give them a call. Explained to Betty Nelson that the [facility] may be a good place to start to get her mother the dental care she needs.  Betty Nelson stated understanding. She asked that that if Care Guide finds additional resources if information can be e-mailed to her and she also stated that her mother Betty Nelson has no additional needs at this time.    Closing referral pending any other needs of patient.    [facility] Care Guide, Embedded Care Coordination [facility], Care Management Phone: [phone] Email: [email or web address]

## 2020-02-19 ENCOUNTER — Other Ambulatory Visit: Payer: Self-pay | Admitting: Family Medicine

## 2020-02-19 DIAGNOSIS — M5136 Other intervertebral disc degeneration, lumbar region: Secondary | ICD-10-CM

## 2020-02-22 ENCOUNTER — Other Ambulatory Visit: Payer: Self-pay | Admitting: Family Medicine

## 2020-02-22 ENCOUNTER — Telehealth: Payer: Self-pay

## 2020-02-22 ENCOUNTER — Telehealth: Payer: Medicare Other | Admitting: *Deleted

## 2020-02-22 DIAGNOSIS — Z8673 Personal history of transient ischemic attack (TIA), and cerebral infarction without residual deficits: Secondary | ICD-10-CM | POA: Diagnosis not present

## 2020-02-22 DIAGNOSIS — F17200 Nicotine dependence, unspecified, uncomplicated: Secondary | ICD-10-CM | POA: Diagnosis not present

## 2020-02-22 DIAGNOSIS — I10 Essential (primary) hypertension: Secondary | ICD-10-CM | POA: Diagnosis not present

## 2020-02-22 DIAGNOSIS — M5136 Other intervertebral disc degeneration, lumbar region: Secondary | ICD-10-CM

## 2020-02-22 DIAGNOSIS — F331 Major depressive disorder, recurrent, moderate: Secondary | ICD-10-CM

## 2020-02-22 DIAGNOSIS — H9313 Tinnitus, bilateral: Secondary | ICD-10-CM | POA: Diagnosis not present

## 2020-02-22 MED ORDER — GABAPENTIN 600 MG PO TABS: 600.0000 mg | ORAL_TABLET | Freq: Every day | ORAL | 1 refills | Status: DC

## 2020-02-22 NOTE — Telephone Encounter (Signed)
  Prescription Request  02/22/2020  What is the name of the medication or equipment? gabapentin (NEURONTIN) 600 MG tablet    Have you contacted your pharmacy to request a refill? (if applicable) yes  Which pharmacy would you like this sent to? [facility], pt has appt on 03/29/20 with Dr. Reece Agar   Patient notified that their request is being sent to the clinical staff for review and that they should receive a response within 2 business days.

## 2020-02-22 NOTE — Telephone Encounter (Signed)
[place]. NTBS 30 days given 01/25/20

## 2020-02-23 ENCOUNTER — Telehealth: Payer: Self-pay

## 2020-02-23 NOTE — Telephone Encounter (Signed)
Spoke with Betty Nelson  Advised avoid stress and loud background noises and keep appointment scheduled for tomorrow

## 2020-02-23 NOTE — Telephone Encounter (Signed)
Patient has an appointment in office tomorrow.

## 2020-02-24 ENCOUNTER — Ambulatory Visit: Payer: Medicare Other | Admitting: Nurse Practitioner

## 2020-02-24 IMAGING — CR DG CHEST 1V
1 series · 1 of 1 positions shown · non-contrast
Comparison: none

CLINICAL DATA: Siezure, unresponsive, unable to cooperate, held for
film

EXAM:
CHEST  1 VIEW

[ap]
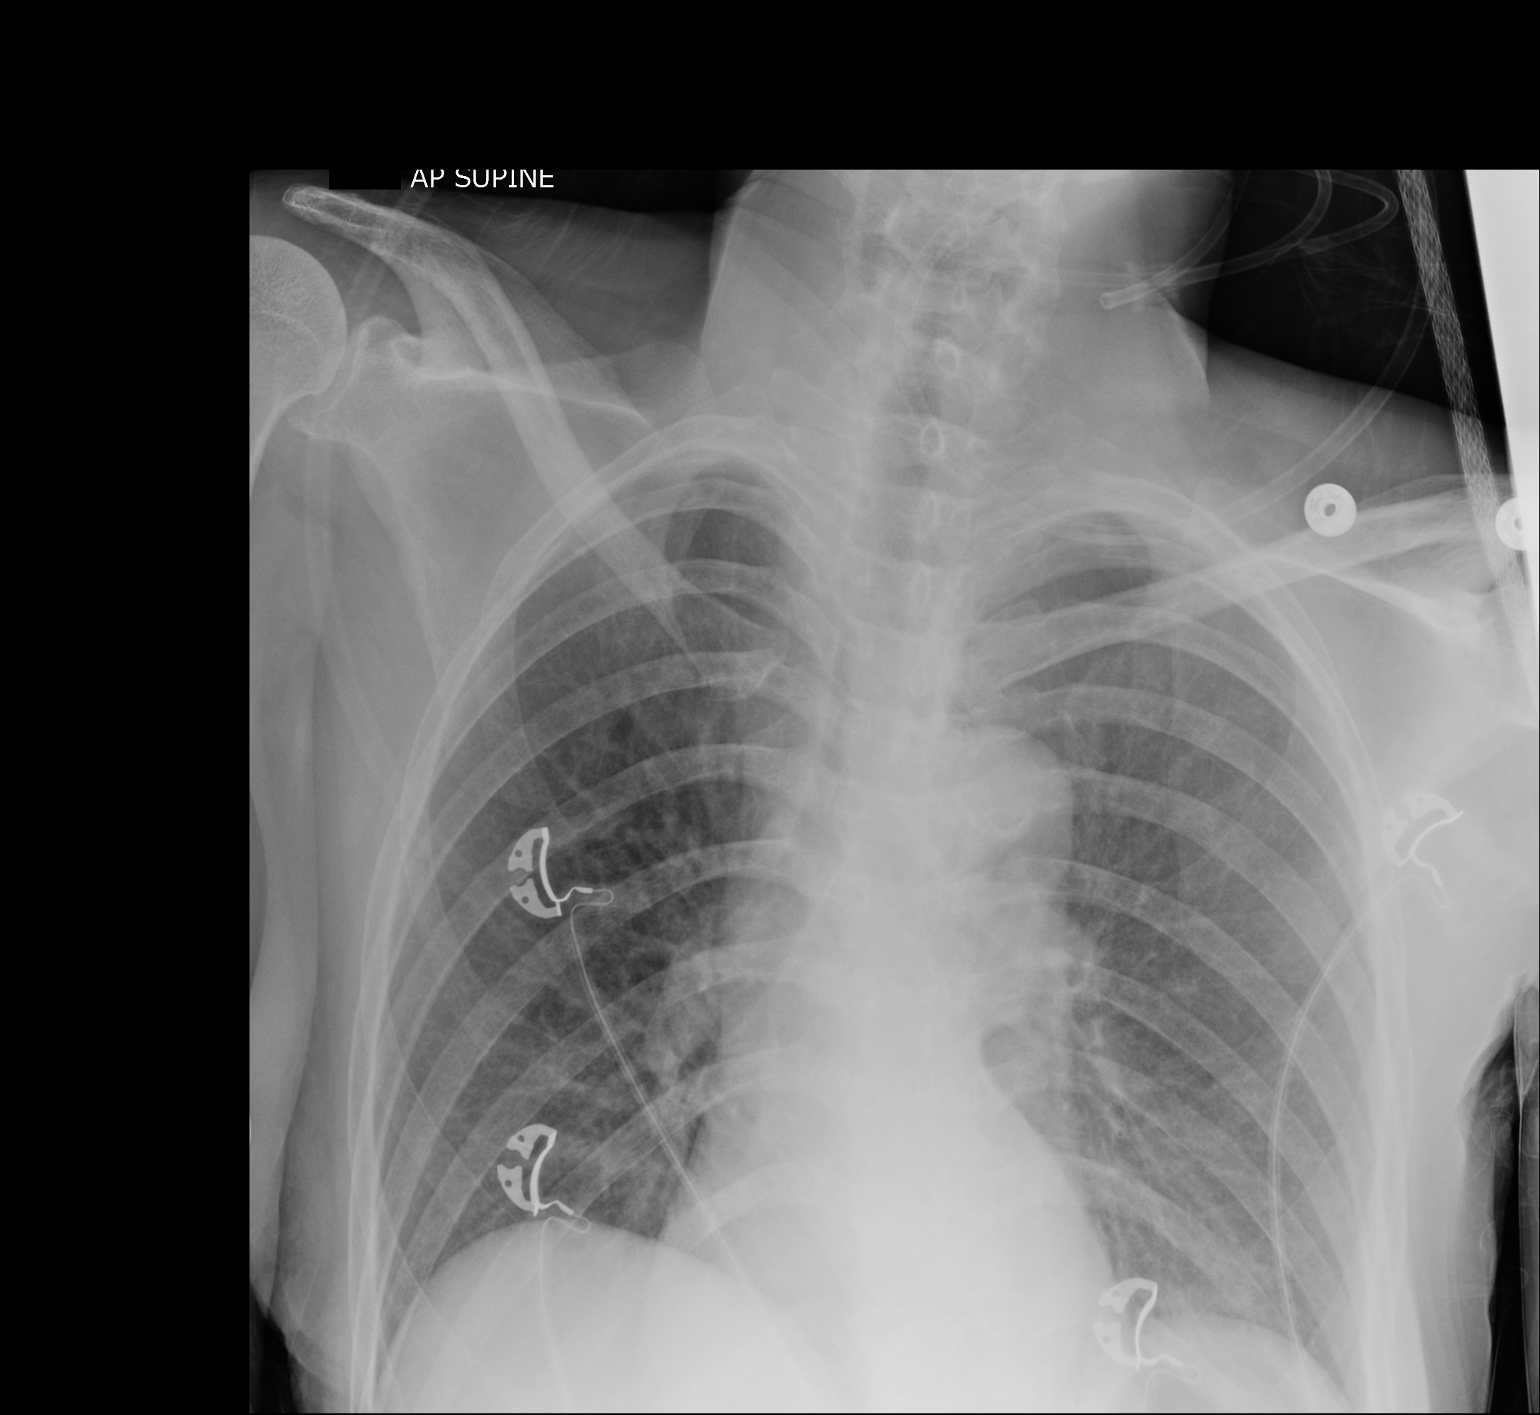

[1 of 1 positions shown; findings below may reference images not displayed]

FINDINGS: Lungs are clear.

Heart size and mediastinal contours are within normal limits. Aortic
Atherosclerosis ([id number]-170.0).

No effusion.

Visualized bones unremarkable.
IMPRESSION: No acute cardiopulmonary disease.

## 2020-02-27 DIAGNOSIS — F17200 Nicotine dependence, unspecified, uncomplicated: Secondary | ICD-10-CM | POA: Diagnosis not present

## 2020-02-27 DIAGNOSIS — F419 Anxiety disorder, unspecified: Secondary | ICD-10-CM | POA: Diagnosis not present

## 2020-02-27 DIAGNOSIS — J984 Other disorders of lung: Secondary | ICD-10-CM | POA: Diagnosis not present

## 2020-02-27 DIAGNOSIS — I10 Essential (primary) hypertension: Secondary | ICD-10-CM | POA: Diagnosis not present

## 2020-02-27 DIAGNOSIS — R319 Hematuria, unspecified: Secondary | ICD-10-CM | POA: Diagnosis not present

## 2020-02-27 DIAGNOSIS — I6201 Nontraumatic acute subdural hemorrhage: Secondary | ICD-10-CM | POA: Diagnosis not present

## 2020-02-27 DIAGNOSIS — R410 Disorientation, unspecified: Secondary | ICD-10-CM | POA: Diagnosis not present

## 2020-02-27 DIAGNOSIS — N179 Acute kidney failure, unspecified: Secondary | ICD-10-CM | POA: Diagnosis not present

## 2020-02-27 DIAGNOSIS — Z4682 Encounter for fitting and adjustment of non-vascular catheter: Secondary | ICD-10-CM | POA: Diagnosis not present

## 2020-02-27 DIAGNOSIS — R52 Pain, unspecified: Secondary | ICD-10-CM | POA: Diagnosis not present

## 2020-02-27 DIAGNOSIS — E870 Hyperosmolality and hypernatremia: Secondary | ICD-10-CM | POA: Diagnosis not present

## 2020-02-27 DIAGNOSIS — R402421 Glasgow coma scale score 9-12, in the field [EMT or ambulance]: Secondary | ICD-10-CM | POA: Diagnosis not present

## 2020-02-27 DIAGNOSIS — Z20822 Contact with and (suspected) exposure to covid-19: Secondary | ICD-10-CM | POA: Diagnosis not present

## 2020-02-27 DIAGNOSIS — N390 Urinary tract infection, site not specified: Secondary | ICD-10-CM | POA: Diagnosis not present

## 2020-02-27 DIAGNOSIS — R6520 Severe sepsis without septic shock: Secondary | ICD-10-CM | POA: Diagnosis not present

## 2020-02-27 DIAGNOSIS — J9690 Respiratory failure, unspecified, unspecified whether with hypoxia or hypercapnia: Secondary | ICD-10-CM | POA: Diagnosis not present

## 2020-02-27 DIAGNOSIS — R404 Transient alteration of awareness: Secondary | ICD-10-CM | POA: Diagnosis not present

## 2020-02-27 DIAGNOSIS — R0902 Hypoxemia: Secondary | ICD-10-CM | POA: Diagnosis not present

## 2020-02-27 DIAGNOSIS — Z8673 Personal history of transient ischemic attack (TIA), and cerebral infarction without residual deficits: Secondary | ICD-10-CM | POA: Diagnosis not present

## 2020-02-27 DIAGNOSIS — Z79899 Other long term (current) drug therapy: Secondary | ICD-10-CM | POA: Diagnosis not present

## 2020-02-27 DIAGNOSIS — R9431 Abnormal electrocardiogram [ECG] [EKG]: Secondary | ICD-10-CM | POA: Diagnosis not present

## 2020-02-27 DIAGNOSIS — R4182 Altered mental status, unspecified: Secondary | ICD-10-CM | POA: Diagnosis not present

## 2020-02-27 DIAGNOSIS — I614 Nontraumatic intracerebral hemorrhage in cerebellum: Secondary | ICD-10-CM | POA: Diagnosis not present

## 2020-02-27 DIAGNOSIS — I6200 Nontraumatic subdural hemorrhage, unspecified: Secondary | ICD-10-CM | POA: Diagnosis not present

## 2020-02-27 DIAGNOSIS — A419 Sepsis, unspecified organism: Secondary | ICD-10-CM | POA: Diagnosis not present

## 2020-02-27 DIAGNOSIS — E876 Hypokalemia: Secondary | ICD-10-CM | POA: Diagnosis not present

## 2020-02-28 DIAGNOSIS — N179 Acute kidney failure, unspecified: Secondary | ICD-10-CM | POA: Diagnosis not present

## 2020-02-28 DIAGNOSIS — A419 Sepsis, unspecified organism: Secondary | ICD-10-CM | POA: Diagnosis not present

## 2020-02-28 DIAGNOSIS — S065X0A Traumatic subdural hemorrhage without loss of consciousness, initial encounter: Secondary | ICD-10-CM | POA: Diagnosis not present

## 2020-02-28 DIAGNOSIS — Z4659 Encounter for fitting and adjustment of other gastrointestinal appliance and device: Secondary | ICD-10-CM | POA: Diagnosis not present

## 2020-02-28 DIAGNOSIS — J9600 Acute respiratory failure, unspecified whether with hypoxia or hypercapnia: Secondary | ICD-10-CM | POA: Diagnosis not present

## 2020-02-28 DIAGNOSIS — I6200 Nontraumatic subdural hemorrhage, unspecified: Secondary | ICD-10-CM | POA: Diagnosis not present

## 2020-02-28 DIAGNOSIS — Z4682 Encounter for fitting and adjustment of non-vascular catheter: Secondary | ICD-10-CM | POA: Diagnosis not present

## 2020-02-28 DIAGNOSIS — J9601 Acute respiratory failure with hypoxia: Secondary | ICD-10-CM | POA: Diagnosis not present

## 2020-02-28 DIAGNOSIS — S065X9A Traumatic subdural hemorrhage with loss of consciousness of unspecified duration, initial encounter: Secondary | ICD-10-CM | POA: Diagnosis not present

## 2020-02-28 DIAGNOSIS — N10 Acute tubulo-interstitial nephritis: Secondary | ICD-10-CM | POA: Diagnosis not present

## 2020-02-28 DIAGNOSIS — S1083XA Contusion of other specified part of neck, initial encounter: Secondary | ICD-10-CM | POA: Diagnosis not present

## 2020-02-28 DIAGNOSIS — G9340 Encephalopathy, unspecified: Secondary | ICD-10-CM | POA: Diagnosis not present

## 2020-02-29 DIAGNOSIS — G9340 Encephalopathy, unspecified: Secondary | ICD-10-CM | POA: Diagnosis not present

## 2020-02-29 DIAGNOSIS — S065X0A Traumatic subdural hemorrhage without loss of consciousness, initial encounter: Secondary | ICD-10-CM | POA: Diagnosis not present

## 2020-02-29 DIAGNOSIS — I6200 Nontraumatic subdural hemorrhage, unspecified: Secondary | ICD-10-CM | POA: Diagnosis not present

## 2020-02-29 DIAGNOSIS — J9600 Acute respiratory failure, unspecified whether with hypoxia or hypercapnia: Secondary | ICD-10-CM | POA: Diagnosis not present

## 2020-02-29 DIAGNOSIS — S065X9A Traumatic subdural hemorrhage with loss of consciousness of unspecified duration, initial encounter: Secondary | ICD-10-CM | POA: Diagnosis not present

## 2020-02-29 DIAGNOSIS — N179 Acute kidney failure, unspecified: Secondary | ICD-10-CM | POA: Diagnosis not present

## 2020-03-01 DIAGNOSIS — S065X9A Traumatic subdural hemorrhage with loss of consciousness of unspecified duration, initial encounter: Secondary | ICD-10-CM | POA: Diagnosis not present

## 2020-03-01 DIAGNOSIS — G9340 Encephalopathy, unspecified: Secondary | ICD-10-CM | POA: Diagnosis not present

## 2020-03-01 DIAGNOSIS — S065X0A Traumatic subdural hemorrhage without loss of consciousness, initial encounter: Secondary | ICD-10-CM | POA: Diagnosis not present

## 2020-03-01 DIAGNOSIS — I808 Phlebitis and thrombophlebitis of other sites: Secondary | ICD-10-CM | POA: Diagnosis not present

## 2020-03-01 DIAGNOSIS — Z4659 Encounter for fitting and adjustment of other gastrointestinal appliance and device: Secondary | ICD-10-CM | POA: Diagnosis not present

## 2020-03-01 DIAGNOSIS — N179 Acute kidney failure, unspecified: Secondary | ICD-10-CM | POA: Diagnosis not present

## 2020-03-01 DIAGNOSIS — J9600 Acute respiratory failure, unspecified whether with hypoxia or hypercapnia: Secondary | ICD-10-CM | POA: Diagnosis not present

## 2020-03-01 DIAGNOSIS — E870 Hyperosmolality and hypernatremia: Secondary | ICD-10-CM | POA: Diagnosis not present

## 2020-03-02 DIAGNOSIS — S065X0A Traumatic subdural hemorrhage without loss of consciousness, initial encounter: Secondary | ICD-10-CM | POA: Diagnosis not present

## 2020-03-02 DIAGNOSIS — E870 Hyperosmolality and hypernatremia: Secondary | ICD-10-CM | POA: Diagnosis not present

## 2020-03-02 DIAGNOSIS — G9341 Metabolic encephalopathy: Secondary | ICD-10-CM | POA: Diagnosis not present

## 2020-03-02 DIAGNOSIS — J9601 Acute respiratory failure with hypoxia: Secondary | ICD-10-CM | POA: Diagnosis not present

## 2020-03-02 DIAGNOSIS — G9340 Encephalopathy, unspecified: Secondary | ICD-10-CM | POA: Diagnosis not present

## 2020-03-02 DIAGNOSIS — A419 Sepsis, unspecified organism: Secondary | ICD-10-CM | POA: Diagnosis not present

## 2020-03-02 DIAGNOSIS — S065X9A Traumatic subdural hemorrhage with loss of consciousness of unspecified duration, initial encounter: Secondary | ICD-10-CM | POA: Diagnosis not present

## 2020-03-02 DIAGNOSIS — N179 Acute kidney failure, unspecified: Secondary | ICD-10-CM | POA: Diagnosis not present

## 2020-03-02 DIAGNOSIS — J9600 Acute respiratory failure, unspecified whether with hypoxia or hypercapnia: Secondary | ICD-10-CM | POA: Diagnosis not present

## 2020-03-03 DIAGNOSIS — S065X9A Traumatic subdural hemorrhage with loss of consciousness of unspecified duration, initial encounter: Secondary | ICD-10-CM | POA: Diagnosis not present

## 2020-03-03 DIAGNOSIS — G9341 Metabolic encephalopathy: Secondary | ICD-10-CM | POA: Diagnosis not present

## 2020-03-03 DIAGNOSIS — J9601 Acute respiratory failure with hypoxia: Secondary | ICD-10-CM | POA: Diagnosis not present

## 2020-03-03 DIAGNOSIS — A419 Sepsis, unspecified organism: Secondary | ICD-10-CM | POA: Diagnosis not present

## 2020-03-04 DIAGNOSIS — A419 Sepsis, unspecified organism: Secondary | ICD-10-CM | POA: Diagnosis not present

## 2020-03-04 DIAGNOSIS — G9340 Encephalopathy, unspecified: Secondary | ICD-10-CM | POA: Diagnosis not present

## 2020-03-04 DIAGNOSIS — J9601 Acute respiratory failure with hypoxia: Secondary | ICD-10-CM | POA: Diagnosis not present

## 2020-03-04 DIAGNOSIS — S065X0A Traumatic subdural hemorrhage without loss of consciousness, initial encounter: Secondary | ICD-10-CM | POA: Diagnosis not present

## 2020-03-05 DIAGNOSIS — G9340 Encephalopathy, unspecified: Secondary | ICD-10-CM | POA: Diagnosis not present

## 2020-03-05 DIAGNOSIS — S065X0A Traumatic subdural hemorrhage without loss of consciousness, initial encounter: Secondary | ICD-10-CM | POA: Diagnosis not present

## 2020-03-05 DIAGNOSIS — A419 Sepsis, unspecified organism: Secondary | ICD-10-CM | POA: Diagnosis not present

## 2020-03-05 DIAGNOSIS — J9601 Acute respiratory failure with hypoxia: Secondary | ICD-10-CM | POA: Diagnosis not present

## 2020-03-06 DIAGNOSIS — G9340 Encephalopathy, unspecified: Secondary | ICD-10-CM | POA: Diagnosis not present

## 2020-03-06 DIAGNOSIS — F0390 Unspecified dementia without behavioral disturbance: Secondary | ICD-10-CM | POA: Diagnosis not present

## 2020-03-06 DIAGNOSIS — S065X0A Traumatic subdural hemorrhage without loss of consciousness, initial encounter: Secondary | ICD-10-CM | POA: Diagnosis not present

## 2020-03-06 DIAGNOSIS — S1093XA Contusion of unspecified part of neck, initial encounter: Secondary | ICD-10-CM | POA: Diagnosis not present

## 2020-03-07 DIAGNOSIS — S065X9A Traumatic subdural hemorrhage with loss of consciousness of unspecified duration, initial encounter: Secondary | ICD-10-CM | POA: Diagnosis not present

## 2020-03-07 DIAGNOSIS — D649 Anemia, unspecified: Secondary | ICD-10-CM | POA: Diagnosis not present

## 2020-03-07 DIAGNOSIS — R0902 Hypoxemia: Secondary | ICD-10-CM | POA: Diagnosis not present

## 2020-03-07 DIAGNOSIS — E43 Unspecified severe protein-calorie malnutrition: Secondary | ICD-10-CM | POA: Insufficient documentation

## 2020-03-07 DIAGNOSIS — J9601 Acute respiratory failure with hypoxia: Secondary | ICD-10-CM | POA: Diagnosis not present

## 2020-03-07 DIAGNOSIS — I10 Essential (primary) hypertension: Secondary | ICD-10-CM | POA: Diagnosis not present

## 2020-03-08 DIAGNOSIS — R9431 Abnormal electrocardiogram [ECG] [EKG]: Secondary | ICD-10-CM | POA: Diagnosis not present

## 2020-03-08 DIAGNOSIS — S065X9D Traumatic subdural hemorrhage with loss of consciousness of unspecified duration, subsequent encounter: Secondary | ICD-10-CM | POA: Diagnosis not present

## 2020-03-09 DIAGNOSIS — J9601 Acute respiratory failure with hypoxia: Secondary | ICD-10-CM | POA: Diagnosis not present

## 2020-03-09 DIAGNOSIS — G9341 Metabolic encephalopathy: Secondary | ICD-10-CM | POA: Diagnosis not present

## 2020-03-09 DIAGNOSIS — J189 Pneumonia, unspecified organism: Secondary | ICD-10-CM | POA: Diagnosis not present

## 2020-03-09 DIAGNOSIS — S065X9A Traumatic subdural hemorrhage with loss of consciousness of unspecified duration, initial encounter: Secondary | ICD-10-CM | POA: Diagnosis not present

## 2020-03-10 DIAGNOSIS — J9601 Acute respiratory failure with hypoxia: Secondary | ICD-10-CM | POA: Diagnosis not present

## 2020-03-10 DIAGNOSIS — S065X9A Traumatic subdural hemorrhage with loss of consciousness of unspecified duration, initial encounter: Secondary | ICD-10-CM | POA: Diagnosis not present

## 2020-03-10 DIAGNOSIS — R1312 Dysphagia, oropharyngeal phase: Secondary | ICD-10-CM | POA: Diagnosis not present

## 2020-03-10 DIAGNOSIS — G9341 Metabolic encephalopathy: Secondary | ICD-10-CM | POA: Diagnosis not present

## 2020-03-10 DIAGNOSIS — J189 Pneumonia, unspecified organism: Secondary | ICD-10-CM | POA: Diagnosis not present

## 2020-03-10 DIAGNOSIS — R633 Feeding difficulties, unspecified: Secondary | ICD-10-CM | POA: Diagnosis not present

## 2020-03-11 DIAGNOSIS — J9601 Acute respiratory failure with hypoxia: Secondary | ICD-10-CM | POA: Diagnosis not present

## 2020-03-11 DIAGNOSIS — J690 Pneumonitis due to inhalation of food and vomit: Secondary | ICD-10-CM | POA: Diagnosis not present

## 2020-03-11 DIAGNOSIS — S065X9A Traumatic subdural hemorrhage with loss of consciousness of unspecified duration, initial encounter: Secondary | ICD-10-CM | POA: Diagnosis not present

## 2020-03-11 DIAGNOSIS — R569 Unspecified convulsions: Secondary | ICD-10-CM | POA: Diagnosis not present

## 2020-03-12 DIAGNOSIS — J9601 Acute respiratory failure with hypoxia: Secondary | ICD-10-CM | POA: Diagnosis not present

## 2020-03-12 DIAGNOSIS — S065X9A Traumatic subdural hemorrhage with loss of consciousness of unspecified duration, initial encounter: Secondary | ICD-10-CM | POA: Diagnosis not present

## 2020-03-12 DIAGNOSIS — J690 Pneumonitis due to inhalation of food and vomit: Secondary | ICD-10-CM | POA: Diagnosis not present

## 2020-03-12 DIAGNOSIS — R569 Unspecified convulsions: Secondary | ICD-10-CM | POA: Diagnosis not present

## 2020-03-13 DIAGNOSIS — J690 Pneumonitis due to inhalation of food and vomit: Secondary | ICD-10-CM | POA: Diagnosis not present

## 2020-03-13 DIAGNOSIS — J9601 Acute respiratory failure with hypoxia: Secondary | ICD-10-CM | POA: Diagnosis not present

## 2020-03-13 DIAGNOSIS — S065X9A Traumatic subdural hemorrhage with loss of consciousness of unspecified duration, initial encounter: Secondary | ICD-10-CM | POA: Diagnosis not present

## 2020-03-13 DIAGNOSIS — R569 Unspecified convulsions: Secondary | ICD-10-CM | POA: Diagnosis not present

## 2020-03-14 DIAGNOSIS — S065X9A Traumatic subdural hemorrhage with loss of consciousness of unspecified duration, initial encounter: Secondary | ICD-10-CM | POA: Diagnosis not present

## 2020-03-14 DIAGNOSIS — I10 Essential (primary) hypertension: Secondary | ICD-10-CM | POA: Diagnosis not present

## 2020-03-14 DIAGNOSIS — D649 Anemia, unspecified: Secondary | ICD-10-CM | POA: Diagnosis not present

## 2020-03-14 DIAGNOSIS — R569 Unspecified convulsions: Secondary | ICD-10-CM | POA: Diagnosis not present

## 2020-03-15 DIAGNOSIS — I10 Essential (primary) hypertension: Secondary | ICD-10-CM | POA: Diagnosis not present

## 2020-03-15 DIAGNOSIS — D649 Anemia, unspecified: Secondary | ICD-10-CM | POA: Diagnosis not present

## 2020-03-15 DIAGNOSIS — S065X9A Traumatic subdural hemorrhage with loss of consciousness of unspecified duration, initial encounter: Secondary | ICD-10-CM | POA: Diagnosis not present

## 2020-03-15 DIAGNOSIS — R569 Unspecified convulsions: Secondary | ICD-10-CM | POA: Diagnosis not present

## 2020-03-16 DIAGNOSIS — R279 Unspecified lack of coordination: Secondary | ICD-10-CM | POA: Diagnosis not present

## 2020-03-16 DIAGNOSIS — R531 Weakness: Secondary | ICD-10-CM | POA: Diagnosis not present

## 2020-03-16 DIAGNOSIS — D649 Anemia, unspecified: Secondary | ICD-10-CM | POA: Diagnosis not present

## 2020-03-16 DIAGNOSIS — Z743 Need for continuous supervision: Secondary | ICD-10-CM | POA: Diagnosis not present

## 2020-03-16 DIAGNOSIS — R569 Unspecified convulsions: Secondary | ICD-10-CM | POA: Diagnosis not present

## 2020-03-16 DIAGNOSIS — S065X9A Traumatic subdural hemorrhage with loss of consciousness of unspecified duration, initial encounter: Secondary | ICD-10-CM | POA: Diagnosis not present

## 2020-03-16 DIAGNOSIS — I10 Essential (primary) hypertension: Secondary | ICD-10-CM | POA: Diagnosis not present

## 2020-03-17 ENCOUNTER — Other Ambulatory Visit: Payer: Self-pay | Admitting: Family Medicine

## 2020-03-17 DIAGNOSIS — D649 Anemia, unspecified: Secondary | ICD-10-CM | POA: Diagnosis not present

## 2020-03-17 DIAGNOSIS — F319 Bipolar disorder, unspecified: Secondary | ICD-10-CM | POA: Diagnosis not present

## 2020-03-17 DIAGNOSIS — E43 Unspecified severe protein-calorie malnutrition: Secondary | ICD-10-CM | POA: Diagnosis not present

## 2020-03-17 DIAGNOSIS — M5136 Other intervertebral disc degeneration, lumbar region: Secondary | ICD-10-CM

## 2020-03-17 DIAGNOSIS — F418 Other specified anxiety disorders: Secondary | ICD-10-CM | POA: Diagnosis not present

## 2020-03-17 DIAGNOSIS — F419 Anxiety disorder, unspecified: Secondary | ICD-10-CM

## 2020-03-19 DIAGNOSIS — R1310 Dysphagia, unspecified: Secondary | ICD-10-CM | POA: Diagnosis not present

## 2020-03-19 DIAGNOSIS — G629 Polyneuropathy, unspecified: Secondary | ICD-10-CM | POA: Diagnosis not present

## 2020-03-19 DIAGNOSIS — S065X0A Traumatic subdural hemorrhage without loss of consciousness, initial encounter: Secondary | ICD-10-CM | POA: Diagnosis not present

## 2020-03-19 DIAGNOSIS — E43 Unspecified severe protein-calorie malnutrition: Secondary | ICD-10-CM | POA: Diagnosis not present

## 2020-03-20 DIAGNOSIS — L608 Other nail disorders: Secondary | ICD-10-CM | POA: Diagnosis not present

## 2020-03-20 DIAGNOSIS — F319 Bipolar disorder, unspecified: Secondary | ICD-10-CM | POA: Diagnosis not present

## 2020-03-20 DIAGNOSIS — D649 Anemia, unspecified: Secondary | ICD-10-CM | POA: Diagnosis not present

## 2020-03-20 DIAGNOSIS — E559 Vitamin D deficiency, unspecified: Secondary | ICD-10-CM | POA: Diagnosis not present

## 2020-03-22 DIAGNOSIS — R1310 Dysphagia, unspecified: Secondary | ICD-10-CM | POA: Diagnosis not present

## 2020-03-22 DIAGNOSIS — L608 Other nail disorders: Secondary | ICD-10-CM | POA: Diagnosis not present

## 2020-03-22 DIAGNOSIS — E559 Vitamin D deficiency, unspecified: Secondary | ICD-10-CM | POA: Diagnosis not present

## 2020-03-22 DIAGNOSIS — D649 Anemia, unspecified: Secondary | ICD-10-CM | POA: Diagnosis not present

## 2020-03-23 ENCOUNTER — Ambulatory Visit: Payer: Medicare Other | Admitting: Gastroenterology

## 2020-03-27 DIAGNOSIS — G894 Chronic pain syndrome: Secondary | ICD-10-CM | POA: Diagnosis not present

## 2020-03-29 ENCOUNTER — Ambulatory Visit: Payer: Medicare Other | Admitting: Family Medicine

## 2020-03-29 ENCOUNTER — Other Ambulatory Visit: Payer: Self-pay | Admitting: Family Medicine

## 2020-03-29 DIAGNOSIS — F331 Major depressive disorder, recurrent, moderate: Secondary | ICD-10-CM

## 2020-03-29 DIAGNOSIS — R2689 Other abnormalities of gait and mobility: Secondary | ICD-10-CM | POA: Diagnosis not present

## 2020-03-29 DIAGNOSIS — E43 Unspecified severe protein-calorie malnutrition: Secondary | ICD-10-CM | POA: Diagnosis not present

## 2020-03-29 DIAGNOSIS — M6281 Muscle weakness (generalized): Secondary | ICD-10-CM | POA: Diagnosis not present

## 2020-03-29 DIAGNOSIS — L89152 Pressure ulcer of sacral region, stage 2: Secondary | ICD-10-CM | POA: Diagnosis not present

## 2020-03-29 DIAGNOSIS — M5136 Other intervertebral disc degeneration, lumbar region: Secondary | ICD-10-CM

## 2020-03-30 DIAGNOSIS — D649 Anemia, unspecified: Secondary | ICD-10-CM | POA: Diagnosis not present

## 2020-03-30 DIAGNOSIS — F319 Bipolar disorder, unspecified: Secondary | ICD-10-CM | POA: Diagnosis not present

## 2020-03-30 DIAGNOSIS — E43 Unspecified severe protein-calorie malnutrition: Secondary | ICD-10-CM | POA: Diagnosis not present

## 2020-03-31 ENCOUNTER — Other Ambulatory Visit: Payer: Self-pay | Admitting: Family Medicine

## 2020-03-31 DIAGNOSIS — F331 Major depressive disorder, recurrent, moderate: Secondary | ICD-10-CM

## 2020-04-04 DIAGNOSIS — F411 Generalized anxiety disorder: Secondary | ICD-10-CM | POA: Diagnosis not present

## 2020-04-04 DIAGNOSIS — F330 Major depressive disorder, recurrent, mild: Secondary | ICD-10-CM | POA: Diagnosis not present

## 2020-04-06 DIAGNOSIS — F445 Conversion disorder with seizures or convulsions: Secondary | ICD-10-CM | POA: Diagnosis not present

## 2020-04-06 DIAGNOSIS — F5101 Primary insomnia: Secondary | ICD-10-CM | POA: Diagnosis not present

## 2020-04-06 DIAGNOSIS — F321 Major depressive disorder, single episode, moderate: Secondary | ICD-10-CM | POA: Diagnosis not present

## 2020-04-17 ENCOUNTER — Other Ambulatory Visit: Payer: Self-pay | Admitting: Family Medicine

## 2020-04-17 DIAGNOSIS — F331 Major depressive disorder, recurrent, moderate: Secondary | ICD-10-CM

## 2020-04-22 ENCOUNTER — Other Ambulatory Visit: Payer: Self-pay | Admitting: Family Medicine

## 2020-04-22 DIAGNOSIS — M5136 Other intervertebral disc degeneration, lumbar region: Secondary | ICD-10-CM

## 2020-04-24 ENCOUNTER — Ambulatory Visit: Payer: Medicare Other | Admitting: Family Medicine

## 2020-04-25 ENCOUNTER — Other Ambulatory Visit: Payer: Self-pay | Admitting: Family Medicine

## 2020-05-12 ENCOUNTER — Other Ambulatory Visit: Payer: Self-pay | Admitting: Family Medicine

## 2020-05-12 DIAGNOSIS — F331 Major depressive disorder, recurrent, moderate: Secondary | ICD-10-CM

## 2020-05-12 DIAGNOSIS — F419 Anxiety disorder, unspecified: Secondary | ICD-10-CM

## 2020-05-12 DIAGNOSIS — M5136 Other intervertebral disc degeneration, lumbar region: Secondary | ICD-10-CM

## 2020-05-26 ENCOUNTER — Other Ambulatory Visit: Payer: Self-pay | Admitting: Family Medicine

## 2020-05-26 DIAGNOSIS — M5136 Other intervertebral disc degeneration, lumbar region: Secondary | ICD-10-CM

## 2020-06-06 ENCOUNTER — Telehealth: Payer: Self-pay

## 2020-06-06 ENCOUNTER — Ambulatory Visit (INDEPENDENT_AMBULATORY_CARE_PROVIDER_SITE_OTHER): Payer: Medicare Other | Admitting: Family Medicine

## 2020-06-06 ENCOUNTER — Other Ambulatory Visit: Payer: Self-pay

## 2020-06-06 VITALS — BP 106/70 | HR 71 | Temp 97.2°F | Ht 65.0 in | Wt 100.0 lb

## 2020-06-06 DIAGNOSIS — F1020 Alcohol dependence, uncomplicated: Secondary | ICD-10-CM | POA: Diagnosis not present

## 2020-06-06 DIAGNOSIS — F319 Bipolar disorder, unspecified: Secondary | ICD-10-CM

## 2020-06-06 DIAGNOSIS — Z8679 Personal history of other diseases of the circulatory system: Secondary | ICD-10-CM

## 2020-06-06 DIAGNOSIS — F331 Major depressive disorder, recurrent, moderate: Secondary | ICD-10-CM

## 2020-06-06 DIAGNOSIS — E440 Moderate protein-calorie malnutrition: Secondary | ICD-10-CM

## 2020-06-06 DIAGNOSIS — M5136 Other intervertebral disc degeneration, lumbar region: Secondary | ICD-10-CM

## 2020-06-06 DIAGNOSIS — R630 Anorexia: Secondary | ICD-10-CM | POA: Diagnosis not present

## 2020-06-06 DIAGNOSIS — F1027 Alcohol dependence with alcohol-induced persisting dementia: Secondary | ICD-10-CM

## 2020-06-06 MED ORDER — ENSURE COMPLETE PO LIQD: 237.0000 mL | Freq: Two times a day (BID) | ORAL | 99 refills | Status: DC

## 2020-06-06 MED ORDER — FOLIC ACID 1 MG PO TABS: 1.0000 mg | ORAL_TABLET | Freq: Every day | ORAL | 3 refills | Status: DC

## 2020-06-06 MED ORDER — FLUOXETINE HCL 40 MG PO CAPS
40.0000 mg | ORAL_CAPSULE | Freq: Every day | ORAL | 3 refills | Status: DC
Start: 2020-06-06 — End: 2021-05-04

## 2020-06-06 MED ORDER — GABAPENTIN 600 MG PO TABS
600.0000 mg | ORAL_TABLET | Freq: Every day | ORAL | 3 refills | Status: DC
Start: 2020-06-06 — End: 2021-04-20

## 2020-06-06 MED ORDER — QUETIAPINE FUMARATE 25 MG PO TABS: 25.0000 mg | ORAL_TABLET | Freq: Every day | ORAL | 0 refills | Status: DC

## 2020-06-06 MED ORDER — TIZANIDINE HCL 2 MG PO TABS: 1.0000 mg | ORAL_TABLET | Freq: Three times a day (TID) | ORAL | 1 refills | Status: DC | PRN

## 2020-06-06 NOTE — Patient Instructions (Signed)
You had labs performed today.  You will be contacted with the results of the labs once they are available, usually in the next 3 business days for routine lab work.  If you have an active my chart account, they will be released to your MyChart.  If you prefer to have these labs released to you via telephone, please let us know.  If you had a pap smear or biopsy performed, expect to be contacted in about 7-10 days.  Fluoxetine increased to 40mg  daily. Will call about Seroquel.  If you can figure out the dose from her pill pack, call me and let me know  Ensure prescription sent.  Your mom is underweight.  Let me know about the pain referral

## 2020-06-06 NOTE — Telephone Encounter (Signed)
FY: Called to let Dr Nadine Counts know that the Seroquel Rx is 25 mg

## 2020-06-06 NOTE — Progress Notes (Signed)
Subjective: CC: Nursing Home follow up PCP: Janora Norlander, DO ONG:EXBMWU G Betty Nelson is a 60 y.o. female presenting to clinic today for:  1. Subdural hematoma/ sepsis Patient was admitted to an outside hospital initially for sepsis and then later for subdural hematoma.  Her daughter reports to me that they found her on the floor and they suspect that she had hit her head on one of the furniture pieces.  No surgical evacuation of the hematoma is required but she was in the neuro ICU for a while before transitioning over to a nursing facility in [place].  She is now living at home.  Her daughter is providing 24-hour 7 days a week care.  They have not followed up with neurology since discharge from the nursing home.  She does report ongoing anxiety and depression and would like to see if they can advance to fluoxetine 40 mg daily.  She was apparently started on Seroquel as well but they are not sure of the dose.  She took her last dose yesterday.  Her daughter feels that her sleep is better with the Seroquel but notes that they never did discontinue the trazodone despite this being recommended by the hospital system.  She apparently was treated with liquid oxycodone as needed and the patient asked for renewal of this but the daughter does not feel that she actually needs it.  She does ask for refills of gabapentin and muscle relaxer however.  She continues to ambulate with a cane.  Continues to have poor appetite.  She is to drink 2 beers since coming home.  She is reduced her smoking as well.  Continues to smoke half a pack per day however.   ROS: Per HPI  No Known Allergies Past Medical History:  Diagnosis Date  . Anxiety   . Bipolar disorder ([place])   . Chronic lower back pain   . DDD (degenerative disc disease), lumbar   . Depression   . Disability of walking   . Headache    "about q wk" (10/09/2017)  . Hypertension   . Seizure ([place]) 10/05/2017   "that was my 1st" (10/09/2017)  .  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  fexofenadine (ALLEGRA ALLERGY) 180 MG tablet, Take 1 tablet (180 mg total) by mouth daily., Disp: 90 tablet, Rfl: 2 .  FLUoxetine (PROZAC) 20 MG capsule, Take 1 capsule by mouth once daily, Disp: 30 capsule, Rfl: 0 .  fluticasone (FLONASE) 50 MCG/ACT nasal spray, Place 2 sprays into both nostrils daily., Disp: 16 g, Rfl: 6 .  gabapentin (NEURONTIN) 600 MG tablet, Take 1 tablet (600 mg total) by mouth at bedtime., Disp: 30 tablet, Rfl: 0 .  hydrOXYzine (ATARAX/VISTARIL) 25 MG tablet, TAKE 1 TABLET (25 MG TOTAL) BY MOUTH EVERY 8 (EIGHT) HOURS AS NEEDED. FOR ANXIETY, Disp: 90 tablet, Rfl: 0 .  levETIRAcetam (KEPPRA) 500 MG tablet, Take 1 tablet (500 mg total) by mouth 2 (two) times daily., Disp: 120 tablet, Rfl: 11 .  meloxicam (MOBIC) 7.5 MG tablet, TAKE 1 TABLET (7.5 MG TOTAL) BY MOUTH DAILY. ARTHRITIS, Disp: 30 tablet, Rfl: 0 .  metoprolol tartrate (LOPRESSOR) 25 MG tablet, TAKE 1 TABLET BY MOUTH TWICE A DAY, Disp: 60 tablet, Rfl: 3 .  omeprazole (PRILOSEC) 20 MG capsule, Take 1 capsule (20 mg total) by mouth daily. heartburn, Disp: 90 capsule, Rfl: 0 .  tiZANidine (ZANAFLEX) 2 MG tablet, TAKE 1/2-1 TABLET EVERY 8 HOURS AS NEEDED FOR MUSCLE SPASMS (  USE SPARINGLY, CAUSES SEDATION/ FALLS)., Disp: 60 tablet, Rfl: 1 .  traZODone (DESYREL) 150 MG tablet, TAKE 1/2 TO 1 TABLET AT BEDTIME AS NEEDED FOR SLEEP, Disp: 30 tablet, Rfl: 12 Social History   Socioeconomic History  . Marital status: Married    Spouse name: Not on file  . Number of children: Not on file  . Years of education: Not on file  . Highest education level: Not on file  Occupational History  . Not on file  Tobacco Use  . Smoking status: Current Every Day Smoker    Packs/day: 0.33    Years: 43.00    Pack years: 14.19    Types: Cigarettes  . Smokeless tobacco: Former Systems developer    Types: Snuff  Vaping Use  . Vaping Use: Never used  Substance and Sexual Activity   . Alcohol use: Yes    Alcohol/week: 2.0 standard drinks    Types: 2 Cans of beer per week    Comment: 10/09/2017 "~ 2 beers/wk"  . Drug use: No  . Sexual activity: Not Currently  Other Topics Concern  . Not on file  Social History Narrative  . Not on file   Social Determinants of Health   Financial Resource Strain: Low Risk   . Difficulty of Paying Living Expenses: Not hard at all  Food Insecurity: No Food Insecurity  . Worried About Charity fundraiser in the Last Year: Never true  . Ran Out of Food in the Last Year: Never true  Transportation Needs: No Transportation Needs  . Lack of Transportation (Medical): No  . Lack of Transportation (Non-Medical): No  Physical Activity: Inactive  . Days of Exercise per Week: 0 days  . Minutes of Exercise per Session: 0 min  Stress: Stress Concern Present  . Feeling of Stress : To some extent  Social Connections: Moderately Isolated  . Frequency of Communication with Friends and Family: More than three times a week  . Frequency of Social Gatherings with Friends and Family: More than three times a week  . Attends Religious Services: Never  . Active Member of Clubs or Organizations: No  . Attends Archivist Meetings: Never  . Marital Status: Married  Human resources officer Violence: Not At Risk  . Fear of Current or Ex-Partner: No  . Emotionally Abused: No  . Physically Abused: No  . Sexually Abused: No   Family History  Problem Relation Age of Onset  . Heart disease Mother   . Hyperlipidemia Mother   . Heart disease Father   . Hyperlipidemia Father     Objective: Office vital signs reviewed. BP 106/70   Pulse 71   Temp (!) 97.2 F (36.2 C) (Temporal)   Ht '5\' 5"'  (1.651 m)   Wt 100 lb (45.4 kg)   SpO2 99%   BMI 16.64 kg/m   Physical Examination:  General: Awake, alert, malnourished, No acute distress. Smells of tobacco, disheveled appearing HEENT: Normal, sclera anicteric Cardio: regular rate and rhythm, S1S2  heard, no murmurs appreciated Pulm: Globally decreased breath sounds but normal work of breathing on room air MSK:  Slow, shuffled gait and normal station; ambulating with use of cane.  She has decreased tone Neuro: Hard of hearing Psych: Mood is stable.  Patient is pleasant.  She does not appear to be responding to internal stimuli but does stare off into space sometimes  Assessment/ Plan: 60 y.o. 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  We will advance fluoxetine to 40 mg.  Discontinue trazodone.  There was a concern that this may be contributing to her fall.  She may continue the Seroquel but we are not sure what dose she is on so we will contact her nursing facility and prescribe accordingly  She continues to drink alcohol but to a much lesser degree than previous per her daughter's report.  Her daughter is giving her 24/7 care.  Check CMP.  Folic acid renewed  Ensure provided given malnutrition.  This has been sent as a prescription  Check Keppra level.  Advised to follow-up with neurology given recent subdural hematoma.  Gabapentin and tizanidine renewed for back pain but will recommend referral to pain management if she is needing an opioid.  She has had abnormal urine drug screens here in office which unfortunately ties my hands at prescribing further narcotics for this patient.  She is not under hospice care  at this time.    No orders of the defined types were placed in this encounter.  No orders of the defined types were placed in this encounter.  Janora Norlander, DO [place] [phone]

## 2020-06-07 ENCOUNTER — Other Ambulatory Visit: Payer: Self-pay | Admitting: Family Medicine

## 2020-06-07 DIAGNOSIS — F419 Anxiety disorder, unspecified: Secondary | ICD-10-CM

## 2020-06-08 LAB — CBC
Hematocrit: 37.3 % (ref 34.0–46.6)
Hemoglobin: 12.2 g/dL (ref 11.1–15.9)
MCH: 27.1 pg (ref 26.6–33.0)
MCHC: 32.7 g/dL (ref 31.5–35.7)
MCV: 83 fL (ref 79–97)
Platelets: 273 10*3/uL (ref 150–450)
RBC: 4.51 x10E6/uL (ref 3.77–5.28)
RDW: 17.1 % — ABNORMAL HIGH (ref 11.7–15.4)
WBC: 5.9 10*3/uL (ref 3.4–10.8)

## 2020-06-08 LAB — CMP14+EGFR
ALT: 69 IU/L — ABNORMAL HIGH (ref 0–32)
AST: 95 IU/L — ABNORMAL HIGH (ref 0–40)
Albumin/Globulin Ratio: 1.6 (ref 1.2–2.2)
Albumin: 4.4 g/dL (ref 3.8–4.8)
Alkaline Phosphatase: 72 IU/L (ref 44–121)
BUN/Creatinine Ratio: 7 — ABNORMAL LOW (ref 12–28)
BUN: 9 mg/dL (ref 8–27)
Bilirubin Total: <0.2 mg/dL (ref 0.0–1.2)
CO2: 16 mmol/L — ABNORMAL LOW (ref 20–29)
Calcium: 9.7 mg/dL (ref 8.7–10.3)
Chloride: 102 mmol/L (ref 96–106)
Creatinine, Ser: 1.32 mg/dL — ABNORMAL HIGH (ref 0.57–1.00)
Globulin, Total: 2.8 g/dL (ref 1.5–4.5)
Glucose: 108 mg/dL — ABNORMAL HIGH (ref 65–99)
Potassium: 4.2 mmol/L (ref 3.5–5.2)
Sodium: 138 mmol/L (ref 134–144)
Total Protein: 7.2 g/dL (ref 6.0–8.5)
eGFR: 46 mL/min/{1.73_m2} — ABNORMAL LOW (ref >59)

## 2020-06-08 LAB — LEVETIRACETAM LEVEL: Levetiracetam Lvl: 39.4 ug/mL (ref 10.0–40.0)

## 2020-06-12 NOTE — Progress Notes (Signed)
Good level for this patient - THANK You!

## 2020-06-25 ENCOUNTER — Other Ambulatory Visit: Payer: Self-pay | Admitting: Family Medicine

## 2020-06-25 DIAGNOSIS — M5136 Other intervertebral disc degeneration, lumbar region: Secondary | ICD-10-CM

## 2020-06-26 ENCOUNTER — Encounter: Payer: Self-pay | Admitting: *Deleted

## 2020-07-18 ENCOUNTER — Other Ambulatory Visit: Payer: Self-pay

## 2020-07-18 ENCOUNTER — Encounter: Payer: Self-pay | Admitting: Family Medicine

## 2020-07-18 ENCOUNTER — Ambulatory Visit (INDEPENDENT_AMBULATORY_CARE_PROVIDER_SITE_OTHER): Payer: Medicare Other | Admitting: Family Medicine

## 2020-07-18 VITALS — BP 78/48 | HR 77 | Temp 97.7°F | Ht 65.0 in | Wt 101.4 lb

## 2020-07-18 DIAGNOSIS — F319 Bipolar disorder, unspecified: Secondary | ICD-10-CM

## 2020-07-18 DIAGNOSIS — I959 Hypotension, unspecified: Secondary | ICD-10-CM | POA: Diagnosis not present

## 2020-07-18 DIAGNOSIS — F1027 Alcohol dependence with alcohol-induced persisting dementia: Secondary | ICD-10-CM

## 2020-07-18 DIAGNOSIS — F1020 Alcohol dependence, uncomplicated: Secondary | ICD-10-CM | POA: Diagnosis not present

## 2020-07-18 NOTE — Patient Instructions (Signed)
Don't give her tonight's Metoprolol. Only give 1/2 tomorrow  Don't give any Thursday. Monitor BP at home if you can. See [place] back on Thursday for BP check with nurse FLUIDS. Drink a LOT of them.  If she gets more weak or becomes very sleepy/ acting unusual go immediately to ER  Hypotension As your heart beats, it forces blood through your body. This force is called blood pressure. If you have hypotension, you have low blood pressure. When your blood pressure is too low, you may not get enough blood to your brain or other parts of your body. This may cause you to feel weak, light-headed, have a fast heartbeat, or even pass out (faint). Low blood pressure may be harmless, or it may cause serious problems. What are the causes?  Blood loss.  Not enough water in the body (dehydration).  Heart problems.  Hormone problems.  Pregnancy.  A very bad infection.  Not having enough of certain nutrients.  Very bad allergic reactions.  Certain medicines. What increases the risk?  Age. The risk increases as you get older.  Conditions that affect the heart or the brain and spinal cord (central nervous system).  Taking certain medicines.  Being pregnant. What are the signs or symptoms?  Feeling: ? Weak. ? Light-headed. ? Dizzy. ? Tired (fatigued).  Blurred vision.  Fast heartbeat.  Passing out, in very bad cases. How is this treated?  Changing your diet. This may involve eating more salt (sodium) or drinking more water.  Taking medicines to raise your blood pressure.  Changing how much you take (the dosage) of some of your medicines.  Wearing compression stockings. These stockings help to prevent blood clots and reduce swelling in your legs. In some cases, you may need to go to the hospital for:  Fluid replacement. This means you will receive fluids through an IV tube.  Blood replacement. This means you will receive donated blood through an IV tube  (transfusion).  Treating an infection or heart problems, if this applies.  Monitoring. You may need to be monitored while medicines that you are taking wear off. Follow these instructions at home: Eating and drinking  Drink enough fluids to keep your pee (urine) pale yellow.  Eat a healthy diet. Follow instructions from your doctor about what you can eat or drink. A healthy diet includes: ? Fresh fruits and vegetables. ? Whole grains. ? Low-fat (lean) meats. ? Low-fat dairy products.  Eat extra salt only as told. Do not add extra salt to your diet unless your doctor tells you to.  Eat small meals often.  Avoid standing up quickly after you eat.   Medicines  Take over-the-counter and prescription medicines only as told by your doctor. ? Follow instructions from your doctor about changing how much you take of your medicines, if this applies. ? Do not stop or change any of your medicines on your own. General instructions  Wear compression stockings as told by your doctor.  Get up slowly from lying down or sitting.  Avoid hot showers and a lot of heat as told by your doctor.  Return to your normal activities as told by your doctor. Ask what activities are safe for you.  Do not use any products that contain nicotine or tobacco, such as cigarettes, e-cigarettes, and chewing tobacco. If you need help quitting, ask your doctor.  Keep all follow-up visits as told by your doctor. This is important.   Contact a doctor if:  You throw up (  vomit).  You have watery poop (diarrhea).  You have a fever for more than 2-3 days.  You feel more thirsty than normal.  You feel weak and tired. Get help right away if:  You have chest pain.  You have a fast or uneven heartbeat.  You lose feeling (have numbness) in any part of your body.  You cannot move your arms or your legs.  You have trouble talking.  You get sweaty or feel light-headed.  You pass out.  You have trouble  breathing.  You have trouble staying awake.  You feel mixed up (confused). Summary  Hypotension is also called low blood pressure. It is when the force of blood pumping through your arteries is too weak.  Hypotension may be harmless, or it may cause serious problems.  Treatment may include changing your diet and medicines, and wearing compression stockings.  In very bad cases, you may need to go to the hospital. This information is not intended to replace advice given to you by your health care provider. Make sure you discuss any questions you have with your health care provider. Document Revised: 09/18/2017 Document Reviewed: 09/18/2017 Elsevier Patient Education  2021 [employer].

## 2020-07-18 NOTE — Progress Notes (Signed)
Subjective: CC: Follow-up bipolar depression PCP: [name], DO XBD:ZHGDJM G Betty Nelson is a 60 y.o. female presenting to clinic today for:  1.  Bipolar depression Patient's Prozac was increased to 40 mg daily.  She was continued on Seroquel 25 mg at bedtime and her daughter notes she continues to use trazodone at bedtime as well as needed for sleep.  Patient feels that her depression is relatively unchanged from previous.  She continues to consume alcohol typically 2 beers per day.  No liquor consumption.  Her daughter does not report any hallucinations today.  2.  Hypertension Patient is treated with metoprolol 25 mg twice daily and she has been getting this.  Her daughter is who administers her medication.  Unlikely to have had overuse of medication.  She does not eat or drink well.  She tries to get at least half a can of Ensure and her mother daily.  The patient does note that she feels somewhat "weak".  Denies any falls, vertigo, presyncopal episodes.  No tachycardia.   ROS: Per HPI  No Known Allergies Past Medical History:  Diagnosis Date  . Anxiety   . Bipolar disorder (HCC)   . Chronic lower back pain   . DDD (degenerative disc disease), lumbar   . Depression   . Disability of walking   . Headache    "about q wk" (10/09/2017)  . Hypertension   . Seizure (HCC) 10/05/2017   "that was my 1st" (10/09/2017)  . 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  feeding supplement, ENSURE COMPLETE, (ENSURE COMPLETE) LIQD, Take 237 mLs by mouth 2 (two) times daily between meals., Disp: 14220 mL, Rfl: prn .  fexofenadine (ALLEGRA ALLERGY) 180 MG tablet, Take 1 tablet (180 mg total) by mouth daily., Disp: 90 tablet, Rfl: 2 .  FLUoxetine (PROZAC) 40 MG capsule, Take 1 capsule (40 mg total) by mouth daily., Disp: 90 capsule, Rfl: 3 .  folic acid (FOLVITE) 1 MG tablet, Take 1 tablet (1 mg total) by mouth daily., Disp: 90 tablet, Rfl: 3 .  gabapentin  (NEURONTIN) 600 MG tablet, Take 1 tablet (600 mg total) by mouth at bedtime., Disp: 90 tablet, Rfl: 3 .  hydrOXYzine (ATARAX/VISTARIL) 25 MG tablet, TAKE 1 TABLET (25 MG TOTAL) BY MOUTH EVERY 8 (EIGHT) HOURS AS NEEDED. FOR ANXIETY, Disp: 90 tablet, Rfl: 0 .  levETIRAcetam (KEPPRA) 500 MG tablet, Take 1 tablet (500 mg total) by mouth 2 (two) times daily., Disp: 120 tablet, Rfl: 11 .  meloxicam (MOBIC) 7.5 MG tablet, TAKE 1 TABLET (7.5 MG TOTAL) BY MOUTH DAILY. ARTHRITIS, Disp: 30 tablet, Rfl: 2 .  metoprolol tartrate (LOPRESSOR) 25 MG tablet, TAKE 1 TABLET BY MOUTH TWICE A DAY, Disp: 60 tablet, Rfl: 3 .  omeprazole (PRILOSEC) 20 MG capsule, Take 1 capsule (20 mg total) by mouth daily. heartburn, Disp: 90 capsule, Rfl: 0 .  QUEtiapine (SEROQUEL) 25 MG tablet, Take 1 tablet (25 mg total) by mouth at bedtime., Disp: 90 tablet, Rfl: 0 .  tiZANidine (ZANAFLEX) 2 MG tablet, Take 0.5-1 tablets (1-2 mg total) by mouth every 8 (eight) hours as needed for muscle spasms (USE ONLY IF NEEDED. Can cause sleepiness/ falls)., Disp: 60 tablet, Rfl: 1 Social History   Socioeconomic History  . Marital status: Married    Spouse name: Not on file  . Number of children: Not on file  . Years of education: Not on file  . Highest education level: Not on file  Occupational History  . Not on file  Tobacco Use  . Smoking status: Current Every Day Smoker    Packs/day: 0.33    Years: 43.00    Pack years: 14.19    Types: Cigarettes  . Smokeless tobacco: Former Neurosurgeon    Types: Snuff  Vaping Use  . Vaping Use: Never used  Substance and Sexual Activity  . Alcohol use: Yes    Alcohol/week: 2.0 standard drinks    Types: 2 Cans of beer per week    Comment: 10/09/2017 "~ 2 beers/wk"  . Drug use: No  . Sexual activity: Not Currently  Other Topics Concern  . Not on file  Social History Narrative  . Not on file   Social Determinants of Health   Financial Resource Strain: Low Risk   . Difficulty of Paying Living  Expenses: Not hard at all  Food Insecurity: No Food Insecurity  . Worried About Programme researcher, broadcasting/film/video in the Last Year: Never true  . Ran Out of Food in the Last Year: Never true  Transportation Needs: No Transportation Needs  . Lack of Transportation (Medical): No  . Lack of Transportation (Non-Medical): No  Physical Activity: Inactive  . Days of Exercise per Week: 0 days  . Minutes of Exercise per Session: 0 min  Stress: Stress Concern Present  . Feeling of Stress : To some extent  Social Connections: Moderately Isolated  . Frequency of Communication with Friends and Family: More than three times a week  . Frequency of Social Gatherings with Friends and Family: More than three times a week  . Attends Religious Services: Never  . Active Member of Clubs or Organizations: No  . Attends Banker Meetings: Never  . Marital Status: Married  Catering manager Violence: Not At Risk  . Fear of Current or Ex-Partner: No  . Emotionally Abused: No  . Physically Abused: No  . Sexually Abused: No   Family History  Problem Relation Age of Onset  . Heart disease Mother   . Hyperlipidemia Mother   . Heart disease Father   . Hyperlipidemia Father     Objective: Office vital signs reviewed. BP (!) 78/48   Pulse 77   Temp 97.7 F (36.5 C)   Ht 5\' 5"  (1.651 m)   Wt 101 lb 6.4 oz (46 kg)   SpO2 97%   BMI 16.87 kg/m   Physical Examination:  General: Awake, alert, malnourished, No acute distress HEENT: Normal, MMM Cardio: regular rate and rhythm, S1S2 heard, no murmurs appreciated Pulm: clear to auscultation bilaterally, no wheezes, rhonchi or rales; normal work of breathing on room air GI: soft, non-tender, non-distended, bowel sounds present x4  Extremities: warm, well perfused, No edema, cyanosis or clubbing; +2 pulses bilaterally Neuro: Hard of hearing.  Somewhat confused intermittently.  Assessment/ Plan: 60 y.o. 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  She is about 20 points less than what she is normally systolic.  Unsure if this has to do with her alcoholism versus medication induced but I am going to backed down on her metoprolol to half tablet tomorrow and then none on Thursday.  Would like to avoid abrupt discontinuation of her beta-blocker so as not to increase risk of cardiovascular events.  This was verbalized to her daughter and she voiced good understanding.  Push  oral fluids.  Follow-up in 36-48 hours for blood pressure recheck with nurse.  I have also ordered FOBT ensure that this is not an unrealized bleed  Stable with Seroquel, Prozac.  No changes made today.  Again reinforced need to discontinue/wean off of alcohol totally as I think this is causing deleterious effects to her health.  No orders of the defined types were placed in this encounter.  No orders of the defined types were placed in this encounter.    [name], DO [facility] [phone]

## 2020-07-20 ENCOUNTER — Ambulatory Visit: Payer: Medicare Other | Admitting: *Deleted

## 2020-07-20 ENCOUNTER — Other Ambulatory Visit: Payer: Self-pay

## 2020-07-20 DIAGNOSIS — I959 Hypotension, unspecified: Secondary | ICD-10-CM

## 2020-07-20 NOTE — Progress Notes (Signed)
Pt in for a BP check today Reading with our machine 86/51 right arm Then 92/59 left arm

## 2020-07-21 ENCOUNTER — Other Ambulatory Visit: Payer: Self-pay | Admitting: Family Medicine

## 2020-07-21 DIAGNOSIS — M5136 Other intervertebral disc degeneration, lumbar region: Secondary | ICD-10-CM

## 2020-07-21 DIAGNOSIS — F419 Anxiety disorder, unspecified: Secondary | ICD-10-CM

## 2020-07-24 NOTE — Progress Notes (Signed)
Recommend cutting the Metoprolol in half and taking twice daily x2 days.  Then take 1/2 tablet daily x2 days. Then stop.  BP is too low to continue this medication.  Follow up in 1 week for BP recheck.  Also, if I am not here please make sure that covering provider gets information.  Elimelech Houseman M. Nadine Counts, DO [facility]

## 2020-07-24 NOTE — Telephone Encounter (Signed)
Last office visit 07/18/20  Hydroxyzine last refill 06/07/20, #90, no refills  Tizanidine last refill 06/06/20, #60, 1 refill

## 2020-07-25 NOTE — Progress Notes (Signed)
LMTCB in regards to recommendations on BP meds

## 2020-07-25 NOTE — Progress Notes (Signed)
Daughter aware of medication change & to discontinue Metoprolol. Appt made for next Tuesday with nurse to recheck BP

## 2020-07-26 ENCOUNTER — Other Ambulatory Visit: Payer: Self-pay | Admitting: Family Medicine

## 2020-07-26 ENCOUNTER — Telehealth: Payer: Self-pay

## 2020-07-26 DIAGNOSIS — F419 Anxiety disorder, unspecified: Secondary | ICD-10-CM

## 2020-07-26 DIAGNOSIS — M5136 Other intervertebral disc degeneration, lumbar region: Secondary | ICD-10-CM

## 2020-07-26 MED ORDER — HYDROXYZINE HCL 25 MG PO TABS: 25.0000 mg | ORAL_TABLET | Freq: Three times a day (TID) | ORAL | 0 refills | Status: DC | PRN

## 2020-07-26 MED ORDER — TIZANIDINE HCL 2 MG PO TABS: ORAL_TABLET | ORAL | 1 refills | Status: DC

## 2020-07-26 NOTE — Telephone Encounter (Signed)
[facility] faxed form over requesting refill of Tizanidine and Hydroxyzine and they received a reply that we had refilled on 07/24/20.  The pharmacist. Pietro Cassis, said she never received those refills and asked that we re-submit.

## 2020-08-08 ENCOUNTER — Other Ambulatory Visit: Payer: Self-pay

## 2020-08-08 ENCOUNTER — Ambulatory Visit: Payer: Medicare Other

## 2020-08-08 DIAGNOSIS — Z0130 Encounter for examination of blood pressure without abnormal findings: Secondary | ICD-10-CM

## 2020-08-08 NOTE — Progress Notes (Signed)
Patient is here today to have her blood pressure checked.  Patient's reading today was 111/73, pulse 83.

## 2020-08-19 ENCOUNTER — Other Ambulatory Visit: Payer: Self-pay | Admitting: Family Medicine

## 2020-08-19 DIAGNOSIS — F331 Major depressive disorder, recurrent, moderate: Secondary | ICD-10-CM

## 2020-08-21 ENCOUNTER — Other Ambulatory Visit: Payer: Self-pay | Admitting: Neurology

## 2020-08-21 ENCOUNTER — Other Ambulatory Visit: Payer: Self-pay | Admitting: Family Medicine

## 2020-08-21 DIAGNOSIS — F331 Major depressive disorder, recurrent, moderate: Secondary | ICD-10-CM

## 2020-08-28 ENCOUNTER — Other Ambulatory Visit: Payer: Self-pay | Admitting: Family Medicine

## 2020-08-28 DIAGNOSIS — M5136 Other intervertebral disc degeneration, lumbar region: Secondary | ICD-10-CM

## 2020-08-29 ENCOUNTER — Telehealth: Payer: Self-pay | Admitting: Family Medicine

## 2020-08-29 MED ORDER — TIZANIDINE HCL 2 MG PO TABS: ORAL_TABLET | ORAL | 1 refills | Status: DC

## 2020-08-29 NOTE — Telephone Encounter (Signed)
  Prescription Request  08/29/2020  What is the name of the medication or equipment? tizanidine  Have you contacted your pharmacy to request a refill? (if applicable) yes  Which pharmacy would you like this sent to[facility] [place]   Patient notified that their request is being sent to the clinical staff for review and that they should receive a response within 2 business days.

## 2020-08-29 NOTE — Telephone Encounter (Signed)
Talked with daughter, pt is taking & needing to take it TID, she will run out of it on Saturday

## 2020-08-29 NOTE — Telephone Encounter (Signed)
Aware refill sent in to pharmacy 

## 2020-08-29 NOTE — Addendum Note (Signed)
Addended by: Julious Payer D on: 08/29/2020 01:15 PM   Modules accepted: Orders

## 2020-08-31 ENCOUNTER — Other Ambulatory Visit: Payer: Self-pay

## 2020-08-31 NOTE — Telephone Encounter (Signed)
Erroneous encounter

## 2020-09-15 ENCOUNTER — Other Ambulatory Visit: Payer: Self-pay | Admitting: Neurology

## 2020-09-16 ENCOUNTER — Other Ambulatory Visit: Payer: Self-pay | Admitting: Family

## 2020-09-16 ENCOUNTER — Other Ambulatory Visit: Payer: Self-pay | Admitting: Family Medicine

## 2020-09-16 DIAGNOSIS — F419 Anxiety disorder, unspecified: Secondary | ICD-10-CM

## 2020-09-16 DIAGNOSIS — M5136 Other intervertebral disc degeneration, lumbar region: Secondary | ICD-10-CM

## 2020-09-16 DIAGNOSIS — J301 Allergic rhinitis due to pollen: Secondary | ICD-10-CM

## 2020-09-19 ENCOUNTER — Other Ambulatory Visit: Payer: Self-pay | Admitting: Family Medicine

## 2020-09-19 ENCOUNTER — Other Ambulatory Visit: Payer: Self-pay | Admitting: Neurology

## 2020-09-19 DIAGNOSIS — F319 Bipolar disorder, unspecified: Secondary | ICD-10-CM

## 2020-09-21 ENCOUNTER — Other Ambulatory Visit: Payer: Self-pay | Admitting: Neurology

## 2020-09-26 ENCOUNTER — Telehealth: Payer: Self-pay | Admitting: Neurology

## 2020-09-26 ENCOUNTER — Other Ambulatory Visit: Payer: Self-pay | Admitting: Neurology

## 2020-09-26 MED ORDER — LEVETIRACETAM 500 MG PO TABS: 500.0000 mg | ORAL_TABLET | Freq: Two times a day (BID) | ORAL | 1 refills | Status: DC

## 2020-09-26 NOTE — Telephone Encounter (Signed)
Pt is needing as refill on her levETIRAcetam (KEPPRA) 500 MG tablet sent in to the [facility] in [facility]

## 2020-09-26 NOTE — Telephone Encounter (Signed)
Refill sent for the patient.  

## 2020-09-26 NOTE — Telephone Encounter (Signed)
Refill sent for the pt. She must keep the upcoming apt in aug

## 2020-10-05 ENCOUNTER — Other Ambulatory Visit: Payer: Self-pay | Admitting: Family Medicine

## 2020-10-05 DIAGNOSIS — M5136 Other intervertebral disc degeneration, lumbar region: Secondary | ICD-10-CM

## 2020-10-06 NOTE — Telephone Encounter (Signed)
Last office visit 07/18/20 Last refill 08/29/20, #60, 1 refill

## 2020-10-12 ENCOUNTER — Other Ambulatory Visit: Payer: Self-pay | Admitting: Family Medicine

## 2020-10-12 DIAGNOSIS — F419 Anxiety disorder, unspecified: Secondary | ICD-10-CM

## 2020-10-16 ENCOUNTER — Other Ambulatory Visit: Payer: Self-pay | Admitting: Family Medicine

## 2020-10-16 DIAGNOSIS — M5136 Other intervertebral disc degeneration, lumbar region: Secondary | ICD-10-CM

## 2020-10-17 ENCOUNTER — Ambulatory Visit: Payer: Medicare Other | Admitting: Neurology

## 2020-11-19 ENCOUNTER — Other Ambulatory Visit: Payer: Self-pay | Admitting: Family Medicine

## 2020-11-19 DIAGNOSIS — M5136 Other intervertebral disc degeneration, lumbar region: Secondary | ICD-10-CM

## 2020-11-20 ENCOUNTER — Other Ambulatory Visit: Payer: Self-pay | Admitting: Neurology

## 2020-11-20 ENCOUNTER — Other Ambulatory Visit: Payer: Self-pay | Admitting: Family Medicine

## 2020-11-20 DIAGNOSIS — F319 Bipolar disorder, unspecified: Secondary | ICD-10-CM

## 2020-11-20 NOTE — Telephone Encounter (Signed)
[place]. NTBS 30 days given 10/16/20

## 2020-12-05 ENCOUNTER — Ambulatory Visit: Payer: Medicare Other | Admitting: Neurology

## 2020-12-18 ENCOUNTER — Other Ambulatory Visit: Payer: Self-pay | Admitting: Family Medicine

## 2020-12-18 DIAGNOSIS — M5136 Other intervertebral disc degeneration, lumbar region: Secondary | ICD-10-CM

## 2020-12-26 ENCOUNTER — Encounter: Payer: Self-pay | Admitting: Family Medicine

## 2020-12-26 NOTE — Progress Notes (Signed)
 Subjective: CC: Follow-up alcoholism, depression PCP: ,  M, DO HPI:Betty Nelson is a 60 y.o. female presenting to clinic today for:  1.  Alcoholism Ongoing alcoholism.  Her daughter notes that typically no more than 2 beers per day.  She is suddenly nauseated this morning.  Her daughter is unsure if she has been compliant with her PPI.  She continues to struggle getting meals into her.  Patient has complained of feeling more depressed as of late but her daughter has not observed any symptoms or signs at home consistent with that.  She is compliant with Prozac, Atarax as needed and Seroquel.  She also continues to take trazodone as needed sleep  Has not been successful in getting nutritional shakes covered by insurance.  Therefore not using.   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 USE ONLY IF NEED. CAN CAUSE SLEEPINESS/FALLS, Disp: 60 tablet, Rfl: 4   feeding supplement, ENSURE COMPLETE, (ENSURE COMPLETE) LIQD, Take 237 mLs by mouth 2 (two) times daily between meals., Disp: 14220 mL, Rfl: prn   fexofenadine (ALLEGRA) 180 MG tablet, TAKE 1 TABLET BY MOUTH EVERY DAY, Disp: 90 tablet, Rfl: 2   FLUoxetine (PROZAC) 40 MG capsule, Take 1 capsule (40 mg total) by mouth daily., Disp: 90 capsule, Rfl: 3   folic acid (FOLVITE) 1 MG tablet, Take 1 tablet (1 mg total) by mouth daily., Disp: 90 tablet, Rfl: 3   gabapentin (NEURONTIN)  600 MG tablet, Take 1 tablet (600 mg total) by mouth at bedtime., Disp: 90 tablet, Rfl: 3   hydrOXYzine (ATARAX/VISTARIL) 25 MG tablet, TAKE 1 TABLET (25 MG TOTAL) BY MOUTH EVERY 8 (EIGHT) HOURS AS NEEDED. FOR ANXIETY, Disp: 270 tablet, Rfl: 1   levETIRAcetam (KEPPRA) 500 MG tablet, Take 1 tablet (500 mg total) by mouth 2 (two) times daily., Disp: 180 tablet, Rfl: 0   meloxicam (MOBIC) 7.5 MG tablet, TAKE 1 TABLET (7.5 MG TOTAL) BY MOUTH DAILY. ARTHRITIS (NEEDS TO BE SEEN BEFORE NEXT REFILL), Disp: 30 tablet, Rfl: 0   metoprolol tartrate (LOPRESSOR) 25 MG tablet, TAKE 1 TABLET BY MOUTH TWICE A DAY, Disp: 60 tablet, Rfl: 3   omeprazole (PRILOSEC) 20 MG capsule, Take 1 capsule (20 mg total) by mouth daily. heartburn, Disp: 90 capsule, Rfl: 0   QUEtiapine (SEROQUEL) 25 MG tablet, TAKE 1 TABLET BY MOUTH ONCE DAILY AT BEDTIME, Disp: 90 tablet, Rfl: 0 Social History   Socioeconomic History   Marital status: Married    Spouse name: Not on file   Number of children: Not on file   Years of education: Not on file   Highest education level: Not on file  Occupational History   Not on file  Tobacco Use   Smoking status: Every Day    Packs/day: 0.33    Years: 43.00    Pack years: 14.19      Types: Cigarettes   Smokeless tobacco: Former    Types: Snuff  Vaping Use   Vaping Use: Never used  Substance and Sexual Activity   Alcohol use: Yes    Alcohol/week: 2.0 standard drinks    Types: 2 Cans of beer per week    Comment: 10/09/2017 "~ 2 beers/wk"   Drug use: No   Sexual activity: Not Currently  Other Topics Concern   Not on file  Social History Narrative   Not on file   Social Determinants of Health   Financial Resource Strain: Low Risk    Difficulty of Paying Living Expenses: Not hard at all  Food Insecurity: No Food Insecurity   Worried About Charity fundraiser in the Last Year: Never true   [place] in the Last Year: Never true  Transportation Needs: No Transportation Needs    Lack of Transportation (Medical): No   Lack of Transportation (Non-Medical): No  Physical Activity: Inactive   Days of Exercise per Week: 0 days   Minutes of Exercise per Session: 0 min  Stress: Stress Concern Present   Feeling of Stress : To some extent  Social Connections: Moderately Isolated   Frequency of Communication with Friends and Family: More than three times a week   Frequency of Social Gatherings with Friends and Family: More than three times a week   Attends Religious Services: Never   Marine scientist or Organizations: No   Attends Music therapist: Never   Marital Status: Married  Human resources officer Violence: Not At Risk   Fear of Current or Ex-Partner: No   Emotionally Abused: No   Physically Abused: No   Sexually Abused: No   Family History  Problem Relation Age of Onset   Heart disease Mother    Hyperlipidemia Mother    Heart disease Father    Hyperlipidemia Father     Objective: Office vital signs reviewed. BP (!) 142/83   Pulse (!) 112   Temp (!) 97.4 F (36.3 C)   Ht 5' 5" (1.651 m)   Wt 109 lb 6.4 oz (49.6 kg)   SpO2 98%   BMI 18.21 kg/m   Physical Examination:  General: Awake, alert, malnourished, No acute distress HEENT: Sclera white Cardio: regular rate  Pulm: normal work of breathing on room air MSK: Ambulating with some assistance Neuro: No tremor but seems to doze during her visit Psych: Has some features of tardive dyskinesia.  Seems easily distracted and often looks to her daughter for answers  Assessment/ Plan: 60 y.o. female   Nausea and vomiting, intractability of vomiting not specified, unspecified vomiting type - Plan: ondansetron (ZOFRAN-ODT) disintegrating tablet 4 mg  Alcoholism ([place]) - Plan: CMP14+EGFR, CBC, Folate, Vitamin B12  Moderate protein-calorie malnutrition ([place]) - Plan: CMP14+EGFR, CBC, Folate, Vitamin B12  Alcohol-induced mood disorder (HCC)  DDD (degenerative disc disease), lumbar - Plan:  tiZANidine (ZANAFLEX) 2 MG tablet, meloxicam (MOBIC) 7.5 MG tablet  Gastroesophageal reflux disease without esophagitis - Plan: omeprazole (PRILOSEC) 20 MG capsule  Uncertain etiology of nausea and vomiting but she does have history of alcoholism.  She was given a dose of Zofran in office and then sent home with a few doses to have on hand if she needs it.  Encourage p.o. hydration.  She was able to tolerate some fluid during her visit  Check CMP, CBC, folic acid and vitamin B12 given known history of alcoholism.  We again had a conversation about how this impacts  her overall health including nutrition, mood etc.  Would really like her to be consuming ensures but I think that there is been issues with getting this covered through insurance previously.  We again discussed that her mood disorder will be unchanged if she cannot get a hold of her alcoholism.  She is treated with medications at max doses for age yet still has depressive symptoms.  I have ordered virtual behavioral health and hope that they will be able to provide some assistance and counseling.  Her meloxicam and tizanidine have been renewed for her DDD which is chronic and stable  I have renewed her PPI as this does not seem to be refilled in quite some time.  I wonder if some of her nausea may be related to uncontrolled GERD  No orders of the defined types were placed in this encounter.  No orders of the defined types were placed in this encounter.    Janora Norlander, DO [place] [phone]

## 2020-12-27 ENCOUNTER — Other Ambulatory Visit: Payer: Self-pay

## 2020-12-27 ENCOUNTER — Encounter: Payer: Self-pay | Admitting: Family Medicine

## 2020-12-27 ENCOUNTER — Ambulatory Visit (INDEPENDENT_AMBULATORY_CARE_PROVIDER_SITE_OTHER): Payer: Medicare Other | Admitting: Family Medicine

## 2020-12-27 VITALS — BP 142/83 | HR 112 | Temp 97.4°F | Ht 65.0 in | Wt 109.4 lb

## 2020-12-27 DIAGNOSIS — R112 Nausea with vomiting, unspecified: Secondary | ICD-10-CM

## 2020-12-27 DIAGNOSIS — F1020 Alcohol dependence, uncomplicated: Secondary | ICD-10-CM | POA: Diagnosis not present

## 2020-12-27 DIAGNOSIS — F1094 Alcohol use, unspecified with alcohol-induced mood disorder: Secondary | ICD-10-CM | POA: Diagnosis not present

## 2020-12-27 DIAGNOSIS — R569 Unspecified convulsions: Secondary | ICD-10-CM

## 2020-12-27 DIAGNOSIS — M5136 Other intervertebral disc degeneration, lumbar region: Secondary | ICD-10-CM

## 2020-12-27 DIAGNOSIS — E440 Moderate protein-calorie malnutrition: Secondary | ICD-10-CM | POA: Diagnosis not present

## 2020-12-27 DIAGNOSIS — K219 Gastro-esophageal reflux disease without esophagitis: Secondary | ICD-10-CM

## 2020-12-27 MED ORDER — MELOXICAM 7.5 MG PO TABS: 7.5000 mg | ORAL_TABLET | Freq: Every day | ORAL | 3 refills | Status: DC

## 2020-12-27 MED ORDER — TIZANIDINE HCL 2 MG PO TABS: 2.0000 mg | ORAL_TABLET | Freq: Two times a day (BID) | ORAL | 4 refills | Status: DC | PRN

## 2020-12-27 MED ORDER — OMEPRAZOLE 20 MG PO CPDR: 20.0000 mg | DELAYED_RELEASE_CAPSULE | Freq: Every day | ORAL | 3 refills | Status: DC

## 2020-12-27 MED ORDER — ONDANSETRON 4 MG PO TBDP
4.0000 mg | ORAL_TABLET | Freq: Once | ORAL | Status: AC
  Administered 2020-12-27: 4 mg via ORAL

## 2020-12-27 MED ORDER — TRAZODONE HCL 150 MG PO TABS: 75.0000 mg | ORAL_TABLET | Freq: Every evening | ORAL | 1 refills | Status: DC | PRN

## 2020-12-27 MED ORDER — ONDANSETRON 4 MG PO TBDP: 4.0000 mg | ORAL_TABLET | Freq: Three times a day (TID) | ORAL | 0 refills | Status: DC | PRN

## 2020-12-27 NOTE — Patient Instructions (Signed)
We talked about the risk of Trazodone, Prozac and Seroquel today. A counselor will be calling her soon

## 2020-12-28 LAB — CBC
Hematocrit: 35.6 % (ref 34.0–46.6)
Hemoglobin: 11.7 g/dL (ref 11.1–15.9)
MCH: 28.1 pg (ref 26.6–33.0)
MCHC: 32.9 g/dL (ref 31.5–35.7)
MCV: 85 fL (ref 79–97)
Platelets: 291 10*3/uL (ref 150–450)
RBC: 4.17 x10E6/uL (ref 3.77–5.28)
RDW: 14.9 % (ref 11.7–15.4)
WBC: 6.3 10*3/uL (ref 3.4–10.8)

## 2020-12-28 LAB — CMP14+EGFR
ALT: 44 IU/L — ABNORMAL HIGH (ref 0–32)
AST: 54 IU/L — ABNORMAL HIGH (ref 0–40)
Albumin/Globulin Ratio: 2.3 — ABNORMAL HIGH (ref 1.2–2.2)
Albumin: 5 g/dL — ABNORMAL HIGH (ref 3.8–4.8)
Alkaline Phosphatase: 83 IU/L (ref 44–121)
BUN/Creatinine Ratio: 15 (ref 12–28)
BUN: 20 mg/dL (ref 8–27)
Bilirubin Total: 0.3 mg/dL (ref 0.0–1.2)
CO2: 20 mmol/L (ref 20–29)
Calcium: 9.4 mg/dL (ref 8.7–10.3)
Chloride: 101 mmol/L (ref 96–106)
Creatinine, Ser: 1.36 mg/dL — ABNORMAL HIGH (ref 0.57–1.00)
Globulin, Total: 2.2 g/dL (ref 1.5–4.5)
Glucose: 71 mg/dL (ref 65–99)
Potassium: 4.2 mmol/L (ref 3.5–5.2)
Sodium: 141 mmol/L (ref 134–144)
Total Protein: 7.2 g/dL (ref 6.0–8.5)
eGFR: 44 mL/min/{1.73_m2} — ABNORMAL LOW (ref >59)

## 2020-12-28 LAB — FOLATE: Folate: >20 ng/mL (ref >3.0)

## 2020-12-28 LAB — VITAMIN B12: Vitamin B-12: 541 pg/mL (ref 232–1245)

## 2020-12-29 NOTE — Progress Notes (Signed)
Returning nurse call.

## 2021-01-02 ENCOUNTER — Telehealth: Payer: Self-pay | Admitting: Family Medicine

## 2021-01-02 NOTE — Telephone Encounter (Signed)
Pts daughter called stating that she was calling office for pt wanting to know why her Tizanidine dosage was increased?

## 2021-01-02 NOTE — Progress Notes (Signed)
Pt has been scheduled    Penne Lash, RMA Care Guide, Embedded Care Coordination [facility]  [place], [place] [address] Direct Dial: [phone] [email or web address] Website: [email or web address]

## 2021-01-02 NOTE — Telephone Encounter (Signed)
Amelia aware

## 2021-01-02 NOTE — Telephone Encounter (Signed)
Because of her low weight, comorbidity.  Medication is considered high risk and had to be adjusted for her current state of health

## 2021-01-08 ENCOUNTER — Ambulatory Visit (INDEPENDENT_AMBULATORY_CARE_PROVIDER_SITE_OTHER): Payer: Medicare Other | Admitting: *Deleted

## 2021-01-08 DIAGNOSIS — F1027 Alcohol dependence with alcohol-induced persisting dementia: Secondary | ICD-10-CM

## 2021-01-08 DIAGNOSIS — F331 Major depressive disorder, recurrent, moderate: Secondary | ICD-10-CM

## 2021-01-08 NOTE — Chronic Care Management (AMB) (Signed)
Chronic Care Management   CCM RN Visit Note  01/08/2021 Name: Betty Nelson MRN: [id number] DOB: 15-Aug-1960  Subjective: Betty Nelson is a 60 y.o. year old female who is a primary care patient of [name], DO. The care management team was consulted for assistance with disease management and care coordination needs.    Engaged with patient's daughter, Betty Nelson,  for follow up visit in response to provider referral for case management and/or care coordination services.   Consent to Services:  The patient was given information about Chronic Care Management services, agreed to services, and gave verbal consent prior to initiation of services.  Please see initial visit note for detailed documentation.   Patient agreed to services and verbal consent obtained.   Assessment: Review of patient past medical history, allergies, medications, health status, including review of consultants reports, laboratory and other test data, was performed as part of comprehensive evaluation and provision of chronic care management services.   SDOH (Social Determinants of Health) assessments and interventions performed:    CCM Care Plan  No Known Allergies  Outpatient Encounter Medications as of 01/08/2021  Medication Sig   feeding supplement, ENSURE COMPLETE, (ENSURE COMPLETE) LIQD Take 237 mLs by mouth 2 (two) times daily between meals. (Patient not taking: Reported on 12/27/2020)   fexofenadine (ALLEGRA) 180 MG tablet TAKE 1 TABLET BY MOUTH EVERY DAY   FLUoxetine (PROZAC) 40 MG capsule Take 1 capsule (40 mg total) by mouth daily.   folic acid (FOLVITE) 1 MG tablet Take 1 tablet (1 mg total) by mouth daily.   gabapentin (NEURONTIN) 600 MG tablet Take 1 tablet (600 mg total) by mouth at bedtime.   hydrOXYzine (ATARAX/VISTARIL) 25 MG tablet TAKE 1 TABLET (25 MG TOTAL) BY MOUTH EVERY 8 (EIGHT) HOURS AS NEEDED. FOR ANXIETY   levETIRAcetam (KEPPRA) 500 MG tablet Take 1 tablet (500 mg total) by mouth 2  (two) times daily.   meloxicam (MOBIC) 7.5 MG tablet Take 1 tablet (7.5 mg total) by mouth daily. Arthritis   omeprazole (PRILOSEC) 20 MG capsule Take 1 capsule (20 mg total) by mouth daily. heartburn   ondansetron (ZOFRAN ODT) 4 MG disintegrating tablet Take 1 tablet (4 mg total) by mouth every 8 (eight) hours as needed for nausea or vomiting.   QUEtiapine (SEROQUEL) 25 MG tablet TAKE 1 TABLET BY MOUTH ONCE DAILY AT BEDTIME   tiZANidine (ZANAFLEX) 2 MG tablet Take 1 tablet (2 mg total) by mouth 2 (two) times daily as needed for muscle spasms.   traZODone (DESYREL) 150 MG tablet Take 0.5-1 tablets (75-150 mg total) by mouth at bedtime as needed for sleep.   No facility-administered encounter medications on file as of 01/08/2021.    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) 07/23/2019   Alcoholism with psychosis, with hallucinations (HCC) 07/23/2019   Wernicke's encephalopathy with cerebellar manifestation 07/23/2019   Seizures (HCC) 07/23/2019   Todd's paralysis (HCC) 10/05/2017   Alcohol abuse 10/05/2017   Encephalopathy    Allergic rhinitis 05/20/2017   Primary insomnia 11/18/2016   DDD (degenerative disc disease), lumbar 05/20/2016   Moderate episode of recurrent major depressive disorder (HCC) 05/20/2016   Essential hypertension 05/20/2016    Conditions to be addressed/monitored:Depression, Dementia, and malnutrition and alcohol dependence  Care Plan : [facility] Care Plan  Updates made by Gwenith Daily, RN since  01/08/2021 12:00 AM     Problem: Chronic Disease Management Needs   Priority: Medium  Onset Date: 01/08/2021     Long-Range Goal: Patient/Daughter will Work with [facility] Regarding Care Management and Care Coordination Associated with Chronic Medical Conditions   Start Date: 01/08/2021  This Visit's  Progress: On track  Priority: Medium  Note:   Current Barriers:  Care Coordination needs related to Financial constraints related to cost of nutritional supplements (Ensure), Mental Health Concerns , Substance abuse issues -  alcoholism, Memory Deficits, and Inability to perform IADL's independently  Chronic Disease Management support and education needs related to dementia, MDD, alcoholism, severe protein/calorie malnutrition   RNCM Clinical Goal(s):  Patient will continue to work with RN Care Manager to address care management and care coordination needs related to Depression, Dementia, and alcoholism and severe protein/calorie malnutrition  work with Child psychotherapist to address Mental Health Concerns  and Substance abuse issues -  alcohol related to the management of Depression, Dementia, and alcoholism work with Medical illustrator and PCP  to complete Application for Patient Assistance for [employer] through [employer]  through collaboration with Medical illustrator, provider, and care team.   Interventions: 1:1 collaboration with primary care provider regarding development and update of comprehensive plan of care as evidenced by provider attestation and co-signature Inter-disciplinary care team collaboration (see longitudinal plan of care) Evaluation of current treatment plan related to  self management and patient's adherence to plan as established by provider Discussed plans with patient for ongoing care management follow up and provided patient with direct contact information for care management team   Malnutrition r/t Decreased Appetite and Alcoholism:  (Status: New goal.) Evaluation of current treatment plan related to Depression, Dementia, and malnutrition , ADL IADL limitations and Substance abuse issues -  alcohol  self-management and patient's adherence to plan as established by provider. Talked with daughter, Betty Nelson, by telephone Assessed social determinant of health barriers;   Completed application for patient assistance for Ensure through [employer] with LCSW regarding current health state and need for nutritional supplement Collaborated with PCP and [facility] front office staff regarding application for assistance through [employer] Discussed family/social support Lives at home with her husband and adult daughter, Betty Nelson Primary care provided by Betty Nelson Discussed mobility and ADLs Patient is able to perform most ADLs with some assistance She does walk around inside the home but does not exercise and does not walk outside Discussed diet and appetite Decreased appetite. Has used Megace in the past but it didn't seem to help.  Has had Ensure/Boost in the past but they are too expensive to use regularly Assisted with patient assistance application for Ensure Discussed impact of beer intake on appetite Does drink some water but note enough per daughter Reviewed and discussed medications and compliance Reviewed and discussed recent lab results. Liver functions were abnormal but have improved some. B12 and folate were normal. Discussed supplements Patient is taking a multivitamin and folate Assessed alcohol intake Drinking 2 beers a day. Will sip on them throughout the day.  Advised that drinking them slowly throughout the day is likely negatively impacting her appetite Reviewed recent in-office weights. Increase in 8 lbs over past 6 months Monitor for weight loss. Weigh and record weekly and call PCP or RNCM with any weight loss. Provided time for questions  Reviewed upcoming appointment with LCSW in a few days and neurology in Nov 2022. Provided with RN Care Manager contact number and encouraged to reach out  as needed  Patient Goals/Self-Care Activities: Patient will work with daughter to complete ADLs and IADLs       Plan:Telephone follow up appointment with care management team member scheduled for:  01/22/2021 with RNCM and The patient  has been provided with contact information for the care management team and has been advised to call with any health related questions or concerns.   Demetrios Loll, BSN, RN-BC Embedded Chronic Care Manager [facility] / [facility] Care Management Direct Dial: [phone]

## 2021-01-08 NOTE — Patient Instructions (Signed)
Visit Information  PATIENT GOALS:  Goals Addressed             This Visit's Progress    Increase Nutritional Intake and Decrease Alcohol Intake       Timeframe:  Long-Range Goal Priority:  High Start Date:  01/08/21                           Expected End Date: 05/09/21                       Follow-up: 01/22/2021  Complete application for [employer] for assistance with Ensure and return to Dr [employer] nurse, Alyssa Decrease alcohol intake Try to increase the amount of fat and protein in your diet Increase water intake Slowly increase activity level with a goal of at least 150 minutes a week Continue to take medication and vitamins as prescribed Keep all medical appointments Weight weekly and record. Call PCP or RN Care Manager with any weight loss.  Call RN Care Manager as needed [phone]         Patient verbalizes understanding of instructions provided today and agrees to view in MyChart.   Plan:Telephone follow up appointment with care management team member scheduled for:  01/22/2021 with RNCM and The patient has been provided with contact information for the care management team and has been advised to call with any health related questions or concerns.   Demetrios Loll, BSN, RN-BC Embedded Chronic Care Manager [facility] / [facility] Care Management Direct Dial: [phone]

## 2021-01-11 ENCOUNTER — Ambulatory Visit: Payer: Medicare Other | Admitting: Licensed Clinical Social Worker

## 2021-01-11 DIAGNOSIS — F331 Major depressive disorder, recurrent, moderate: Secondary | ICD-10-CM

## 2021-01-11 DIAGNOSIS — F319 Bipolar disorder, unspecified: Secondary | ICD-10-CM

## 2021-01-11 DIAGNOSIS — R569 Unspecified convulsions: Secondary | ICD-10-CM

## 2021-01-11 DIAGNOSIS — I10 Essential (primary) hypertension: Secondary | ICD-10-CM

## 2021-01-11 DIAGNOSIS — F1027 Alcohol dependence with alcohol-induced persisting dementia: Secondary | ICD-10-CM

## 2021-01-11 NOTE — Chronic Care Management (AMB) (Signed)
Chronic Care Management    Clinical Social Work Note  01/11/2021 Name: Betty Nelson MRN: [id number] DOB: 02/22/1961  Betty Nelson is a 60 y.o. year old female who is a primary care patient of Betty Nelson. The CCM team was consulted to assist the patient with chronic disease management and/or care coordination needs related to: Community Resources .   Engaged with patient / daughter of patient, Betty Nelson,by telephone for initial visit in response to provider referral for social work chronic care management and care coordination services.   Consent to Services:  The patient was given the following information about Chronic Care Management services today, agreed to services, and gave verbal consent: 1. CCM service includes personalized support from designated clinical staff supervised by the primary care provider, including individualized plan of care and coordination with other care providers 2. 24/7 contact phone numbers for assistance for urgent and routine care needs. 3. Service will only be billed when office clinical staff spend 20 minutes or more in a month to coordinate care. 4. Only one practitioner may furnish and bill the service in a calendar month. 5.The patient may stop CCM services at any time (effective at the end of the month) by phone call to the office staff. 6. The patient will be responsible for cost sharing (co-pay) of up to 20% of the service fee (after annual deductible is met). Patient agreed to services and consent obtained.  Patient agreed to services and consent obtained.   Assessment: Review of patient past medical history, allergies, medications, and health status, including review of relevant consultants reports was performed today as part of a comprehensive evaluation and provision of chronic care management and care coordination services.     SDOH (Social Determinants of Health) assessments and interventions performed:  SDOH Interventions     Flowsheet Row Most Recent Value  SDOH Interventions   Physical Activity Interventions Other (Comments)  [client has difficulty walking long distances, uses a walker to ambulate]  Depression Interventions/Treatment  Medication, Currently on Treatment        Advanced Directives Status: See [employer] application for related entries.  CCM Care Plan  No Known Allergies  Outpatient Encounter Medications as of 01/11/2021  Medication Sig   feeding supplement, ENSURE COMPLETE, (ENSURE COMPLETE) LIQD Take 237 mLs by mouth 2 (two) times daily between meals. (Patient not taking: Reported on 12/27/2020)   fexofenadine (ALLEGRA) 180 MG tablet TAKE 1 TABLET BY MOUTH EVERY DAY   FLUoxetine (PROZAC) 40 MG capsule Take 1 capsule (40 mg total) by mouth daily.   folic acid (FOLVITE) 1 MG tablet Take 1 tablet (1 mg total) by mouth daily.   gabapentin (NEURONTIN) 600 MG tablet Take 1 tablet (600 mg total) by mouth at bedtime.   hydrOXYzine (ATARAX/VISTARIL) 25 MG tablet TAKE 1 TABLET (25 MG TOTAL) BY MOUTH EVERY 8 (EIGHT) HOURS AS NEEDED. FOR ANXIETY   levETIRAcetam (KEPPRA) 500 MG tablet Take 1 tablet (500 mg total) by mouth 2 (two) times daily.   meloxicam (MOBIC) 7.5 MG tablet Take 1 tablet (7.5 mg total) by mouth daily. Arthritis   omeprazole (PRILOSEC) 20 MG capsule Take 1 capsule (20 mg total) by mouth daily. heartburn   ondansetron (ZOFRAN ODT) 4 MG disintegrating tablet Take 1 tablet (4 mg total) by mouth every 8 (eight) hours as needed for nausea or vomiting.   QUEtiapine (SEROQUEL) 25 MG tablet TAKE 1 TABLET BY MOUTH ONCE DAILY AT BEDTIME   tiZANidine (ZANAFLEX) 2 MG tablet   Take 1 tablet (2 mg total) by mouth 2 (two) times daily as needed for muscle spasms.   traZODone (DESYREL) 150 MG tablet Take 0.5-1 tablets (75-150 mg total) by mouth at bedtime as needed for sleep.   No facility-administered encounter medications on file as of 01/11/2021.    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 ([place]) 07/23/2019   Alcoholism with psychosis, with hallucinations ([place]) 07/23/2019   Wernicke's encephalopathy with cerebellar manifestation 07/23/2019   Seizures ([place]) 07/23/2019   Todd's paralysis ([place]) 10/05/2017   Alcohol abuse 10/05/2017   Encephalopathy    Allergic rhinitis 05/20/2017   Primary insomnia 11/18/2016   DDD (degenerative disc disease), lumbar 05/20/2016   Moderate episode of recurrent major depressive disorder ([place]) 05/20/2016   Essential hypertension 05/20/2016    Conditions to be addressed/monitored: monitor client management of depression symptoms  Care Plan : Nelson Care Plan  Updates made by Betty Nelson since 01/11/2021 12:00 AM     Problem: Depression Identification (Depression)      Goal: Depressive Symptoms Identified; Manage depression symptoms faced   Start Date: 01/11/2021  Expected End Date: 04/10/2021  This Visit's Progress: On track  Priority: Medium  Note:   Current Barriers:  Mental Health needs related to depression and depression management Challenges in Walking Alcohol use daily (2 beers per day) Some pain issues Suicidal Ideation/Homicidal Ideation: No  Clinical Social Work Goal(s):  patient will work with SW monthly by telephone or in person to reduce or manage symptoms related to depression Patient will communicate with RNCM monthly as needed for CCM nursing support Patient will attend scheduled medical appointments in next 30 days  Interventions: PHQ-9 completed. GAD-7 completed.  1:1 collaboration with Betty Nelson regarding development and update of comprehensive plan of care as evidenced by provider attestation and co-signature Discussed with Betty Nelson, daughter of client, the current needs of client Discussed with Betty Nelson client use of medications.   Betty Nelson said client has prescribed medications and is taking medications as prescribed Discussed with Betty Nelson hallucinations of client. Betty Nelson feels that client hallucinations are reduced, not happening as often as previously Reviewed with Betty Nelson sleeping issues of client  Reviewed with Betty appetite of client.  Betty Nelson feels that client eats small meals, picks at her food for meals. However, client has had some weight gain, per [place] with Betty Nelson hearing needs of [facility]. Betty Nelson feels that client has some hearing loss. Client is not using hearing aids currently Reviewed with Betty Nelson the current mood status of client.  Betty Nelson feels that client is sad periodically.  Client is taking Prozac as prescribed.  Client is taking Seroquel as prescribed Discussed with Betty Nelson the relaxation techniques for client (client likes to Nelson crossword puzzles and likes to read ) Discussed with Betty Nelson the current alcohol use of client .  Betty Nelson said client has not been in alcohol treatment program previously.  Betty Nelson said client does not use hard liquor but only drinks 2 beers a day Reviewed with Betty Nelson client support from neurologist. Client has appointment with neurologist in November of 2022.   Provided counseling support for Betty related to client needs  Patient Self Care Activities:  Completes ADLs Tries to attend scheduled medical appointments Takes medications as prescribed  Patient Coping Strengths:  Family Friends  Patient Self Care Deficits:  Mobility issues Alcohol  use Dementia  Patient Goals:  - spend time or talk with others at least 2 to 3 times per week - practice relaxation or meditation daily - keep a calendar with appointment dates  Follow Up Plan: Nelson to call client or Betty Koo  on 03/06/21 at 4:00 PM to assess client needs    S. MSW, Nelson Licensed Clinical Social Worker [facility] Care Management [phone]  

## 2021-01-11 NOTE — Patient Instructions (Signed)
Visit Information  PATIENT GOALS:  Goals Addressed             This Visit's Progress    Depressive Symptoms Identified. Manage Depression symptoms faced       Goal:  Short Term Goal Progress:  On Track Priority:  Medium Start Date: 01/11/21 Expected end date:  04/11/20  Follow Up Date:  03/06/21 at 4:00 PM  Manage Depression Symptoms faced  Patient Self Care Activities:  Completes ADLs Tries to attend scheduled medical appointments Takes medications as prescribed  Patient Coping Strengths:  Family Friends  Patient Self Care Deficits:  Mobility issues Alcohol use Dementia  Patient Goals:  - spend time or talk with others at least 2 to 3 times per week - practice relaxation or meditation daily - keep a calendar with appointment dates  Follow Up Plan: LCSW to call client or [name] Linn  on 03/06/21 at 4:00 PM to assess client needs    Kelton Pillar.Kjersti Dittmer MSW, LCSW Licensed Clinical Social Worker [facility] Care Management [phone]

## 2021-01-22 ENCOUNTER — Telehealth: Payer: Medicare Other | Admitting: *Deleted

## 2021-01-23 ENCOUNTER — Telehealth: Payer: Self-pay | Admitting: *Deleted

## 2021-01-23 NOTE — Telephone Encounter (Signed)
  Care Management   Follow Up Note   01/22/2021 Name: Betty Nelson MRN: [id number] DOB: 12-20-1960   Referred by: [name], DO Reason for referral : Chronic Care Management (Unsuccessful telephone follow up)   An unsuccessful telephone outreach was attempted today. The patient was referred to the case management team for assistance with care management and care coordination.   Follow Up Plan: A HIPPA compliant phone message was left for the patient providing contact information and requesting a return call. Forwarding to [facility] Care Guide for outreach and rescheduling.   Demetrios Loll, BSN, RN-BC Embedded Chronic Care Manager [facility] / [facility] Care Management Direct Dial: [phone]

## 2021-01-29 ENCOUNTER — Ambulatory Visit: Payer: Medicare Other | Admitting: *Deleted

## 2021-01-29 DIAGNOSIS — F331 Major depressive disorder, recurrent, moderate: Secondary | ICD-10-CM

## 2021-01-29 DIAGNOSIS — E440 Moderate protein-calorie malnutrition: Secondary | ICD-10-CM

## 2021-01-29 DIAGNOSIS — F1027 Alcohol dependence with alcohol-induced persisting dementia: Secondary | ICD-10-CM

## 2021-01-29 NOTE — Patient Instructions (Signed)
Visit Information  Patient Goals/Self-Care Activities: Patient will work with daughter to complete ADLs and IADLs Patient/daughter will complete application for Ensure assistance through [employer]  Daughter will return application to Dr Delynn Flavin nurse, Arlyss Repress Encouraged to reach out to [employer] as needed [phone] Patient will take all medications and supplements as prescribed Patient will keep all medical appointments Patient will monitor weight and report any weight loss Patient will eliminate alcohol use    Patient verbalizes understanding of instructions provided today and agrees to view in MyChart.   Telephone follow up appointment with care management team member scheduled for:02/27/21 with RNCM  Demetrios Loll, BSN, RN-BC Embedded Chronic Care Manager [facility] / [facility] Care Management Direct Dial: [phone]

## 2021-01-29 NOTE — Chronic Care Management (AMB) (Signed)
Chronic Care Management   CCM RN Visit Note  01/29/2021 Name: Betty Nelson MRN: [id number] DOB: 1961/02/05  Subjective: Betty Nelson is a 60 y.o. year old female who is a primary care patient of Betty Ip, DO. The care management team was consulted for assistance with disease management and care coordination needs.    Engaged with patient's daughter, Betty Nelson, by telephone  for follow up visit in response to provider referral for case management and/or care coordination services.   Consent to Services:  The patient was given information about Chronic Care Management services, agreed to services, and gave verbal consent prior to initiation of services.  Please see initial visit note for detailed documentation.   Patient agreed to services and verbal consent obtained.   Assessment: Review of patient past medical history, allergies, medications, health status, including review of consultants reports, laboratory and other test data, was performed as part of comprehensive evaluation and provision of chronic care management services.   SDOH (Social Determinants of Health) assessments and interventions performed:    CCM Care Plan  No Known Allergies  Outpatient Encounter Medications as of 01/29/2021  Medication Sig   feeding supplement, ENSURE COMPLETE, (ENSURE COMPLETE) LIQD Take 237 mLs by mouth 2 (two) times daily between meals. (Patient not taking: Reported on 12/27/2020)   fexofenadine (ALLEGRA) 180 MG tablet TAKE 1 TABLET BY MOUTH EVERY DAY   FLUoxetine (PROZAC) 40 MG capsule Take 1 capsule (40 mg total) by mouth daily.   folic acid (FOLVITE) 1 MG tablet Take 1 tablet (1 mg total) by mouth daily.   gabapentin (NEURONTIN) 600 MG tablet Take 1 tablet (600 mg total) by mouth at bedtime.   hydrOXYzine (ATARAX/VISTARIL) 25 MG tablet TAKE 1 TABLET (25 MG TOTAL) BY MOUTH EVERY 8 (EIGHT) HOURS AS NEEDED. FOR ANXIETY   levETIRAcetam (KEPPRA) 500 MG tablet Take 1 tablet (500 mg  total) by mouth 2 (two) times daily.   meloxicam (MOBIC) 7.5 MG tablet Take 1 tablet (7.5 mg total) by mouth daily. Arthritis   omeprazole (PRILOSEC) 20 MG capsule Take 1 capsule (20 mg total) by mouth daily. heartburn   ondansetron (ZOFRAN ODT) 4 MG disintegrating tablet Take 1 tablet (4 mg total) by mouth every 8 (eight) hours as needed for nausea or vomiting.   QUEtiapine (SEROQUEL) 25 MG tablet TAKE 1 TABLET BY MOUTH ONCE DAILY AT BEDTIME   tiZANidine (ZANAFLEX) 2 MG tablet Take 1 tablet (2 mg total) by mouth 2 (two) times daily as needed for muscle spasms.   traZODone (DESYREL) 150 MG tablet Take 0.5-1 tablets (75-150 mg total) by mouth at bedtime as needed for sleep.   No facility-administered encounter medications on file as of 01/29/2021.    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) 07/23/2019   Alcoholism with psychosis, with hallucinations (HCC) 07/23/2019   Wernicke's encephalopathy with cerebellar manifestation 07/23/2019   Seizures (HCC) 07/23/2019   Todd's paralysis (HCC) 10/05/2017   Alcohol abuse 10/05/2017   Encephalopathy    Allergic rhinitis 05/20/2017   Primary insomnia 11/18/2016   DDD (degenerative disc disease), lumbar 05/20/2016   Moderate episode of recurrent major depressive disorder (HCC) 05/20/2016   Essential hypertension 05/20/2016    Conditions to be addressed/monitored:Depression, Dementia, and alcohol use  Care Plan : [facility] Care Plan  Updates made by Gwenith Daily, RN since  01/29/2021 12:00 AM     Problem: Chronic Disease Management Needs   Priority: Medium  Onset Date: 01/08/2021     Long-Range Goal: Patient/Daughter will Work with [facility] Regarding Care Management and Care Coordination Associated with Chronic Medical Conditions   Start Date: 01/08/2021  This Visit's  Progress: On track  Recent Progress: On track  Priority: Medium  Note:   Current Barriers:  Care Coordination needs related to Financial constraints related to cost of nutritional supplements (Ensure), Mental Health Concerns , Substance abuse issues -  alcoholism, Memory Deficits, and Inability to perform IADL's independently  Chronic Disease Management support and education needs related to dementia, MDD, alcoholism, severe protein/calorie malnutrition   RNCM Clinical Goal(s):  Patient will continue to work with RN Care Manager to address care management and care coordination needs related to Depression, Dementia, and alcoholism and severe protein/calorie malnutrition  work with Child psychotherapist to address Mental Health Concerns  and Substance abuse issues -  alcohol related to the management of Depression, Dementia, and alcoholism work with Medical illustrator and PCP  to complete Application for Patient Assistance for [employer] through [employer]  through collaboration with Medical illustrator, provider, and care team.   Interventions: 1:1 collaboration with primary care provider regarding development and update of comprehensive plan of care as evidenced by provider attestation and co-signature Inter-disciplinary care team collaboration (see longitudinal plan of care) Evaluation of current treatment plan related to  self management and patient's adherence to plan as established by provider Discussed plans with patient/daughter for ongoing care management follow up and provided patient with direct contact information for care management team   Malnutrition r/t Decreased Appetite and Alcoholism:  (Status: Goal on track: YES.) Evaluation of current treatment plan related to Depression, Dementia, and malnutrition , ADL IADL limitations and Substance abuse issues -  alcohol  self-management and patient's adherence to plan as established by provider. Talked with daughter, Betty Nelson, by  telephone Assessed social determinant of health barriers;  Completed application for patient assistance for Ensure through [employer]  Previously collaborated with [employer] office Staff to mail application to patient Confirmed with daughter, Betty Nelson, that they have received the application but have not completed it Advised that once complete, she should return it to Dr Delynn Flavin nurse, Arlyss Repress Previously discussed family/social support Lives at home with her husband and adult daughter, Betty Nelson Primary care provided by Betty Nelson Previously discussed mobility and ADLs Patient is able to perform most ADLs with some assistance She does walk around inside the home but does not exercise and does not walk outside Discussed diet and appetite Decreased appetite but stable at this point  Has had Ensure/Boost in the past but they are too expensive to use regularly Assisted with patient assistance application for Ensure Discussed impact of beer intake on appetite Does drink some water but note enough per daughter Previously discussed supplements Patient is taking a multivitamin and folate Previously assessed alcohol intake Drinking 2 beers a day. Will sip on them throughout the day.  Advised that drinking them slowly throughout the day is likely negatively impacting her appetite Reviewed recent in-office weights. Increase in 8 lbs over past 6 months Monitor for weight loss. Weigh and record weekly and call PCP or RNCM with any weight loss. Per patient's daughter, no weight loss since last telephone visit Provided time for questions  Reviewed upcoming appointment with LCSW in a few days and neurology in Nov 2022. Provided with RN Care Manager contact number and encouraged  to reach out as needed  Patient Goals/Self-Care Activities: Patient will work with daughter to complete ADLs and IADLs Patient/daughter will complete application for Ensure assistance through [employer]  Daughter will  return application to Dr Delynn Flavin nurse, Arlyss Repress Encouraged to reach out to [employer] as needed [phone] Patient will take all medications and supplements as prescribed Patient will keep all medical appointments Patient will monitor weight and report any weight loss Patient will eliminate alcohol use       Plan:Telephone follow up appointment with care management team member scheduled for:  02/27/21 with RNCM The patient has been provided with contact information for the care management team and has been advised to call with any health related questions or concerns.   Demetrios Loll, BSN, RN-BC Embedded Chronic Care Manager [facility] / [facility] Care Management Direct Dial: [phone]

## 2021-02-05 DIAGNOSIS — I10 Essential (primary) hypertension: Secondary | ICD-10-CM | POA: Diagnosis not present

## 2021-02-05 DIAGNOSIS — F1027 Alcohol dependence with alcohol-induced persisting dementia: Secondary | ICD-10-CM | POA: Diagnosis not present

## 2021-02-05 DIAGNOSIS — F331 Major depressive disorder, recurrent, moderate: Secondary | ICD-10-CM | POA: Diagnosis not present

## 2021-02-18 ENCOUNTER — Other Ambulatory Visit: Payer: Self-pay | Admitting: Neurology

## 2021-02-27 ENCOUNTER — Telehealth: Payer: Medicare Other | Admitting: *Deleted

## 2021-02-27 ENCOUNTER — Telehealth: Payer: Self-pay | Admitting: *Deleted

## 2021-02-27 NOTE — Telephone Encounter (Signed)
  Care Management   Follow Up Note   02/27/2021 Name: Betty Nelson MRN: [id number] DOB: September 22, 1960   Referred by: [name], DO Reason for referral : Chronic Care Management (Unsuccessful follow-up)   A second unsuccessful telephone outreach was attempted today. The patient was referred to the case management team for assistance with care management and care coordination.   Follow Up Plan: A HIPPA compliant phone message was left for the patient providing contact information and requesting a return call. Forwarding to [facility] Care Guide for outreach and rescheduling.   Demetrios Loll, BSN, RN-BC Embedded Chronic Care Manager [facility] / [facility] Care Management Direct Dial: [phone]

## 2021-03-06 ENCOUNTER — Ambulatory Visit: Payer: Medicare Other | Admitting: Neurology

## 2021-03-06 ENCOUNTER — Telehealth: Payer: Medicare Other

## 2021-03-09 ENCOUNTER — Telehealth: Payer: Self-pay | Admitting: Family Medicine

## 2021-03-12 ENCOUNTER — Telehealth: Payer: Self-pay

## 2021-03-12 NOTE — Chronic Care Management (AMB) (Signed)
  Care Management   Note  03/12/2021 Name: Betty Nelson MRN: [id number] DOB: [birth year]  Betty Nelson is a 60 y.o. year old female who is a primary care patient of [name], DO and is actively engaged with the care management team. I reached out to Camillia Herter Hird by phone today to assist with re-scheduling a follow up visit with the RN Case Manager  Follow up plan: Unsuccessful telephone outreach attempt made. A HIPAA compliant phone message was left for the patient providing contact information and requesting a return call.  The care management team will reach out to the patient again over the next 7 days.  If patient returns call to provider office, please advise to call Embedded Care Management Care Guide Shawntel Farnworth  at [phone]  Penne Lash, RMA Care Guide, Embedded Care Coordination [facility]  [place], [place] [address] Direct Dial: [phone] [email or web address] Website: [email or web address]

## 2021-03-15 ENCOUNTER — Ambulatory Visit (INDEPENDENT_AMBULATORY_CARE_PROVIDER_SITE_OTHER): Payer: Medicare Other

## 2021-03-15 DIAGNOSIS — Z0000 Encounter for general adult medical examination without abnormal findings: Secondary | ICD-10-CM | POA: Diagnosis not present

## 2021-03-15 NOTE — Progress Notes (Signed)
MEDICARE ANNUAL WELLNESS VISIT  03/15/2021  Telephone Visit Disclaimer This Medicare AWV was conducted by telephone due to national recommendations for restrictions regarding the COVID-19 Pandemic (e.g. social distancing).  I verified, using two identifiers, that I am speaking with Betty Nelson or their authorized healthcare agent. I discussed the limitations, risks, security, and privacy concerns of performing an evaluation and management service by telephone and the potential availability of an in-person appointment in the future. The patient expressed understanding and agreed to proceed.  Location of Patient: Home Location of Provider (nurse):  [facility]  Subjective:    Betty Nelson is a 60 y.o. female patient of Betty Norlander, DO who had a Medicare Annual Wellness Visit today via telephone. Betty Nelson is Retired and lives with their family. She has one daughter.  She reports that she is not socially active but does interact with friends/family. She is minimally physically active and enjoys spending time with her family.  Patient Care Team: Betty Norlander, DO as PCP - General (Family Medicine) Eloise Harman, DO as Consulting Physician (Internal Medicine) Ilean China, RN as Case Manager  Advanced Directives 03/15/2021 10/09/2017  Does Patient Have a Medical Advance Directive? No No  Would patient like information on creating a medical advance directive? No - Patient declined No - Patient declined    Hospital Utilization Over the Past 12 Months: # of hospitalizations or ER visits: 1 # of surgeries: 0  Review of Systems    Patient reports that her overall health is unchanged compared to last year.  History obtained from chart review and the patient  Patient Reported Readings (BP, Pulse, CBG, Weight, etc) none  Pain Assessment Pain : 0-10 Pain Score: 5  Pain Location: Back Pain Orientation: Lower Pain Descriptors / Indicators: Aching, Discomfort Pain  Onset: More than a month ago Pain Frequency: Constant Pain Relieving Factors: Nothing helps pain Effect of Pain on Daily Activities: Limits activities  Pain Relieving Factors: Nothing helps pain  Current Medications & Allergies (verified) Allergies as of 03/15/2021   No Known Allergies      Medication List        Accurate as of March 15, 2021  2:22 PM. If you have any questions, ask your nurse or doctor.          feeding supplement (ENSURE COMPLETE) Liqd Take 237 mLs by mouth 2 (two) times daily between meals.   fexofenadine 180 MG tablet Commonly known as: ALLEGRA TAKE 1 TABLET BY MOUTH EVERY DAY   FLUoxetine 40 MG capsule Commonly known as: PROZAC Take 1 capsule (40 mg total) by mouth daily.   folic acid 1 MG tablet Commonly known as: FOLVITE Take 1 tablet (1 mg total) by mouth daily.   gabapentin 600 MG tablet Commonly known as: NEURONTIN Take 1 tablet (600 mg total) by mouth at bedtime.   hydrOXYzine 25 MG tablet Commonly known as: ATARAX TAKE 1 TABLET (25 MG TOTAL) BY MOUTH EVERY 8 (EIGHT) HOURS AS NEEDED. FOR ANXIETY   levETIRAcetam 500 MG tablet Commonly known as: KEPPRA Take 1 tablet (500 mg total) by mouth 2 (two) times daily. Please keep upcoming appt for continued refills   meloxicam 7.5 MG tablet Commonly known as: MOBIC Take 1 tablet (7.5 mg total) by mouth daily. Arthritis   omeprazole 20 MG capsule Commonly known as: PRILOSEC Take 1 capsule (20 mg total) by mouth daily. heartburn   ondansetron 4 MG disintegrating tablet Commonly known as: Zofran ODT  Take 1 tablet (4 mg total) by mouth every 8 (eight) hours as needed for nausea or vomiting.   QUEtiapine 25 MG tablet Commonly known as: SEROQUEL TAKE 1 TABLET BY MOUTH ONCE DAILY AT BEDTIME   tiZANidine 2 MG tablet Commonly known as: ZANAFLEX Take 1 tablet (2 mg total) by mouth 2 (two) times daily as needed for muscle spasms.   traZODone 150 MG tablet Commonly known as: DESYREL Take  0.5-1 tablets (75-150 mg total) by mouth at bedtime as needed for sleep.        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) 07/23/2019   Chronic lower back pain    DDD (degenerative disc disease), lumbar    Depression    Disability of walking    Headache    "about q wk" (10/09/2017)   Hypertension    Seizure (HCC) 10/05/2017   "that was my 1st" (10/09/2017)   Todd's paralysis (HCC) 10/05/2017   Past Surgical History:  Procedure Laterality Date   CESAREAN SECTION  1985   TONSILLECTOMY     Family History  Problem Relation Age of Onset   Heart disease Mother    Hyperlipidemia Mother    Heart disease Father    Hyperlipidemia Father    Social History   Socioeconomic History   Marital status: Married    Spouse name: Not on file   Number of children: Not on file   Years of education: Not on file   Highest education level: Not on file  Occupational History   Not on file  Tobacco Use   Smoking status: Every Day    Packs/day: 0.33    Years: 43.00    Pack years: 14.19    Types: Cigarettes   Smokeless tobacco: Former    Types: Snuff  Vaping Use   Vaping Use: Never used  Substance and Sexual Activity   Alcohol use: Yes    Alcohol/week: 2.0 standard drinks    Types: 2 Cans of beer per week    Comment: 10/09/2017 "~ 2 beers/wk"   Drug use: No   Sexual activity: Not Currently  Other Topics Concern   Not on file  Social History Narrative   Not on file   Social Determinants of Health   Financial Resource Strain: Not on file  Food Insecurity: Not on file  Transportation Needs: No Transportation Needs   Lack of Transportation (Medical): No   Lack of Transportation (Non-Medical): No  Physical Activity: Inactive   Days of Exercise per Week: 0 days   Minutes of Exercise per Session: 0 min  Stress: Not on file  Social Connections: Moderately  Isolated   Frequency of Communication with Friends and Family: More than three times a week   Frequency of Social Gatherings with Friends and Family: More than three times a week   Attends Religious Services: Never   Database administrator or Organizations: No   Attends Banker Meetings: Never   Marital Status: Married    Activities of Daily Living In your present state of health, do you have any difficulty performing the following activities: 03/15/2021  Hearing? Y  Vision? N  Difficulty concentrating or making decisions? N  Walking or climbing stairs? Y  Dressing or bathing? N  Doing errands, shopping? Y  Preparing Food and eating ? N  Using the Toilet? N  In the past six months, have you accidently leaked urine? N  Do you have problems with loss of bowel control? N  Managing your Medications? Y  Managing your Finances? N  Housekeeping or managing your Housekeeping? Y  Some recent data might be hidden  Patient reports she has difficulty hearing in her left ear.  She experiences low back pain that is made worse with walking.  She does not do errands normally because of her back pain and unsteadiness on her feet.  Her daughter and husband run all of her errands for her and accompany her to the doctor.  Her daughter manages her finances and takes care of her housekeeping for her.  Patient Education/ Literacy How often do you need to have someone help you when you read instructions, pamphlets, or other written materials from your doctor or pharmacy?: 1 - Never What is the last grade level you completed in school?: 12th grade  Exercise Current Exercise Habits: The patient does not participate in regular exercise at present, Exercise limited by: orthopedic condition(s)  Diet Patient reports consuming 2 meals a day and 0 snack(s) a day Patient reports that her primary diet is: Regular Patient reports that she does have regular access to food.   Depression Screen PHQ 2/9  Scores 03/15/2021 01/11/2021 12/27/2020 07/18/2020 06/06/2020 02/04/2020 02/04/2020  PHQ - 2 Score 0 2 3 6 4 2 2   PHQ- 9 Score - 12 15 16 12 9 10   Exception Documentation - - - - - - -     Fall Risk Fall Risk  03/15/2021 12/27/2020 07/18/2020 07/23/2019 02/16/2019  Falls in the past year? 0 0 0 1 1  Number falls in past yr: - - - 1 1  Injury with Fall? - - - 0 0  Risk Factor Category  - - - - -  Risk for fall due to : - - - - Impaired balance/gait;Impaired mobility;Medication side effect  Follow up Falls evaluation completed - - - Falls prevention discussed     Objective:  Zoryana Monroe Tippins seemed alert and oriented and she participated appropriately during our telephone visit.  Blood Pressure Weight BMI  BP Readings from Last 3 Encounters:  12/27/20 (!) 142/83  07/18/20 (!) 78/48  06/06/20 106/70   Wt Readings from Last 3 Encounters:  12/27/20 109 lb 6.4 oz (49.6 kg)  07/18/20 101 lb 6.4 oz (46 kg)  06/06/20 100 lb (45.4 kg)   BMI Readings from Last 1 Encounters:  12/27/20 18.21 kg/m    *Unable to obtain current vital signs, weight, and BMI due to telephone visit type  Hearing/Vision  Amariah did not seem to have difficulty with hearing/understanding during the telephone conversation Reports that she has not had a formal eye exam by an eye care professional within the past year Reports that she has not had a formal hearing evaluation within the past year *Unable to fully assess hearing and vision during telephone visit type  Cognitive Function: 6CIT Screen 03/15/2021  What Year? 4 points  What month? 0 points  What time? 0 points  Count back from 20 2 points  Months in reverse 4 points  Repeat phrase 4 points  Total Score 14   (Normal:0-7, Significant for Dysfunction: >8)  Normal Cognitive Function Screening: Yes   Immunization & Health Maintenance Record Immunization History  Administered Date(s) Administered   Moderna SARS-COV2 Booster Vaccination 03/24/2020   Rabies,  IM 01/23/1996, 01/25/1996, 01/29/1996, 02/04/1996, 02/18/1996    Health Maintenance  Topic  Date Due   COVID-19 Vaccine (1) 11/17/1960   Pneumococcal Vaccine 68-75 Years old (1 - PCV) Never done   PAP SMEAR-Modifier  Never done   Zoster Vaccines- Shingrix (1 of 2) 03/28/2021 (Originally 05/19/1977)   TETANUS/TDAP  06/06/2021 (Originally 05/19/1977)   INFLUENZA VACCINE  07/06/2021 (Originally 11/06/2020)   MAMMOGRAM  12/27/2021 (Originally 05/19/2008)   COLONOSCOPY (Pts 45-80yrs Insurance coverage will need to be confirmed)  12/27/2021 (Originally 05/20/2003)   Hepatitis C Screening  Completed   HIV Screening  Completed   HPV VACCINES  Aged Out       Assessment  This is a routine wellness examination for [employer].  Health Maintenance: Due or Overdue Health Maintenance Due  Topic Date Due   COVID-19 Vaccine (1) 11/17/1960   Pneumococcal Vaccine 43-25 Years old (1 - PCV) Never done   PAP SMEAR-Modifier  Never done    Camillia Herter Pratte does not need a referral for Community Assistance: Care Management:   no Social Work:    no Prescription Assistance:  no Nutrition/Diabetes Education:  no   Plan:  Personalized Goals  Goals Addressed             This Visit's Progress    Patient Stated       03/15/2021 AWV Goal: Fall Prevention  Over the next year, patient will decrease their risk for falls by: Using assistive devices, such as a cane or walker, as needed Identifying fall risks within their home and correcting them by: Removing throw rugs Adding handrails to stairs or ramps Removing clutter and keeping a clear pathway throughout the home Increasing light, especially at night Adding shower handles/bars Raising toilet seat Identifying potential personal risk factors for falls: Medication side effects Incontinence/urgency Vestibular dysfunction Hearing loss Musculoskeletal disorders Neurological disorders Orthostatic hypotension         Personalized Health  Maintenance & Screening Recommendations  Pneumococcal vaccine  Td vaccine Screening mammography Screening Pap smear and pelvic exam  Colorectal cancer screening Shingrix vaccine  Lung Cancer Screening Recommended: yes (Low Dose CT Chest recommended if Age 19-80 years, 30 pack-year currently smoking OR have quit w/in past 15 years) Hepatitis C Screening recommended: no HIV Screening recommended: no  Advanced Directives: Written information was not prepared per patient's request.  Referrals & Orders No orders of the defined types were placed in this encounter.   Follow-up Plan Follow-up with [name], DO as planned Schedule colonoscopy Schedule pap smear Schedule mammogram   I have personally reviewed and noted the following in the patient's chart:   Medical and social history Use of alcohol, tobacco or illicit drugs  Current medications and supplements Functional ability and status Nutritional status Physical activity Advanced directives List of other physicians Hospitalizations, surgeries, and ER visits in previous 12 months Vitals Screenings to include cognitive, depression, and falls Referrals and appointments  In addition, I have reviewed and discussed with Camillia Herter Conover certain preventive protocols, quality metrics, and best practice recommendations. A written personalized care plan for preventive services as well as general preventive health recommendations is available and can be mailed to the patient at her request.      Mariam Dollar, LPN    01/0/9323

## 2021-03-20 ENCOUNTER — Telehealth: Payer: Medicare Other

## 2021-03-20 ENCOUNTER — Other Ambulatory Visit: Payer: Self-pay | Admitting: Family Medicine

## 2021-03-20 DIAGNOSIS — F319 Bipolar disorder, unspecified: Secondary | ICD-10-CM

## 2021-03-29 NOTE — Chronic Care Management (AMB) (Signed)
°  Care Management   Note  03/29/2021 Name: Betty Nelson MRN: [id number] DOB: January 20, 1961  [name] is a 60 y.o. year old female who is a primary care patient of [name], DO and is actively engaged with the care management team. I reached out to Camillia Herter Anand by phone today to assist with re-scheduling a follow up visit with the RN Case Manager  Follow up plan: Telephone appointment with care management team member scheduled for:04/13/2021  Penne Lash, RMA Care Guide, Embedded Care Coordination [facility]  [place], [place] [address] Direct Dial: [phone] [email or web address]

## 2021-04-13 ENCOUNTER — Ambulatory Visit (INDEPENDENT_AMBULATORY_CARE_PROVIDER_SITE_OTHER): Payer: Medicare HMO | Admitting: *Deleted

## 2021-04-13 DIAGNOSIS — E440 Moderate protein-calorie malnutrition: Secondary | ICD-10-CM

## 2021-04-13 DIAGNOSIS — F1020 Alcohol dependence, uncomplicated: Secondary | ICD-10-CM

## 2021-04-13 DIAGNOSIS — F331 Major depressive disorder, recurrent, moderate: Secondary | ICD-10-CM

## 2021-04-13 DIAGNOSIS — F1027 Alcohol dependence with alcohol-induced persisting dementia: Secondary | ICD-10-CM

## 2021-04-13 NOTE — Patient Instructions (Signed)
Visit Information   Patient Goals/Self-Care Activities: Patient will work with daughter to complete ADLs and IADLs Patient/daughter will complete application for Ensure assistance through [employer]  Daughter will return application to Dr Delynn Flavin nurse, Arlyss Repress Encouraged to reach out to [employer] as needed [phone] Patient will take all medications and supplements as prescribed Patient will keep all medical appointments Patient will monitor weight and report any weight loss Patient will eliminate alcohol use  Patient verbalizes understanding of instructions provided today and agrees to view in MyChart.   Plan:Telephone follow up appointment with care management team member scheduled for:  05/29/21 with RNCM The patient has been provided with contact information for the care management team and has been advised to call with any health related questions or concerns.   Betty Nelson, BSN, RN-BC Embedded Chronic Care Manager [facility] / [facility] Care Management Direct Dial: [phone]

## 2021-04-13 NOTE — Chronic Care Management (AMB) (Signed)
Chronic Care Management   CCM RN Visit Note  04/13/2021 Name: Betty Nelson MRN: [id number] DOB: October 26, 1960  Subjective: Betty Nelson is a 61 y.o. year old female who is a primary care patient of Janora Norlander, DO. The care management team was consulted for assistance with disease management and care coordination needs.    Engaged with patient's daughter, Betty Nelson,  for initial visit in response to provider referral for case management and/or care coordination services.   Consent to Services:  The patient was given information about Chronic Care Management services, agreed to services, and gave verbal consent prior to initiation of services.  Please see initial visit note for detailed documentation.   Patient agreed to services and verbal consent obtained.   Assessment: Review of patient past medical history, allergies, medications, health status, including review of consultants reports, laboratory and other test data, was performed as part of comprehensive evaluation and provision of chronic care management services.   SDOH (Social Determinants of Health) assessments and interventions performed:    CCM Care Plan  No Known Allergies  Outpatient Encounter Medications as of 04/13/2021  Medication Sig   feeding supplement, ENSURE COMPLETE, (ENSURE COMPLETE) LIQD Take 237 mLs by mouth 2 (two) times daily between meals. (Patient not taking: Reported on 12/27/2020)   fexofenadine (ALLEGRA) 180 MG tablet TAKE 1 TABLET BY MOUTH EVERY DAY   FLUoxetine (PROZAC) 40 MG capsule Take 1 capsule (40 mg total) by mouth daily.   folic acid (FOLVITE) 1 MG tablet Take 1 tablet (1 mg total) by mouth daily.   gabapentin (NEURONTIN) 600 MG tablet Take 1 tablet (600 mg total) by mouth at bedtime.   hydrOXYzine (ATARAX/VISTARIL) 25 MG tablet TAKE 1 TABLET (25 MG TOTAL) BY MOUTH EVERY 8 (EIGHT) HOURS AS NEEDED. FOR ANXIETY   levETIRAcetam (KEPPRA) 500 MG tablet Take 1 tablet (500 mg total) by mouth 2  (two) times daily. Please keep upcoming appt for continued refills   meloxicam (MOBIC) 7.5 MG tablet Take 1 tablet (7.5 mg total) by mouth daily. Arthritis   omeprazole (PRILOSEC) 20 MG capsule Take 1 capsule (20 mg total) by mouth daily. heartburn   ondansetron (ZOFRAN ODT) 4 MG disintegrating tablet Take 1 tablet (4 mg total) by mouth every 8 (eight) hours as needed for nausea or vomiting.   QUEtiapine (SEROQUEL) 25 MG tablet TAKE 1 TABLET BY MOUTH ONCE DAILY AT BEDTIME   tiZANidine (ZANAFLEX) 2 MG tablet Take 1 tablet (2 mg total) by mouth 2 (two) times daily as needed for muscle spasms.   traZODone (DESYREL) 150 MG tablet Take 0.5-1 tablets (75-150 mg total) by mouth at bedtime as needed for sleep.   No facility-administered encounter medications on file as of 04/13/2021.    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[place]) 10/05/2017   Alcohol abuse 10/05/2017   Encephalopathy    Allergic rhinitis 05/20/2017   Primary insomnia 11/18/2016   DDD (degenerative disc disease), lumbar 05/20/2016   Moderate episode of recurrent major depressive disorder ([place]) 05/20/2016   Essential hypertension 05/20/2016    Conditions to be addressed/monitored:Dementia, depression, alcohol dependence, malnutrition  Care Plan : [place]  Updates made by [facility],  Delice Bison, RN since 04/13/2021 12:00 AM     Problem: Chronic Disease Management Needs   Priority: High  Onset Date: 01/08/2021     Long-Range Goal: Patient/Daughter will Work with [facility] Regarding Care Management and Care Coordination Associated with Chronic Medical Conditions    Start Date: 01/08/2021  This Visit's Progress: On track  Recent Progress: On track  Priority: High  Note:   Current Barriers:  Care Coordination needs related to Financial constraints related to cost of nutritional supplements (Ensure), Mental Health Concerns , Substance abuse issues -  alcoholism, Memory Deficits, and Inability to perform IADL's independently  Chronic Disease Management support and education needs related to dementia, MDD, alcoholism, severe protein/calorie malnutrition   RNCM Clinical Goal(s):  Patient will continue to work with RN Care Manager to address care management and care coordination needs related to Depression, Dementia, and alcoholism and severe protein/calorie malnutrition  work with Education officer, museum to address [employer] Concerns  and Substance abuse issues -  alcohol related to the management of Depression, Dementia, and alcoholism work with Consulting civil engineer and PCP  to complete Application for Patient Assistance for [employer] through [employer]  through collaboration with Consulting civil engineer, provider, and care team.   Interventions: 1:1 collaboration with primary care provider regarding development and update of comprehensive plan of care as evidenced by provider attestation and co-signature Inter-disciplinary care team collaboration (see longitudinal plan of care) Evaluation of current treatment plan related to  self management and patient's adherence to plan as established by provider Discussed plans with patient/daughter for ongoing care management follow up and provided patient with direct contact information for care management team   Malnutrition r/t Decreased Appetite and Alcoholism:  (Status: Goal on track: YES.) Evaluation of current treatment plan related to Depression, Dementia, and malnutrition , ADL IADL limitations and Substance abuse issues -  alcohol  self-management and patient's adherence to plan as established by provider. Talked with  daughter, Betty Nelson, by telephone Assessed social determinant of health barriers;  Previously completed application for patient assistance for Ensure through [employer] Daughter isn't sure if she's has sent in their portion, but she will follow up on that Discussed that patient has had some increased nausea for the past few days but it is improving She does have zofran to use as needed Discussed family/social support Lives at home with her husband and adult daughter, Betty Nelson Primary care provided by Betty Nelson Previously discussed mobility and ADLs Patient is able to perform most ADLs with some assistance She does walk around inside the home but does not exercise and does not walk outside Discussed diet and appetite Decreased appetite but stable at this point  Has had Ensure/Boost in the past but they are too expensive to use regularly Assisted with patient assistance application for Ensure Discussed impact of beer intake on appetite Does drink some water but note enough per daughter Previously discussed supplements Patient is taking a multivitamin and folate Provided time for questions  Reviewed upcoming appointment with PCP and encouraged to reach out sooner if needed Provided with RN Care Manager contact number and encouraged to reach out as needed  Patient Goals/Self-Care Activities: Patient will work with daughter to complete ADLs and IADLs Patient/daughter will complete application for Ensure assistance through [place]  Daughter will return application to Dr Marjean Donna nurse, Yetta Flock Encouraged to reach out to [employer] as needed [phone] Patient will take all medications and supplements as prescribed Patient will keep all medical appointments Patient will  monitor weight and report any weight loss Patient will eliminate alcohol use  Plan:Telephone follow up appointment with care management team member scheduled for:  05/29/21 with RNCM The patient has been  provided with contact information for the care management team and has been advised to call with any health related questions or concerns.   Chong Sicilian, BSN, RN-BC Embedded Chronic Care Manager [facility] / [place] Management Direct Dial: [phone]

## 2021-04-20 ENCOUNTER — Other Ambulatory Visit: Payer: Self-pay | Admitting: Family Medicine

## 2021-04-20 DIAGNOSIS — M5136 Other intervertebral disc degeneration, lumbar region: Secondary | ICD-10-CM

## 2021-05-04 ENCOUNTER — Ambulatory Visit (INDEPENDENT_AMBULATORY_CARE_PROVIDER_SITE_OTHER): Payer: Medicare HMO | Admitting: Family Medicine

## 2021-05-04 ENCOUNTER — Encounter: Payer: Self-pay | Admitting: Family Medicine

## 2021-05-04 VITALS — BP 140/72 | HR 105 | Temp 97.1°F | Ht 65.0 in | Wt 102.0 lb

## 2021-05-04 DIAGNOSIS — R7989 Other specified abnormal findings of blood chemistry: Secondary | ICD-10-CM

## 2021-05-04 DIAGNOSIS — N289 Disorder of kidney and ureter, unspecified: Secondary | ICD-10-CM | POA: Diagnosis not present

## 2021-05-04 DIAGNOSIS — E440 Moderate protein-calorie malnutrition: Secondary | ICD-10-CM

## 2021-05-04 DIAGNOSIS — F1027 Alcohol dependence with alcohol-induced persisting dementia: Secondary | ICD-10-CM

## 2021-05-04 DIAGNOSIS — M5136 Other intervertebral disc degeneration, lumbar region: Secondary | ICD-10-CM | POA: Diagnosis not present

## 2021-05-04 DIAGNOSIS — F331 Major depressive disorder, recurrent, moderate: Secondary | ICD-10-CM

## 2021-05-04 DIAGNOSIS — F1094 Alcohol use, unspecified with alcohol-induced mood disorder: Secondary | ICD-10-CM

## 2021-05-04 DIAGNOSIS — R69 Illness, unspecified: Secondary | ICD-10-CM | POA: Diagnosis not present

## 2021-05-04 DIAGNOSIS — F1020 Alcohol dependence, uncomplicated: Secondary | ICD-10-CM

## 2021-05-04 LAB — CMP14+EGFR
ALT: 42 IU/L — ABNORMAL HIGH (ref 0–32)
AST: 64 IU/L — ABNORMAL HIGH (ref 0–40)
Albumin/Globulin Ratio: 1.6 (ref 1.2–2.2)
Albumin: 4.2 g/dL (ref 3.8–4.8)
Alkaline Phosphatase: 71 IU/L (ref 44–121)
BUN/Creatinine Ratio: 9 — ABNORMAL LOW (ref 12–28)
BUN: 8 mg/dL (ref 8–27)
Bilirubin Total: 0.2 mg/dL (ref 0.0–1.2)
CO2: 16 mmol/L — ABNORMAL LOW (ref 20–29)
Calcium: 9.3 mg/dL (ref 8.7–10.3)
Chloride: 104 mmol/L (ref 96–106)
Creatinine, Ser: 0.89 mg/dL (ref 0.57–1.00)
Globulin, Total: 2.6 g/dL (ref 1.5–4.5)
Glucose: 81 mg/dL (ref 70–99)
Potassium: 3.9 mmol/L (ref 3.5–5.2)
Sodium: 138 mmol/L (ref 134–144)
Total Protein: 6.8 g/dL (ref 6.0–8.5)
eGFR: 73 mL/min/{1.73_m2} (ref >59)

## 2021-05-04 MED ORDER — TRAZODONE HCL 150 MG PO TABS: 75.0000 mg | ORAL_TABLET | Freq: Every evening | ORAL | 1 refills | Status: DC | PRN

## 2021-05-04 MED ORDER — FLUOXETINE HCL 40 MG PO CAPS: 40.0000 mg | ORAL_CAPSULE | Freq: Every day | ORAL | 3 refills | Status: DC

## 2021-05-04 MED ORDER — TIZANIDINE HCL 2 MG PO TABS: 2.0000 mg | ORAL_TABLET | Freq: Two times a day (BID) | ORAL | 4 refills | Status: DC | PRN

## 2021-05-04 MED ORDER — GABAPENTIN 600 MG PO TABS: 600.0000 mg | ORAL_TABLET | Freq: Every day | ORAL | 1 refills | Status: DC

## 2021-05-04 NOTE — Progress Notes (Signed)
Subjective: CC: Follow-up mood disorder, alcoholism, dementia PCP: Janora Norlander, DO QZE:[id number] G Ludtke is a 61 y.o. female presenting to clinic today for:  1.  Mood disorder, alcoholism, dementia, malnourished Patient continues to drink alcohol regularly.  She admits to ongoing and intermittent flareups of anxiety despite use of Seroquel, trazodone, Prozac and gabapentin.  No seizure activity has been reported since her last visit.  She has not followed up with Dr. Brett Fairy yet.  Appetite continues to be poor.  Her daughter notes that they continue to give her Ensure.  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    Current Outpatient Medications:    feeding supplement, ENSURE COMPLETE, (ENSURE COMPLETE) LIQD, Take 237 mLs by mouth 2 (two) times daily between meals., Disp: 14220 mL, Rfl: prn   fexofenadine (ALLEGRA) 180 MG tablet, TAKE 1 TABLET BY MOUTH EVERY DAY, Disp: 90 tablet, Rfl: 2   FLUoxetine (PROZAC) 40 MG capsule, Take 1 capsule (40 mg total) by mouth daily., Disp: 90 capsule, Rfl: 3   folic acid (FOLVITE) 1 MG tablet, Take 1 tablet (1 mg total) by mouth daily., Disp: 90 tablet, Rfl: 3   gabapentin (NEURONTIN) 600 MG tablet, TAKE 1 TABLET BY MOUTH AT BEDTIME., Disp: 90 tablet, Rfl: 0   hydrOXYzine (ATARAX/VISTARIL) 25 MG tablet, TAKE 1 TABLET (25 MG TOTAL) BY MOUTH EVERY 8 (EIGHT) HOURS AS NEEDED. FOR ANXIETY, Disp: 270 tablet, Rfl: 1   levETIRAcetam (KEPPRA) 500 MG tablet, Take 1 tablet (500 mg  total) by mouth 2 (two) times daily. Please keep upcoming appt for continued refills, Disp: 180 tablet, Rfl: 0   meloxicam (MOBIC) 7.5 MG tablet, Take 1 tablet (7.5 mg total) by mouth daily. Arthritis, Disp: 90 tablet, Rfl: 3   omeprazole (PRILOSEC) 20 MG capsule, Take 1 capsule (20 mg total) by mouth daily. heartburn, Disp: 90 capsule, Rfl: 3   ondansetron (ZOFRAN ODT) 4 MG disintegrating tablet, Take 1 tablet (4 mg total) by mouth every 8 (eight) hours as needed for nausea or vomiting., Disp: 20 tablet, Rfl: 0   QUEtiapine (SEROQUEL) 25 MG tablet, TAKE 1 TABLET BY MOUTH ONCE DAILY AT BEDTIME, Disp: 90 tablet, Rfl: 1   tiZANidine (ZANAFLEX) 2 MG tablet, Take 1 tablet (2 mg total) by mouth 2 (two) times daily as needed for muscle spasms., Disp: 60 tablet, Rfl: 4   traZODone (DESYREL) 150 MG tablet, Take 0.5-1 tablets (75-150 mg total) by mouth at bedtime as needed for sleep., Disp: 90 tablet, Rfl: 1 Social History   Socioeconomic History   Marital status: Married    Spouse name: Not on file   Number of children: Not on file   Years of education: Not on file   Highest education level: Not on file  Occupational History   Not on file  Tobacco Use   Smoking status: Every Day    Packs/day: 0.33    Years: 43.00  Pack years: 14.19    Types: Cigarettes   Smokeless tobacco: Former    Types: Snuff  Vaping Use   Vaping Use: Never used  Substance and Sexual Activity   Alcohol use: Yes    Alcohol/week: 2.0 standard drinks    Types: 2 Cans of beer per week    Comment: 10/09/2017 "~ 2 beers/wk"   Drug use: No   Sexual activity: Not Currently  Other Topics Concern   Not on file  Social History Narrative   Not on file   Social Determinants of Health   Financial Resource Strain: Not on file  Food Insecurity: Not on file  Transportation Needs: No Transportation Needs   Lack of Transportation (Medical): No   Lack of Transportation (Non-Medical): No  Physical Activity: Inactive   Days of  Exercise per Week: 0 days   Minutes of Exercise per Session: 0 min  Stress: Not on file  Social Connections: Moderately Isolated   Frequency of Communication with Friends and Family: More than three times a week   Frequency of Social Gatherings with Friends and Family: More than three times a week   Attends Religious Services: Never   Marine scientist or Organizations: No   Attends Archivist Meetings: Never   Marital Status: Married  Human resources officer Violence: Not on file   Family History  Problem Relation Age of Onset   Heart disease Mother    Hyperlipidemia Mother    Heart disease Father    Hyperlipidemia Father     Objective: Office vital signs reviewed. BP 140/72    Pulse (!) 105    Temp (!) 97.1 F (36.2 C)    Ht _0  (1.651 m)    Wt 102 lb (46.3 kg)    SpO2 97%    BMI 16.97 kg/m   Physical Examination:  General: Awake, alert, malnourished, No acute distress HEENT: Mucous membranes mildly tacky Cardio: regular rate and rhythm, S1S2 heard, no murmurs appreciated Pulm: clear to auscultation bilaterally, no wheezes, rhonchi or rales; normal work of breathing on room air GI: soft, non-tender, non-distended, bowel sounds present x4, no hepatomegaly, no splenomegaly, no masses MSK: Ambulates with use of cane Psych: Mood is stable.  Patient seems to be intermittently distracted  Assessment/ Plan: 61 y.o. 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  She  is had elevated liver function test x2 now.  We will repeat this level along with recheck of her renal function though again this has been impaired x2 as well.  We discussed consideration for discontinuation of the NSAID given impaired renal function use of topical Voltaren instead for arthritis  We discussed at length the impact her alcoholism has on her body.  She is lost yet another 7 pounds since her last visit.  I encouraged high caloric diet, use of Ensure.  I do wonder if at some point we should consider addition of palliative medicine given decline.  She is on multiple medications for mood disorder.  These have been renewed  I have encouraged her to  follow-up with her neurologist for ongoing seizure meds as she should be running out soon  Orders Placed This Encounter  Procedures   CMP14+EGFR   No orders of the defined types were placed in this encounter.    Janora Norlander, DO [place] [phone]

## 2021-05-04 NOTE — Patient Instructions (Addendum)
YOU ARE OVERDUE for an appointment with Dr Maureen Chatters.  Please schedule ASAP, as your keppra will be running out soon.  High-Protein and High-Calorie Diet Eating high-protein and high-calorie foods can help you to gain weight, heal after an injury, and recover after an illness or surgery. The specific amount of daily protein and calories you need depends on: Your body weight. The reason this diet is recommended for you. Generally, a high-protein, high-calorie diet involves: Eating 250-500 extra calories each day. Making sure that you get enough of your daily calories from protein. Ask your health care provider how many of your calories should come from protein. Talk with a health care provider or a dietitian about how much protein and how many calories you need each day. Follow the diet as directed by your health care provider. What are tips for following this plan? Reading food labels Check the nutrition facts label for calories, grams of fat and protein. Items with more than 4 grams of protein are high-protein foods. Preparing meals Add whole milk, half-and-half, or heavy cream to cereal, pudding, soup, or hot cocoa. Add whole milk to instant breakfast drinks. Add peanut butter to oatmeal or smoothies. Add powdered milk to baked goods, smoothies, or milkshakes. Add powdered milk, cream, or butter to mashed potatoes. Add cheese to cooked vegetables. Make whole-milk yogurt parfaits. Top them with granola, fruit, or nuts. Add cottage cheese to fruit. Add avocado, cheese, or both to sandwiches or salads. Add avocado to smoothies. Add meat, poultry, or seafood to rice, pasta, casseroles, salads, and soups. Use mayonnaise when making egg salad, chicken salad, or tuna salad. Use peanut butter as a dip for fruits and vegetables or as a topping for pretzels, celery, or crackers. Add beans to casseroles, dips, and spreads. Add pureed beans to sauces and soups. Replace calorie-free drinks with  calorie-containing drinks, such as milk and fruit juice. Replace water with milk or heavy cream when making foods such as oatmeal, pudding, or cocoa. Add oil or butter to cooked vegetables and grains. Add cream cheese to sandwiches or as a topping on crackers and bread. Make cream-based pastas and soups. General information Ask your health care provider if you should take a nutritional supplement. Try to eat six small meals each day instead of three large meals. A general goal is to eat every 2 to 3 hours. Eat a balanced diet. In each meal, include one food that is high in protein and one food with fat in it. Keep nutritious snacks available, such as nuts, trail mixes, dried fruit, and yogurt. If you have kidney disease or diabetes, talk with your health care provider about how much protein is safe for you. Too much protein may put extra stress on your kidneys. Drink your calories. Choose high-calorie drinks and have them after your meals. Consider setting a timer to remind you to eat. You will want to eat even if you do not feel very hungry. What high-protein foods should I eat? Vegetables Soybeans. Peas. Grains Quinoa. Bulgur wheat. Buckwheat. Meats and other proteins Beef, pork, and poultry. Fish and seafood. Eggs. Tofu. Textured vegetable protein (TVP). Peanut butter. Nuts and seeds. Dried beans. Protein powders. Hummus. Dairy Whole milk. Whole-milk yogurt. Powdered milk. Cheese. [employer]. Eggnog. Beverages High-protein supplement drinks. Soy milk. Other foods Protein bars. The items listed above may not be a complete list of foods and beverages you can eat and drink. Contact a dietitian for more information. What high-calorie foods should I eat? Fruits Dried  fruit. Fruit leather. Canned fruit in syrup. Fruit juice. Avocado. Vegetables Vegetables cooked in oil or butter. Fried potatoes. Grains Pasta. Quick breads. Muffins. Pancakes. Ready-to-eat cereal. Meats and other  proteins Peanut butter. Nuts and seeds. Dairy Heavy cream. Whipped cream. Cream cheese. Sour cream. Ice cream. Custard. Pudding. Whole milk dairy products. Beverages Meal-replacement beverages. Nutrition shakes. Fruit juice. Seasonings and condiments Salad dressing. Mayonnaise. Alfredo sauce. Fruit preserves or jelly. Honey. Syrup. Sweets and desserts Cake. Cookies. Pie. Pastries. Candy bars. Chocolate. Fats and oils Butter or margarine. Oil. Gravy. Other foods Meal-replacement bars. The items listed above may not be a complete list of foods and beverages you can eat and drink. Contact a dietitian for more information. Summary A high-protein, high-calorie diet can help you gain weight or heal faster after an injury, illness, or surgery. To increase your protein and calories, add ingredients such as whole milk, peanut butter, cheese, beans, meat, or seafood to meal items. To get enough extra calories each day, include high-calorie foods and beverages at each meal. Adding a high-calorie drink or shake can be an easy way to help you get enough calories each day. Talk with your healthcare provider or dietitian about the best options for you. This information is not intended to replace advice given to you by your health care provider. Make sure you discuss any questions you have with your health care provider. Document Revised: 02/27/2020 Document Reviewed: 02/27/2020 Elsevier Patient Education  2022 [employer].

## 2021-05-08 DIAGNOSIS — F1027 Alcohol dependence with alcohol-induced persisting dementia: Secondary | ICD-10-CM

## 2021-05-08 DIAGNOSIS — F331 Major depressive disorder, recurrent, moderate: Secondary | ICD-10-CM

## 2021-05-09 ENCOUNTER — Other Ambulatory Visit: Payer: Self-pay | Admitting: Family Medicine

## 2021-05-09 DIAGNOSIS — F1020 Alcohol dependence, uncomplicated: Secondary | ICD-10-CM

## 2021-05-15 ENCOUNTER — Ambulatory Visit (INDEPENDENT_AMBULATORY_CARE_PROVIDER_SITE_OTHER): Payer: Medicare HMO | Admitting: Licensed Clinical Social Worker

## 2021-05-15 DIAGNOSIS — I10 Essential (primary) hypertension: Secondary | ICD-10-CM

## 2021-05-15 DIAGNOSIS — F331 Major depressive disorder, recurrent, moderate: Secondary | ICD-10-CM

## 2021-05-15 DIAGNOSIS — R569 Unspecified convulsions: Secondary | ICD-10-CM

## 2021-05-15 DIAGNOSIS — F1027 Alcohol dependence with alcohol-induced persisting dementia: Secondary | ICD-10-CM

## 2021-05-15 NOTE — Chronic Care Management (AMB) (Signed)
Chronic Care Management    Clinical Social Work Note  05/15/2021 Name: Betty Nelson MRN: [id number] DOB: September 11, 1960  Betty Nelson is a 61 y.o. year old female who is a primary care patient of Betty Norlander, DO. The CCM team was consulted to assist the patient with chronic disease management and/or care coordination needs related to: [employer] .   Engaged with patient / daughter of patient, Betty Nelson Fellows, by telephone for follow up visit in response to provider referral for social work chronic care management and care coordination services.   Consent to Services:  The patient was given information about Chronic Care Management services, agreed to services, and gave verbal consent prior to initiation of services.  Please see initial visit note for detailed documentation.   Patient agreed to services and consent obtained.   Assessment: Review of patient past medical history, allergies, medications, and health status, including review of relevant consultants reports was performed today as part of a comprehensive evaluation and provision of chronic care management and care coordination services.     SDOH (Social Determinants of Health) assessments and interventions performed:  SDOH Interventions    Flowsheet Row Most Recent Value  SDOH Interventions   Physical Activity Interventions Other (Comments)  [client has walking challenges. she use a cane to help her walk]  Depression Interventions/Treatment  Medication, Currently on Treatment        Advanced Directives Status: See Vynca application for related entries.  CCM Care Plan  No Known Allergies  Outpatient Encounter Medications as of 05/15/2021  Medication Sig   feeding supplement, ENSURE COMPLETE, (ENSURE COMPLETE) LIQD Take 237 mLs by mouth 2 (two) times daily between meals.   fexofenadine (ALLEGRA) 180 MG tablet TAKE 1 TABLET BY MOUTH EVERY DAY   FLUoxetine (PROZAC) 40 MG capsule Take 1 capsule (40 mg total) by  mouth daily.   folic acid (FOLVITE) 1 MG tablet TAKE 1 TABLET BY MOUTH EVERY DAY   gabapentin (NEURONTIN) 600 MG tablet Take 1 tablet (600 mg total) by mouth at bedtime.   hydrOXYzine (ATARAX/VISTARIL) 25 MG tablet TAKE 1 TABLET (25 MG TOTAL) BY MOUTH EVERY 8 (EIGHT) HOURS AS NEEDED. FOR ANXIETY   levETIRAcetam (KEPPRA) 500 MG tablet Take 1 tablet (500 mg total) by mouth 2 (two) times daily. Please keep upcoming appt for continued refills   omeprazole (PRILOSEC) 20 MG capsule Take 1 capsule (20 mg total) by mouth daily. heartburn   ondansetron (ZOFRAN ODT) 4 MG disintegrating tablet Take 1 tablet (4 mg total) by mouth every 8 (eight) hours as needed for nausea or vomiting.   QUEtiapine (SEROQUEL) 25 MG tablet TAKE 1 TABLET BY MOUTH ONCE DAILY AT BEDTIME   tiZANidine (ZANAFLEX) 2 MG tablet Take 1 tablet (2 mg total) by mouth 2 (two) times daily as needed for muscle spasms.   traZODone (DESYREL) 150 MG tablet Take 0.5-1 tablets (75-150 mg total) by mouth at bedtime as needed for sleep.   No facility-administered encounter medications on file as of 05/15/2021.    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 ([place]) 07/23/2019   Alcoholism with psychosis, with hallucinations ([place]) 07/23/2019   Wernicke's encephalopathy with cerebellar manifestation 07/23/2019   Seizures ([place]) 07/23/2019   Todd's paralysis ([place]) 10/05/2017   Alcohol abuse 10/05/2017   Encephalopathy    Allergic rhinitis 05/20/2017   Primary  insomnia 11/18/2016   DDD (degenerative disc disease), lumbar 05/20/2016   Moderate episode of recurrent major depressive disorder ([place]) 05/20/2016   Essential hypertension 05/20/2016    Conditions to be addressed/monitored: monitor client management of depression and depression issues  Care Plan : [place]  Updates  made by Katha Cabal, LCSW since 05/15/2021 12:00 AM     Problem: Depression Identification (Depression)      Goal: Depressive Symptoms Identified; Manage depression symptoms faced. Manage alcohol use weekly   Start Date: 05/15/2021  Expected End Date: 08/09/2021  This Visit's Progress: Not on track  Recent Progress: On track  Priority: Medium  Note:   Current Barriers:  Mental Health needs related to depression and depression management Challenges in Walking Alcohol use daily (2-3 beers per day) Some pain issues Suicidal Ideation/Homicidal Ideation: No  Clinical Social Work Goal(s):  patient will work with SW monthly by telephone or in person to reduce or manage symptoms related to depression Patient will communicate with RNCM monthly as needed for CCM nursing support Patient will attend scheduled medical appointments in next 30 days Patient will communicate with SW monthly to discuss client use of alcohol  Interventions: 1:1 collaboration with Betty Norlander, DO regarding development and update of comprehensive plan of care as evidenced by provider attestation and co-signature Discussed with Merwyn Katos, daughter of client, the current needs of client Discussed with Betty Nelson client use of medications.  Betty Nelson said client has prescribed medications and is taking medications as prescribed Discussed with Betty Nelson hallucinations of client. Betty Nelson feels that client is having reduced problems with hallucinations at this time.  Reviewed with Betty Nelson sleeping issues of client . Client has history of insomnia Reviewed with Amelia appetite of client.  Betty Nelson feels that client eats small meals, picks at her food for meals. However, client has had some weight loss. Dr. Lajuana Ripple has talked with client about how use of alcohol could affect her appetite.  Reviewed with Betty Nelson hearing needs of [facility]. Betty Nelson feels that client has some hearing loss.  Reviewed with Betty Nelson the current  mood status of client. Client is taking Prozac as prescribed.  Client is taking Seroquel as prescribed.  Betty Nelson feels that mood of client is stable Reviewed relaxation techniques of client (reads and enjoys crossword puzzles.) LCSW discussed with Betty Nelson client history of drinking beer daily. Betty Nelson said client has been doing this for a while. LCSW asked  Betty Nelson if she felt client saw this as a problem or wanted to stop this habit  Betty Nelson  was not sure if client saw drinking beer daily as a problem.  LCSW reminded Betty Nelson that Dr. Lajuana Ripple had talked with client and informed client that drinking in this way was not good for client's body and could affect client appetite (recent client weight loss) LCSW encouraged client or Betty Nelson to call RNCM as needed for CCM nursing support for client LCSW encouraged client or Betty Nelson to call SW as needed for SW support.  Provided counseling support for Amelia related to client needs  Patient Self Care Activities:  Completes ADLs Tries to attend scheduled medical appointments Takes medications as prescribed  Patient Coping Strengths:  Family Friends  Patient Self Care Deficits:  Mobility issues Alcohol use Dementia  Patient Goals:  - spend time or talk with others at least 2 to 3 times per week - practice relaxation or meditation daily - keep a calendar with appointment dates  Follow Up Plan: LCSW to call client or Betty Nelson  [place]  on 07/09/21 at 2:00 PM to assess client needs     Norva Riffle.Eldora Napp MSW, [place] Holiday representative [facility] Care Management [phone]

## 2021-05-15 NOTE — Patient Instructions (Addendum)
Visit Information  Patient Goals:  Manage Depression Symptoms faced. Manage Alcohol use weekly  Time Frame:  Short Term Goal Progress: Not On Track Priority:  Medium Start Date:  05/15/21 Expected end date:   08/09/21  Follow Up Date:   07/09/21 at  2:00 PM   Manage Depression Symptoms faced. Manage Alcohol use weekly  Patient Self Care Activities:  Completes ADLs Tries to attend scheduled medical appointments Takes medications as prescribed  Patient Coping Strengths:  Family Friends  Patient Self Care Deficits:  Mobility issues Alcohol use Dementia  Patient Goals:  - spend time or talk with others at least 2 to 3 times per week - practice relaxation or meditation daily - keep a calendar with appointment dates  Follow Up Plan: LCSW to call client or Amelia Martelli  on 07/09/21 at 2:00 PM to assess client needs  Kelton Pillar.Dilcia Rybarczyk MSW, LCSW Licensed Visual merchandiser [facility] Care Management [phone]

## 2021-05-23 ENCOUNTER — Other Ambulatory Visit: Payer: Self-pay | Admitting: Family

## 2021-05-23 DIAGNOSIS — F419 Anxiety disorder, unspecified: Secondary | ICD-10-CM

## 2021-05-23 NOTE — Telephone Encounter (Signed)
Last office visit 05/04/21 Last refill 10/12/20, #270, 1 refill

## 2021-05-29 ENCOUNTER — Telehealth: Payer: Medicare HMO | Admitting: *Deleted

## 2021-06-19 ENCOUNTER — Other Ambulatory Visit: Payer: Self-pay | Admitting: Family Medicine

## 2021-06-19 DIAGNOSIS — J301 Allergic rhinitis due to pollen: Secondary | ICD-10-CM

## 2021-06-23 ENCOUNTER — Other Ambulatory Visit: Payer: Self-pay | Admitting: Neurology

## 2021-06-23 ENCOUNTER — Other Ambulatory Visit: Payer: Self-pay | Admitting: Family Medicine

## 2021-06-23 DIAGNOSIS — F319 Bipolar disorder, unspecified: Secondary | ICD-10-CM

## 2021-07-09 ENCOUNTER — Ambulatory Visit (INDEPENDENT_AMBULATORY_CARE_PROVIDER_SITE_OTHER): Payer: Medicare HMO | Admitting: Licensed Clinical Social Worker

## 2021-07-09 DIAGNOSIS — F331 Major depressive disorder, recurrent, moderate: Secondary | ICD-10-CM

## 2021-07-09 DIAGNOSIS — F1027 Alcohol dependence with alcohol-induced persisting dementia: Secondary | ICD-10-CM

## 2021-07-09 DIAGNOSIS — F319 Bipolar disorder, unspecified: Secondary | ICD-10-CM

## 2021-07-09 DIAGNOSIS — R569 Unspecified convulsions: Secondary | ICD-10-CM

## 2021-07-09 DIAGNOSIS — I10 Essential (primary) hypertension: Secondary | ICD-10-CM

## 2021-07-09 NOTE — Chronic Care Management (AMB) (Signed)
?Chronic Care Management  ? ? Clinical Social Work Note ? ?07/09/2021 ?Name: Betty Nelson MRN: [id number] DOB: [birth year] ? ?Betty Nelson is a 61 y.o. year old female who is a primary care patient of [name], DO. The CCM team was consulted to assist the patient with chronic disease management and/or care coordination needs related to: [employer] .  ? ?Engaged with patient/ daughter of patient, Betty Nelson,  by telephone for follow up visit in response to provider referral for social work chronic care management and care coordination services.  ? ?Consent to Services:  ?The patient was given information about Chronic Care Management services, agreed to services, and gave verbal consent prior to initiation of services.  Please see initial visit note for detailed documentation.  ? ?Patient agreed to services and consent obtained.  ? ?Assessment: Review of patient past medical history, allergies, medications, and health status, including review of relevant consultants reports was performed today as part of a comprehensive evaluation and provision of chronic care management and care coordination services.    ? ?SDOH (Social Determinants of Health) assessments and interventions performed:  ?SDOH Interventions   ? ?Flowsheet Row Most Recent Value  ?SDOH Interventions   ?Physical Activity Interventions Other (Comments)  [walking challenges, uses a cane to help her walk]  ?Depression Interventions/Treatment  Medication  ? ?  ?  ? ?Advanced Directives Status: See Vynca application for related entries. ? ?CCM Care Plan ? ?No Known Allergies ? ?Outpatient Encounter Medications as of 07/09/2021  ?Medication Sig  ? feeding supplement, ENSURE COMPLETE, (ENSURE COMPLETE) LIQD Take 237 mLs by mouth 2 (two) times daily between meals.  ? fexofenadine (ALLEGRA) 180 MG tablet TAKE 1 TABLET BY MOUTH EVERY DAY  ? FLUoxetine (PROZAC) 40 MG capsule Take 1 capsule (40 mg total) by mouth daily.  ? folic acid (FOLVITE)  1 MG tablet TAKE 1 TABLET BY MOUTH EVERY DAY  ? gabapentin (NEURONTIN) 600 MG tablet Take 1 tablet (600 mg total) by mouth at bedtime.  ? hydrOXYzine (ATARAX) 25 MG tablet TAKE 1 TABLET (25 MG TOTAL) BY MOUTH EVERY 8 (EIGHT) HOURS AS NEEDED. FOR ANXIETY  ? levETIRAcetam (KEPPRA) 500 MG tablet Take 1 tablet (500 mg total) by mouth 2 (two) times daily. Please keep upcoming appt for continued refills  ? omeprazole (PRILOSEC) 20 MG capsule Take 1 capsule (20 mg total) by mouth daily. heartburn  ? ondansetron (ZOFRAN ODT) 4 MG disintegrating tablet Take 1 tablet (4 mg total) by mouth every 8 (eight) hours as needed for nausea or vomiting.  ? QUEtiapine (SEROQUEL) 25 MG tablet TAKE 1 TABLET BY MOUTH ONCE DAILY AT BEDTIME  ? tiZANidine (ZANAFLEX) 2 MG tablet Take 1 tablet (2 mg total) by mouth 2 (two) times daily as needed for muscle spasms.  ? traZODone (DESYREL) 150 MG tablet Take 0.5-1 tablets (75-150 mg total) by mouth at bedtime as needed for sleep.  ? ?No facility-administered encounter medications on file as of 07/09/2021.  ? ? ?Patient Active Problem List  ? Diagnosis Date Noted  ? Severe protein-calorie malnutrition [name]: less than 60% of standard weight) (HCC) 03/07/2020  ? 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 Alcoholism with psychosis, with hallucinations (H[facility]) 07/23/2019  ? Wernicke's encephalopathy with cerebellar manifestation 07/23/2019  ? Seizures ([facility]) 07/23/2019  ? Todd's paralysis (HCC) 10/05/2017  ? Alcohol abuse 10/05/2017  ? Encephalopathy   ? Allergic rhinitis 05/20/2017  ? Primary insomnia 11/18/2016  ? DDD (degenerative  disc disease), lumbar 05/20/2016  ? Moderate episode of recurrent major depressive disorder (HCC) 05/20/2016  ? Essential hypertension 05/20/2016  ? ? ?Conditions to be addressed/monitored: monitor client management of depression issues; monitor client use of alcohol weekly ? ?Care Plan : LCSW Care Plan  ?Updates made by Isaiah Blakes, LCSW since 07/09/2021 12:00 AM  ?  ? ?Problem: Depression Identification (Depression)   ?  ? ?Goal: Depressive Symptoms Identified; Manage depression symptoms faced. Manage alcohol use weekly   ?Start Date: 07/09/2021  ?Expected End Date: 10/08/2021  ?This Visit's Progress: Not on track  ?Recent Progress: Not on track  ?Priority: Medium  ?Note:   ?Current Barriers:  ?Mental Health needs related to depression and depression management ?Challenges in Walking ?Alcohol use daily (2-3 beers per day) ?Some pain issues ?Suicidal Ideation/Homicidal Ideation: No ? ?Clinical Social Work Goal(s):  ?patient will work with SW monthly by telephone or in person to reduce or manage symptoms related to depression ?Patient will communicate with RNCM monthly as needed for CCM nursing support ?Patient will attend scheduled medical appointments in next 30 days ?Patient will communicate with SW monthly to discuss client use of alcohol ? ?Interventions: ?1:1 collaboration with [name], DO regarding development and update of comprehensive plan of care as evidenced by provider attestation and co-signature ?Discussed with Betty Nelson, daughter of client, the current needs of client ?Discussed with Betty Poag client use of medications.  Betty Poag said client has prescribed medications and is taking medications as prescribed ?Discussed with Amelia hallucinations of client. Betty Poag said she has not noticed any hallucinations recently with client ?Reviewed sleeping issues of client. Reviewed appetite of client. Betty Poag said client  is eating small meals daily . Betty Poag said client is drinking 2 cans of Ensure daily ?Discussed client use of alcohol. Betty Poag said client is drinking about 2 cans of beer daily ?Discussed client support with neurologist ?Discussed client mood. Betty Poag said she thought that mood of client was stable. Client is taking medications as prescribed and has family support from her daughter, Betty Poag and from spouse of  client ?Reviewed with Betty Poag hearing needs of [facility]. Betty Poag feels that client has some hearing loss. Client is not using a hearing aid ?Reviewed relaxation techniques of client (reads and enjoys crossword puzzles.) ?LCSW encouraged client or Betty Poag to call RNCM as needed for CCM nursing support for client ?LCSW encouraged client or Betty Poag to call SW as needed for SW support.  ?Discussed socialization of client. While client does socialize with her daughter and her spouse, client does not leave home often. She does not walk outside often.  Thus client may be isolating socially from others outside of her family. She continues pattern of drinking a few beers daily though PCP has talked with her about negative affects on her body and on her appetite.   ? ?Patient Self Care Activities:  ?Completes ADLs ?Tries to attend scheduled medical appointments ?Takes medications as prescribed ? ?Patient Coping Strengths:  ?Family ?Friends ? ?Patient Self Care Deficits:  ?Mobility issues ?Alcohol use ?Dementia ? ?Patient Goals:  ?- spend time or talk with others at least 2 to 3 times per week ?- practice relaxation or meditation daily ?- keep a calendar with appointment dates ? ?Follow Up Plan: LCSW to call client or Amelia Fehrman  on 08/30/21 at 2:00 PM to assess client needs ?  ?  ?Kelton Pillar.Kenan Moodie MSW, LCSW ?Licensed Clinical Social Worker ?[facility] Care Management ?[phone] ?

## 2021-07-09 NOTE — Patient Instructions (Addendum)
Visit Information ? ?Patient Goals:  Manage Depression Symptoms faced. Manage Alcohol use weekly ? ?Goal:  Short Term Goal ?Progress: Not On Track ?Priority:  Medium ?Start Date:  07/09/21 ?Expected end date:  10/08/21 ? ?Follow Up Date:   08/30/21 at 2:00 PM  ? ?Manage Depression Symptoms faced. Manage Alcohol use weekly ? ?Patient Self Care Activities:  ?Completes ADLs ?Tries to attend scheduled medical appointments ?Takes medications as prescribed ? ?Patient Coping Strengths:  ?Family ?Friends ? ?Patient Self Care Deficits:  ?Mobility issues ?Alcohol use ?Dementia ? ?Patient Goals:  ?- spend time or talk with others at least 2 to 3 times per week ?- practice relaxation or meditation daily ?- keep a calendar with appointment dates ? ?Follow Up Plan: LCSW to call client or Amelia Sousa  on 08/30/21 at 2:00 PM to assess client needs ? ?Kelton Pillar.Donnivan Villena MSW, LCSW ?Licensed Clinical Social Worker ?[facility] Care Management ?[phone] ?

## 2021-08-05 DIAGNOSIS — F1027 Alcohol dependence with alcohol-induced persisting dementia: Secondary | ICD-10-CM

## 2021-08-05 DIAGNOSIS — F331 Major depressive disorder, recurrent, moderate: Secondary | ICD-10-CM

## 2021-08-05 DIAGNOSIS — I10 Essential (primary) hypertension: Secondary | ICD-10-CM

## 2021-08-21 ENCOUNTER — Ambulatory Visit: Payer: Medicare HMO | Admitting: Family Medicine

## 2021-08-28 ENCOUNTER — Ambulatory Visit (INDEPENDENT_AMBULATORY_CARE_PROVIDER_SITE_OTHER): Payer: Medicare HMO | Admitting: Family Medicine

## 2021-08-28 ENCOUNTER — Encounter: Payer: Self-pay | Admitting: Family Medicine

## 2021-08-28 VITALS — BP 109/59 | HR 100 | Temp 97.8°F | Ht 65.0 in | Wt 94.4 lb

## 2021-08-28 DIAGNOSIS — R627 Adult failure to thrive: Secondary | ICD-10-CM

## 2021-08-28 DIAGNOSIS — E440 Moderate protein-calorie malnutrition: Secondary | ICD-10-CM

## 2021-08-28 DIAGNOSIS — F319 Bipolar disorder, unspecified: Secondary | ICD-10-CM

## 2021-08-28 DIAGNOSIS — F1094 Alcohol use, unspecified with alcohol-induced mood disorder: Secondary | ICD-10-CM | POA: Diagnosis not present

## 2021-08-28 DIAGNOSIS — R569 Unspecified convulsions: Secondary | ICD-10-CM

## 2021-08-28 DIAGNOSIS — F1027 Alcohol dependence with alcohol-induced persisting dementia: Secondary | ICD-10-CM

## 2021-08-28 DIAGNOSIS — I10 Essential (primary) hypertension: Secondary | ICD-10-CM

## 2021-08-28 DIAGNOSIS — R69 Illness, unspecified: Secondary | ICD-10-CM | POA: Diagnosis not present

## 2021-08-28 MED ORDER — QUETIAPINE FUMARATE 25 MG PO TABS: 25.0000 mg | ORAL_TABLET | Freq: Every day | ORAL | 1 refills | Status: DC

## 2021-08-28 NOTE — Progress Notes (Signed)
Subjective: CC: Chronic follow-up PCP: Janora Norlander, DO EQA:STMHDQ G Bakula is a 61 y.o. female who is brought to the office by her daughter.  She is presenting to clinic today for:  1.  Mood disorder Patient continues to drink [place]'s best fairly regularly.  Her daughter has been supplementing her diet with ensures roughly 3 times per day.  Unfortunately this is not covered by insurance and is quite expensive.  Patient is asking for something to "increase her appetite" and help with her anxiety.  She is currently treated with Prozac 40 mg daily, Atarax 25 mg twice daily, Seroquel 25 mg daily, trazodone 150 mg nightly and gabapentin 600 mg at night.  She has been lost to follow-up with her neurologist because she "does not want to go".  She subsequently has not been on her Keppra.  No reports of seizure activity.  She continues to consume large amounts of alcohol.  She does not have an appetite as a result.  She has been treated with multivitamin and folic acid.   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    Current Outpatient Medications:    feeding supplement, ENSURE COMPLETE, (ENSURE COMPLETE) LIQD, Take 237 mLs by mouth 2 (two) times daily between meals., Disp: 14220 mL, Rfl: prn   fexofenadine (ALLEGRA) 180 MG tablet, TAKE 1 TABLET BY MOUTH EVERY DAY, Disp: 90 tablet, Rfl: 2   FLUoxetine (PROZAC) 40 MG capsule, Take 1 capsule (40 mg total) by mouth daily., Disp: 90 capsule, Rfl: 3   folic acid (FOLVITE) 1 MG tablet, TAKE 1 TABLET BY MOUTH EVERY DAY, Disp: 90  tablet, Rfl: 3   gabapentin (NEURONTIN) 600 MG tablet, Take 1 tablet (600 mg total) by mouth at bedtime., Disp: 90 tablet, Rfl: 1   hydrOXYzine (ATARAX) 25 MG tablet, TAKE 1 TABLET (25 MG TOTAL) BY MOUTH EVERY 8 (EIGHT) HOURS AS NEEDED. FOR ANXIETY, Disp: 270 tablet, Rfl: 1   omeprazole (PRILOSEC) 20 MG capsule, Take 1 capsule (20 mg total) by mouth daily. heartburn, Disp: 90 capsule, Rfl: 3   ondansetron (ZOFRAN ODT) 4 MG disintegrating tablet, Take 1 tablet (4 mg total) by mouth every 8 (eight) hours as needed for nausea or vomiting., Disp: 20 tablet, Rfl: 0   QUEtiapine (SEROQUEL) 25 MG tablet, TAKE 1 TABLET BY MOUTH ONCE DAILY AT BEDTIME, Disp: 90 tablet, Rfl: 1   tiZANidine (ZANAFLEX) 2 MG tablet, Take 1 tablet (2 mg total) by mouth 2 (two) times daily as needed for muscle spasms., Disp: 60 tablet, Rfl: 4   traZODone (DESYREL) 150 MG tablet, Take 0.5-1 tablets (75-150 mg total) by mouth at bedtime as needed for sleep., Disp: 90 tablet, Rfl: 1   levETIRAcetam (KEPPRA) 500 MG tablet, Take 1 tablet (500 mg total) by mouth 2 (two) times daily. Please keep upcoming appt for continued refills (Patient not taking: Reported on 08/28/2021), Disp: 180 tablet, Rfl: 0 Social History   Socioeconomic History   Marital status: Married  Spouse name: Not on file   Number of children: Not on file   Years of education: Not on file   Highest education level: Not on file  Occupational History   Not on file  Tobacco Use   Smoking status: Every Day    Packs/day: 0.33    Years: 43.00    Pack years: 14.19    Types: Cigarettes   Smokeless tobacco: Former    Types: Snuff  Vaping Use   Vaping Use: Never used  Substance and Sexual Activity   Alcohol use: Yes    Alcohol/week: 2.0 standard drinks    Types: 2 Cans of beer per week    Comment: 10/09/2017 "~ 2 beers/wk"   Drug use: No   Sexual activity: Not Currently  Other Topics Concern   Not on file  Social History Narrative   Not on file   Social  Determinants of Health   Financial Resource Strain: Not on file  Food Insecurity: Not on file  Transportation Needs: No Transportation Needs   Lack of Transportation (Medical): No   Lack of Transportation (Non-Medical): No  Physical Activity: Inactive   Days of Exercise per Week: 0 days   Minutes of Exercise per Session: 0 min  Stress: Not on file  Social Connections: Moderately Isolated   Frequency of Communication with Friends and Family: More than three times a week   Frequency of Social Gatherings with Friends and Family: More than three times a week   Attends Religious Services: Never   Marine scientist or Organizations: No   Attends Archivist Meetings: Never   Marital Status: Married  Human resources officer Violence: Not on file   Family History  Problem Relation Age of Onset   Heart disease Mother    Hyperlipidemia Mother    Heart disease Father    Hyperlipidemia Father     Objective: Office vital signs reviewed. BP (!) 109/59   Pulse 100   Temp 97.8 F (36.6 C)   Ht 5' 5" (1.651 m)   Wt 94 lb 6.4 oz (42.8 kg)   SpO2 100%   BMI 15.71 kg/m   Physical Examination:  General: Awake, alert, malnourished, poorly kempt, No acute distress HEENT. No teeth. Cardio: regular rate and rhythm, S1S2 heard, no murmurs appreciated Pulm: clear to auscultation bilaterally, no wheezes, rhonchi or rales; normal work of breathing on room air GI: soft, non-tender, non-distended, bowel sounds present x4, no hepatomegaly, no splenomegaly, no masses Neuro: frequently requires redirection, repeating of instruction/ recommendations.  Psych: picking at her clothing, looking around the room  Assessment/ Plan: 61 y.o. 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  I again reiterated at length with the patient today that gradual reduction and ultimate cessation of alcohol would improve many of her issues including anxiety.  She is on 5 medications which affect mood so I am hesitant  to add any other medications at this time.  I think that working on fixing the underlying issue, alcoholism, would be more helpful than adding on medications to suppress the repercussions.  Weight has come down from 102 pounds to 94 pounds today.  BMI consistently getting worse at less than 16 now.  For this reason I have recommended palliative care referral for further assistance with this patient.  Highly recommended that she follow-up with her neurologist for ongoing management of seizure disorder we discussed that this could be life-threatening, particular given ongoing alcohol use.  No orders of the defined types were placed in this encounter.  No orders of the defined types were placed in this encounter.    Janora Norlander, DO [place] [phone]

## 2021-08-28 NOTE — Patient Instructions (Signed)
Carnation instant breakfast. Add avocado/ peanut butter to increase the calories in it. Referral to palliative to help [place] placed Make an appointment with the neurologist for seizure meds

## 2021-08-29 LAB — CMP14+EGFR
ALT: 24 IU/L (ref 0–32)
AST: 51 IU/L — ABNORMAL HIGH (ref 0–40)
Albumin/Globulin Ratio: 1.8 (ref 1.2–2.2)
Albumin: 4.6 g/dL (ref 3.8–4.8)
Alkaline Phosphatase: 58 IU/L (ref 44–121)
BUN/Creatinine Ratio: 14 (ref 12–28)
BUN: 11 mg/dL (ref 8–27)
Bilirubin Total: 0.3 mg/dL (ref 0.0–1.2)
CO2: 15 mmol/L — ABNORMAL LOW (ref 20–29)
Calcium: 9.1 mg/dL (ref 8.7–10.3)
Chloride: 100 mmol/L (ref 96–106)
Creatinine, Ser: 0.79 mg/dL (ref 0.57–1.00)
Globulin, Total: 2.5 g/dL (ref 1.5–4.5)
Glucose: 57 mg/dL — ABNORMAL LOW (ref 70–99)
Potassium: 4.1 mmol/L (ref 3.5–5.2)
Sodium: 135 mmol/L (ref 134–144)
Total Protein: 7.1 g/dL (ref 6.0–8.5)
eGFR: 84 mL/min/{1.73_m2} (ref >59)

## 2021-08-29 LAB — CBC
Hematocrit: 36.8 % (ref 34.0–46.6)
Hemoglobin: 12 g/dL (ref 11.1–15.9)
MCH: 29.7 pg (ref 26.6–33.0)
MCHC: 32.6 g/dL (ref 31.5–35.7)
MCV: 91 fL (ref 79–97)
Platelets: 317 10*3/uL (ref 150–450)
RBC: 4.04 x10E6/uL (ref 3.77–5.28)
RDW: 14.4 % (ref 11.7–15.4)
WBC: 6.1 10*3/uL (ref 3.4–10.8)

## 2021-08-30 ENCOUNTER — Ambulatory Visit (INDEPENDENT_AMBULATORY_CARE_PROVIDER_SITE_OTHER): Payer: Medicare HMO | Admitting: Licensed Clinical Social Worker

## 2021-08-30 DIAGNOSIS — F1027 Alcohol dependence with alcohol-induced persisting dementia: Secondary | ICD-10-CM

## 2021-08-30 DIAGNOSIS — R569 Unspecified convulsions: Secondary | ICD-10-CM

## 2021-08-30 DIAGNOSIS — F331 Major depressive disorder, recurrent, moderate: Secondary | ICD-10-CM

## 2021-08-30 DIAGNOSIS — F319 Bipolar disorder, unspecified: Secondary | ICD-10-CM

## 2021-08-30 DIAGNOSIS — I10 Essential (primary) hypertension: Secondary | ICD-10-CM

## 2021-08-30 NOTE — Chronic Care Management (AMB) (Signed)
Chronic Care Management    Clinical Social Work Note  08/30/2021 Name: Betty Nelson MRN: [id number] DOB: 1960/10/08  Betty Nelson is a 61 y.o. year old female who is a primary care patient of [name], DO. The CCM team was consulted to assist the patient with chronic disease management and/or care coordination needs related to: [employer] .   Engaged with patient / daughter of patient, Betty Nelson, by telephone for follow up visit in response to provider referral for social work chronic care management and care coordination services.   Consent to Services:  The patient was given information about Chronic Care Management services, agreed to services, and gave verbal consent prior to initiation of services.  Please see initial visit note for detailed documentation.   Patient agreed to services and consent obtained.   Assessment: Review of patient past medical history, allergies, medications, and health status, including review of relevant consultants reports was performed today as part of a comprehensive evaluation and provision of chronic care management and care coordination services.     SDOH (Social Determinants of Health) assessments and interventions performed:  SDOH Interventions    Flowsheet Row Most Recent Value  SDOH Interventions   Physical Activity Interventions Other (Comments)  [walking challenges. sometimes uses a cane to help her walk]  Alcohol Brief Interventions/Follow-up --  [name] and MD have talked about client alcohol use. client has received information from MD about affects of alcohol use on her body]  Depression Interventions/Treatment  Counseling, Medication        Advanced Directives Status: See Vynca application for related entries.  CCM Care Plan  No Known Allergies  Outpatient Encounter Medications as of 08/30/2021  Medication Sig   feeding supplement, ENSURE COMPLETE, (ENSURE COMPLETE) LIQD Take 237 mLs by mouth 2 (two)  times daily between meals.   fexofenadine (ALLEGRA) 180 MG tablet TAKE 1 TABLET BY MOUTH EVERY DAY   FLUoxetine (PROZAC) 40 MG capsule Take 1 capsule (40 mg total) by mouth daily.   folic acid (FOLVITE) 1 MG tablet TAKE 1 TABLET BY MOUTH EVERY DAY   gabapentin (NEURONTIN) 600 MG tablet Take 1 tablet (600 mg total) by mouth at bedtime.   hydrOXYzine (ATARAX) 25 MG tablet TAKE 1 TABLET (25 MG TOTAL) BY MOUTH EVERY 8 (EIGHT) HOURS AS NEEDED. FOR ANXIETY   levETIRAcetam (KEPPRA) 500 MG tablet Take 1 tablet (500 mg total) by mouth 2 (two) times daily. Please keep upcoming appt for continued refills (Patient not taking: Reported on 08/28/2021)   omeprazole (PRILOSEC) 20 MG capsule Take 1 capsule (20 mg total) by mouth daily. heartburn   ondansetron (ZOFRAN ODT) 4 MG disintegrating tablet Take 1 tablet (4 mg total) by mouth every 8 (eight) hours as needed for nausea or vomiting.   QUEtiapine (SEROQUEL) 25 MG tablet Take 1 tablet (25 mg total) by mouth at bedtime.   tiZANidine (ZANAFLEX) 2 MG tablet Take 1 tablet (2 mg total) by mouth 2 (two) times daily as needed for muscle spasms.   traZODone (DESYREL) 150 MG tablet Take 0.5-1 tablets (75-150 mg total) by mouth at bedtime as needed for sleep.   No facility-administered encounter medications on file as of 08/30/2021.    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 Monitor client use of alcohol .  Care Plan : LCSW Care Plan  Updates made by [name], [name], LCSW since 08/30/2021 12:00 AM     Problem: Depression Identification (Depression)      Goal: Depressive Symptoms Identified; Manage depression symptoms faced. Manage alcohol use weekly   Start Date: 07/09/2021  Expected End Date: 11/29/2021  This Visit's Progress: Not on track  Recent Progress: Not on track  Priority: Medium  Note:   Current Barriers:  Mental Health needs related to depression and depression management Challenges in Walking Alcohol use daily (2-3 beers per day) Some pain issues Suicidal Ideation/Homicidal Ideation: No  Clinical Social Work Goal(s):  patient will work with SW monthly by telephone or in person to reduce or manage symptoms related to depression Patient will communicate with RNCM monthly as needed for CCM nursing support Patient will attend scheduled medical appointments in next 30 days Patient will communicate with SW monthly to discuss client use of alcohol  Interventions: 1:1 collaboration with Raliegh IpGottschalk, [name], DO regarding development and update of comprehensive plan of care as evidenced by provider attestation and co-signature Discussed with Betty PoagAmelia Lavery, daughter of client, the current needs of client Discussed with Betty PoagAmelia client use of medications.  Betty Poagmelia said client has prescribed medications and is taking medications as prescribed Discussed with Betty Poagmelia hallucinations of client. Betty Poagmelia said she has not noticed any hallucinations recently with client Reviewed sleeping issues of client. Reviewed appetite of client. Betty Poagmelia said client  is eating small meals daily . Betty Poagmelia said client is drinking 1 can of Ensure daily Discussed client use of alcohol. Betty Poagmelia said client is drinking about 2 cans of beer  daily Discussed client mood. Betty Poagmelia said she thought that mood of client was stable. Client is taking medications as prescribed and has family support from her daughter, Betty Poagmelia and from spouse of client Reviewed relaxation techniques of client (reads and enjoys crossword puzzles.) LCSW encouraged client or Betty Poagmelia to call RNCM as needed for CCM nursing support for client Discussed socialization of client. While client does socialize with her daughter and her spouse, client does not leave home often. She does not walk outside often.  Thus client may be isolating socially from others outside of her family. She continues pattern of drinking a few beers daily though PCP has talked with her about negative affects on her body and on her appetite.   Reviewed ambulation of client. Client occasionally  uses a cane to help her walk Regarding Palliative Care referral by PCP , Betty PoagAmelia Marlar said no one from Palliative Care has contacted client yet to discuss Palliative Care support  Patient Self Care Activities:  Completes ADLs Tries to attend scheduled medical appointments Takes medications as prescribed  Patient Coping Strengths:  Family Friends  Patient Self Care Deficits:  Mobility issues Alcohol use Dementia  Patient Goals:  - spend time or talk with others at least 2 to 3 times per week - practice relaxation or meditation daily - keep a calendar with appointment dates  Follow Up Plan: LCSW to call client or MillwoodAmelia Kolasa  on 10/03/21 at 2:00 PM to assess client needs     Kelton Pillar.Efraim Vanallen MSW, LCSW Licensed Visual merchandiser [facility] Care Management [phone]

## 2021-08-30 NOTE — Patient Instructions (Addendum)
Visit Information  Patient goals  Manage Depression symptoms faced . Manage Alcohol use weekly  Goal:  Short Term Goal Progress: Not On Track Priority:  Medium Start Date:  07/09/21 Expected end date:  11/29/21    Follow Up Date:   10/03/21 at 2:00 PM   Manage Depression Symptoms faced. Manage Alcohol use weekly  Patient Self Care Activities:  Completes ADLs Tries to attend scheduled medical appointments Takes medications as prescribed  Patient Coping Strengths:  Family Friends  Patient Self Care Deficits:  Mobility issues Alcohol use Dementia  Patient Goals:  - spend time or talk with others at least 2 to 3 times per week - practice relaxation or meditation daily - keep a calendar with appointment dates  Follow Up Plan: LCSW to call client or Betty Nelson  on 10/03/21 at 2:00 PM to assess client needs  Betty Nelson.Betty Nelson MSW, LCSW Licensed Visual merchandiser [facility] Care Management [phone]

## 2021-09-05 DIAGNOSIS — F331 Major depressive disorder, recurrent, moderate: Secondary | ICD-10-CM

## 2021-09-05 DIAGNOSIS — I10 Essential (primary) hypertension: Secondary | ICD-10-CM

## 2021-09-05 DIAGNOSIS — F1027 Alcohol dependence with alcohol-induced persisting dementia: Secondary | ICD-10-CM

## 2021-09-11 ENCOUNTER — Other Ambulatory Visit: Payer: Self-pay | Admitting: Family Medicine

## 2021-09-11 DIAGNOSIS — M5136 Other intervertebral disc degeneration, lumbar region: Secondary | ICD-10-CM

## 2021-09-26 ENCOUNTER — Other Ambulatory Visit: Payer: Self-pay | Admitting: Family Medicine

## 2021-09-26 DIAGNOSIS — M5136 Other intervertebral disc degeneration, lumbar region: Secondary | ICD-10-CM

## 2021-09-28 DIAGNOSIS — E43 Unspecified severe protein-calorie malnutrition: Secondary | ICD-10-CM | POA: Diagnosis not present

## 2021-09-28 DIAGNOSIS — Z681 Body mass index (BMI) 19 or less, adult: Secondary | ICD-10-CM | POA: Diagnosis not present

## 2021-09-28 DIAGNOSIS — Z515 Encounter for palliative care: Secondary | ICD-10-CM | POA: Diagnosis not present

## 2021-09-28 DIAGNOSIS — R69 Illness, unspecified: Secondary | ICD-10-CM | POA: Diagnosis not present

## 2021-10-01 ENCOUNTER — Telehealth: Payer: Self-pay | Admitting: *Deleted

## 2021-10-01 NOTE — Telephone Encounter (Signed)
FYI _   call from [facility] [place] [facility]  She done her assessment today and has finished her note and we can watch for a fax of all info. She just wanted to cal [place] and update what she seen:   Pt does have Suicidal ideations: no intent, no plan  Pos for alcohol use Daughter was surprised at her SI  Did have an attempt 25 years ago   States ideas like " I would jump off a bridge"  Does not leave the house and does not have access to her medications.   She is concerned of Imagination VS. Reality with her bi-polar.   They would like a referral to Psych.   (Do not see referral for last 3 yrs)

## 2021-10-02 ENCOUNTER — Other Ambulatory Visit: Payer: Self-pay | Admitting: Family Medicine

## 2021-10-02 DIAGNOSIS — F319 Bipolar disorder, unspecified: Secondary | ICD-10-CM

## 2021-10-02 DIAGNOSIS — F1094 Alcohol use, unspecified with alcohol-induced mood disorder: Secondary | ICD-10-CM

## 2021-10-03 ENCOUNTER — Telehealth: Payer: Medicare HMO

## 2021-10-03 ENCOUNTER — Ambulatory Visit (INDEPENDENT_AMBULATORY_CARE_PROVIDER_SITE_OTHER): Payer: Medicare HMO | Admitting: Licensed Clinical Social Worker

## 2021-10-03 DIAGNOSIS — R569 Unspecified convulsions: Secondary | ICD-10-CM

## 2021-10-03 DIAGNOSIS — F319 Bipolar disorder, unspecified: Secondary | ICD-10-CM

## 2021-10-03 DIAGNOSIS — F331 Major depressive disorder, recurrent, moderate: Secondary | ICD-10-CM

## 2021-10-03 DIAGNOSIS — F1027 Alcohol dependence with alcohol-induced persisting dementia: Secondary | ICD-10-CM

## 2021-10-03 DIAGNOSIS — I10 Essential (primary) hypertension: Secondary | ICD-10-CM

## 2021-10-03 NOTE — Telephone Encounter (Signed)
Spoke with [place] [facility] they are going to inform Trula Ore that referral was placed.

## 2021-10-03 NOTE — Patient Instructions (Addendum)
Visit Information  Patient Goals:   Manage Depression Symptoms faced. Manage Alcohol use weekly  Goal:  Short Term Goal Progress: Not On Track Priority:  Medium Start Date:  07/09/21 Expected end date:  12/31/21        Follow Up Date:   11/07/21 at 3:00 PM   Manage Depression Symptoms faced. Manage Alcohol use weekly  Patient Self Care Activities:  Completes ADLs Tries to attend scheduled medical appointments Takes medications as prescribed  Patient Coping Strengths:  Family Friends  Patient Self Care Deficits:  Mobility issues Alcohol use Dementia  Patient Goals:  - spend time or talk with others at least 2 to 3 times per week - practice relaxation or meditation daily - keep a calendar with appointment dates  Follow Up Plan: LCSW to call client or Amelia Crocket  on 11/07/21 at 3:00 PM   Kelton Pillar.Carmello Cabiness MSW, LCSW Licensed Visual merchandiser [facility] Care Management [phone]

## 2021-10-03 NOTE — Chronic Care Management (AMB) (Signed)
Chronic Care Management    Clinical Social Work Note  10/03/2021 Name: Betty Nelson MRN: [id number] DOB: 1960/05/15  Betty Nelson is a 61 y.o. year old female who is a primary care patient of Betty Ip, DO. The CCM team was consulted to assist the patient with chronic disease management and/or care coordination needs related to: [employer] .   Engaged with patient/ daughter of patient, Betty Nelson, by telephone for follow up visit in response to provider referral for social work chronic care management and care coordination services.   Consent to Services:  The patient was given information about Chronic Care Management services, agreed to services, and gave verbal consent prior to initiation of services.  Please see initial visit note for detailed documentation.   Patient agreed to services and consent obtained.   Assessment: Review of patient past medical history, allergies, medications, and health status, including review of relevant consultants reports was performed today as part of a comprehensive evaluation and provision of chronic care management and care coordination services.     SDOH (Social Determinants of Health) assessments and interventions performed:  SDOH Interventions    Flowsheet Row Most Recent Value  SDOH Interventions   Physical Activity Interventions Other (Comments)  [walking challenges. she uses a cane to help her walk]  Stress Interventions Other (Comment)  [client has stress related to managing medical needs]  Depression Interventions/Treatment  Medication, Counseling        Advanced Directives Status: See Vynca application for related entries.  CCM Care Plan  No Known Allergies  Outpatient Encounter Medications as of 10/03/2021  Medication Sig   feeding supplement, ENSURE COMPLETE, (ENSURE COMPLETE) LIQD Take 237 mLs by mouth 2 (two) times daily between meals.   fexofenadine (ALLEGRA) 180 MG tablet TAKE 1 TABLET BY MOUTH EVERY  DAY   FLUoxetine (PROZAC) 40 MG capsule Take 1 capsule (40 mg total) by mouth daily.   folic acid (FOLVITE) 1 MG tablet TAKE 1 TABLET BY MOUTH EVERY DAY   gabapentin (NEURONTIN) 600 MG tablet Take 1 tablet (600 mg total) by mouth at bedtime.   hydrOXYzine (ATARAX) 25 MG tablet TAKE 1 TABLET (25 MG TOTAL) BY MOUTH EVERY 8 (EIGHT) HOURS AS NEEDED. FOR ANXIETY   levETIRAcetam (KEPPRA) 500 MG tablet Take 1 tablet (500 mg total) by mouth 2 (two) times daily. Please keep upcoming appt for continued refills (Patient not taking: Reported on 08/28/2021)   omeprazole (PRILOSEC) 20 MG capsule Take 1 capsule (20 mg total) by mouth daily. heartburn   ondansetron (ZOFRAN ODT) 4 MG disintegrating tablet Take 1 tablet (4 mg total) by mouth every 8 (eight) hours as needed for nausea or vomiting.   QUEtiapine (SEROQUEL) 25 MG tablet Take 1 tablet (25 mg total) by mouth at bedtime.   tiZANidine (ZANAFLEX) 2 MG tablet TAKE 1 TABLET (2MG  TOTAL) BY MOUTH TWICE A DAY AS NEEDED FOR MUSCLE SPASM   traZODone (DESYREL) 150 MG tablet Take 0.5-1 tablets (75-150 mg total) by mouth at bedtime as needed for sleep.   No facility-administered encounter medications on file as of 10/03/2021.    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 She has reported that she drinks 2-3 cans of beer daily  Care Plan : LCSW Care Plan  Updates made by Betty Blakes, LCSW since 10/03/2021 12:00 AM     Problem: Depression Identification (Depression)      Goal: Depressive Symptoms Identified; Manage depression symptoms faced. Manage alcohol use weekly   Start Date: 07/09/2021  Expected End Date: 12/31/2021  This Visit's Progress: Not on track  Recent Progress: Not on track  Priority: Medium  Note:   Current Barriers:  Mental Health needs related to depression and depression management Challenges in Walking Alcohol use daily (2-3 beers per day) Some pain issues  Clinical Social Work Goal(s):  patient will work with SW monthly by telephone or in person to reduce or manage symptoms related to depression Patient will communicate with RNCM monthly as needed for CCM nursing support Patient will attend scheduled medical appointments in next 30 days Patient will communicate with SW monthly to discuss client use of alcohol  Interventions: 1:1 collaboration with Betty Ip, DO regarding development and update of comprehensive plan of care as evidenced by provider attestation and co-signature Discussed with Betty Nelson, daughter of client, the current needs of client Discussed with Betty Nelson client use of medications.  Betty Nelson said client has prescribed medications and is taking medications as prescribed Discussed with Betty Nelson hallucinations of client. Betty Nelson said she has not noticed any hallucinations recently with client Reviewed sleeping issues of client. Reviewed appetite of client. Betty Nelson said client  is eating small meals daily . Betty Nelson said client is drinking 1 can of Ensure daily. Betty Nelson said client weighs about 94 pounds Betty Nelson said she had ordered protein mix to help with client protein intake. She said protein mix was scheduled to arrive at client home in next few days.  Discussed with  Betty Nelson the Palliative Care nurse visit with client recently.  Betty Nelson said nurse visit went well. Betty Nelson also said she thought client may be mixing imagination or imaginative thoughts with reality.  LCSW talked with Betty Nelson about breaks from reality or times when a client may think something is real but is not actually real. LCSW informed Betty Nelson that Dr. Nadine Counts had placed psychiatry referral for [facility].  Betty Nelson said client was taking Prozac and Seroquel as prescribed. Discussed fact that client has daily alcohol intake of 2-3 beers daily.  LCSW asked Betty Nelson how client accessed beers weekly. Betty Nelson said she bought beers weekly for client.  This may be a means of strategic approach related to care support ; that is, if medical team could look at ways family may be enabling client to beer access. If family limits the amount of beer purchased weekly then client would not be drinking as many beers.  This could help client medically and also may help her appetite improve Discussed client mood. Betty Nelson said she thought that mood of client was stable.  Reviewed relaxation techniques of client (reads, listens to music, and enjoys crossword puzzles.) LCSW encouraged client or Betty Nelson to call RNCM as needed for CCM nursing support for client Discussed socialization of client. While client does socialize with her daughter and her spouse, client does not leave home often. She does  not walk outside often.  Thus client may be isolating socially from others outside of her family. She continues pattern of drinking a few beers daily though PCP has talked with her about negative affects on her body and on her appetite.   Reviewed ambulation of client. Client occasionally  uses a cane to help her walk  Patient Self Care Activities:  Completes ADLs Tries to attend scheduled medical appointments Takes medications as prescribed  Patient Coping Strengths:  Family Friends  Patient Self Care Deficits:  Mobility  issues Alcohol use Dementia  Patient Goals:  - spend time or talk with others at least 2 to 3 times per week - practice relaxation or meditation daily - keep a calendar with appointment dates  Follow Up Plan: LCSW to call client or Betty Nelson  on 11/07/21 at 3:00 PM      Betty Nelson MSW, LCSW Licensed Visual merchandiser [facility] Care Management [phone]

## 2021-10-05 DIAGNOSIS — I10 Essential (primary) hypertension: Secondary | ICD-10-CM

## 2021-10-05 DIAGNOSIS — F1027 Alcohol dependence with alcohol-induced persisting dementia: Secondary | ICD-10-CM

## 2021-10-05 DIAGNOSIS — F331 Major depressive disorder, recurrent, moderate: Secondary | ICD-10-CM

## 2021-10-12 ENCOUNTER — Other Ambulatory Visit: Payer: Self-pay | Admitting: Family Medicine

## 2021-10-15 NOTE — Telephone Encounter (Signed)
Last office visit 08/28/21 Last refill 05/04/21, #90, 1 refill

## 2021-10-22 ENCOUNTER — Ambulatory Visit: Payer: Self-pay | Admitting: *Deleted

## 2021-10-22 DIAGNOSIS — F1027 Alcohol dependence with alcohol-induced persisting dementia: Secondary | ICD-10-CM

## 2021-10-22 DIAGNOSIS — R627 Adult failure to thrive: Secondary | ICD-10-CM

## 2021-10-22 NOTE — Chronic Care Management (AMB) (Signed)
  Chronic Care Management   Note  10/22/2021 Name: Betty Nelson MRN: [id number] DOB: 28-Nov-1960   Patient has either met RN Care Management goals, is stable from [place] Management perspective, or has not recently engaged with the RN Care Manager. I am removing RN Care Manager from Care Team and closing [place]. If patient is currently engaged with another CCM team member I will forward this encounter to inform them of my case closure. Patient may be eligible for re-engagement with RN Care Manager in the future if necessary and can discuss this with their PCP.  Chong Sicilian, BSN, RN-BC Embedded Chronic Care Manager [facility] [place] Management Direct Dial: [phone]

## 2021-10-22 NOTE — Patient Instructions (Signed)
Peterson Ao Daye  I have previously worked with you through the Chronic Care Management Program at [facility]. Due to program changes I am removing myself from your care team because you've either met our goals, your conditions are stable and no longer require care management, or we haven't engaged within the past 6 months. If you are currently active with another CCM Team Member, you will remain active with them unless they reach out to you with additional information. If you feel that you need RN Care Management services in the future, please talk with your primary care provider to discuss re-engagement with the RN Care Manager that will be assigned to [facility]. This does not affect your status as a patient at [facility].   Thank you for allowing me to participate in your your healthcare journey.  Chong Sicilian, BSN, RN-BC Embedded Chronic Care Manager [facility] / [place] Management Direct Dial: [phone]

## 2021-10-23 ENCOUNTER — Ambulatory Visit: Payer: Medicare HMO | Admitting: Licensed Clinical Social Worker

## 2021-10-23 DIAGNOSIS — F419 Anxiety disorder, unspecified: Secondary | ICD-10-CM

## 2021-10-23 DIAGNOSIS — M5136 Other intervertebral disc degeneration, lumbar region: Secondary | ICD-10-CM

## 2021-10-23 DIAGNOSIS — F1027 Alcohol dependence with alcohol-induced persisting dementia: Secondary | ICD-10-CM

## 2021-10-23 DIAGNOSIS — F1094 Alcohol use, unspecified with alcohol-induced mood disorder: Secondary | ICD-10-CM

## 2021-10-23 DIAGNOSIS — R569 Unspecified convulsions: Secondary | ICD-10-CM

## 2021-10-23 DIAGNOSIS — F331 Major depressive disorder, recurrent, moderate: Secondary | ICD-10-CM

## 2021-10-23 DIAGNOSIS — F1020 Alcohol dependence, uncomplicated: Secondary | ICD-10-CM

## 2021-10-23 DIAGNOSIS — I10 Essential (primary) hypertension: Secondary | ICD-10-CM

## 2021-10-23 DIAGNOSIS — K219 Gastro-esophageal reflux disease without esophagitis: Secondary | ICD-10-CM

## 2021-10-23 NOTE — Patient Instructions (Signed)
Visit Information  Thank you for taking time to visit with me today. Please don't hesitate to contact me if I can be of assistance to you before our next scheduled telephone appointment.  Following are the goals we discussed today:   Our next appointment is by telephone and will be scheduled by Care Guide Scheduler.   Please call the care guide team at [phone] if you need to cancel or reschedule your appointment.   If you are experiencing a Mental Health or Behavioral Health Crisis or need someone to talk to, please call the [facility]: [phone]   Following is a copy of your full plan of care:  Care Plan : LCSW Care Plan  Updates made by Isaiah Blakes, LCSW since 10/23/2021 12:00 AM     Problem: Depression Identification (Depression)      Goal: Depressive Symptoms Identified; Manage depression symptoms faced. Manage alcohol use weekly   Start Date: 07/09/2021  Expected End Date: 12/31/2021  This Visit's Progress: Not on track  Recent Progress: Not on track  Priority: Medium  Note:   Current Barriers:  Mental Health needs related to depression and depression management Challenges in Walking Alcohol use daily (2-3 beers per day) Some pain issues  Clinical Social Work Goal(s):  patient will work with SW monthly by telephone or in person to reduce or manage symptoms related to depression Patient will attend scheduled medical appointments in next 30 days Patient will communicate with SW monthly to discuss client use of alcohol  Interventions: 1:1 collaboration with [name], DO regarding development and update of comprehensive plan of care as evidenced by provider attestation and co-signature Discussed with [name] Cal[place], daughter of client, the current needs of client Discussed with [name] client use of medications.  [name] said client has prescribed medications and is taking medications as prescribed Discussed with [name] hallucinations  of client. [name] said she has not noticed any hallucinations recently with client Reviewed sleeping issues of client. Reviewed appetite of client. [name] said client  is eating small meals daily . Discussed psychiatry referral for client . [name] said client has been contacted by psychiatry office and client and family are trying to set up time and date for client psychiatry visit. Discussed client mood. [name] said she thought that mood of client was stable.  Reviewed ambulation of client. Client occasionally  uses a cane to help her walk Informed [name] that client would be discharged today from CCM services and would start on Care Management services moving forward. [name] agreed to plan and will inform client of information  Patient Self Care Activities:  Completes ADLs Tries to attend scheduled medical appointments Takes medications as prescribed  Patient Coping Strengths:  Family Friends  Patient Self Care Deficits:  Mobility issues Alcohol use Dementia  Patient Goals:  - spend time or talk with others at least 2 to 3 times per week - practice relaxation or meditation daily - keep a calendar with appointment dates  Follow Up Plan: Care Guide to schedule Care Management phone visit of LCSW with client.     Ms. Shasteen was given information about Care Management services by the embedded care coordination team including:  Care Management services include personalized support from designated clinical staff supervised by her physician, including individualized plan of care and coordination with other care providers 24/7 contact phone numbers for assistance for urgent and routine care needs. The patient may stop CCM services at any time (effective at the end of  the month) by phone call to the office staff.  Patient agreed to services and verbal consent obtained.   Kelton Pillar.Harjas Biggins MSW, LCSW Licensed Visual merchandiser [facility] Care Management [phone]

## 2021-10-23 NOTE — Chronic Care Management (AMB) (Signed)
Care Management Clinical Social Work Note  10/23/2021 Name: Betty Nelson MRN: [id number] DOB: 06-19-1960  Betty Nelson is a 61 y.o. year old female who is a primary care patient of [name], DO.  The Care Management team was consulted for assistance with chronic disease management and coordination needs.  Engaged with patient / daughter of patient,  jillyn, stacey telephone for follow up visit in response to provider referral for social work chronic care management and care coordination services  Consent to Services:  Ms. Raulerson was given information about Care Management services today including:  Care Management services includes personalized support from designated clinical staff supervised by her physician, including individualized plan of care and coordination with other care providers 24/7 contact phone numbers for assistance for urgent and routine care needs. The patient may stop case management services at any time by phone call to the office staff.  Patient agreed to services and consent obtained.   Assessment: Review of patient past medical history, allergies, medications, and health status, including review of relevant consultants reports was performed today as part of a comprehensive evaluation and provision of chronic care management and care coordination services.  SDOH (Social Determinants of Health) assessments and interventions performed:  SDOH Interventions    Flowsheet Row Most Recent Value  SDOH Interventions   Physical Activity Interventions Other (Comments)  [client uses a cane to help her walk]  Stress Interventions Other (Comment)  [client has stress related to managing medical needs.]  Depression Interventions/Treatment  Currently on Treatment        Advanced Directives Status: See Vynca application for related entries.  Care Plan  No Known Allergies  Outpatient Encounter Medications as of 10/23/2021  Medication Sig   traZODone (DESYREL)  150 MG tablet TAKE 0.5-1 TABLET (75-150 MG TOTAL) BY MOUTH AT BEDTIME AS NEEDED FOR SLEEP.   feeding supplement, ENSURE COMPLETE, (ENSURE COMPLETE) LIQD Take 237 mLs by mouth 2 (two) times daily between meals.   fexofenadine (ALLEGRA) 180 MG tablet TAKE 1 TABLET BY MOUTH EVERY DAY   FLUoxetine (PROZAC) 40 MG capsule Take 1 capsule (40 mg total) by mouth daily.   folic acid (FOLVITE) 1 MG tablet TAKE 1 TABLET BY MOUTH EVERY DAY   gabapentin (NEURONTIN) 600 MG tablet Take 1 tablet (600 mg total) by mouth at bedtime.   hydrOXYzine (ATARAX) 25 MG tablet TAKE 1 TABLET (25 MG TOTAL) BY MOUTH EVERY 8 (EIGHT) HOURS AS NEEDED. FOR ANXIETY   levETIRAcetam (KEPPRA) 500 MG tablet Take 1 tablet (500 mg total) by mouth 2 (two) times daily. Please keep upcoming appt for continued refills (Patient not taking: Reported on 08/28/2021)   omeprazole (PRILOSEC) 20 MG capsule Take 1 capsule (20 mg total) by mouth daily. heartburn   ondansetron (ZOFRAN ODT) 4 MG disintegrating tablet Take 1 tablet (4 mg total) by mouth every 8 (eight) hours as needed for nausea or vomiting.   QUEtiapine (SEROQUEL) 25 MG tablet Take 1 tablet (25 mg total) by mouth at bedtime.   tiZANidine (ZANAFLEX) 2 MG tablet TAKE 1 TABLET (2MG  TOTAL) BY MOUTH TWICE A DAY AS NEEDED FOR MUSCLE SPASM   No facility-administered encounter medications on file as of 10/23/2021.    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) 07/23/2019   Alcoholism with psychosis, with hallucinations (HCC) 07/23/2019   Wernicke's encephalopathy  with cerebellar manifestation 07/23/2019   Seizures (HCC) 07/23/2019   Todd's paralysis (HCC) 10/05/2017   Alcohol abuse 10/05/2017   Encephalopathy    Allergic rhinitis 05/20/2017   Primary insomnia 11/18/2016   DDD (degenerative disc disease), lumbar  05/20/2016   Moderate episode of recurrent major depressive disorder (HCC) 05/20/2016   Essential hypertension 05/20/2016    Conditions to be addressed/monitored: monitor client management of depression issues  Care Plan : LCSW Care Plan  Updates made by Isaiah Blakes, LCSW since 10/23/2021 12:00 AM     Problem: Depression Identification (Depression)      Goal: Depressive Symptoms Identified; Manage depression symptoms faced. Manage alcohol use weekly   Start Date: 07/09/2021  Expected End Date: 12/31/2021  This Visit's Progress: Not on track  Recent Progress: Not on track  Priority: Medium  Note:   Current Barriers:  Mental Health needs related to depression and depression management Challenges in Walking Alcohol use daily (2-3 beers per day) Some pain issues  Clinical Social Work Goal(s):  patient will work with SW monthly by telephone or in person to reduce or manage symptoms related to depression Patient will attend scheduled medical appointments in next 30 days Patient will communicate with SW monthly to discuss client use of alcohol  Interventions: 1:1 collaboration with [name], DO regarding development and update of comprehensive plan of care as evidenced by provider attestation and co-signature Discussed with [name] Bolen, daughter of client, the current needs of client Discussed with [name] client use of medications.  [name] said client has prescribed medications and is taking medications as prescribed Discussed with [name] hallucinations of client. [name] said she has not noticed any hallucinations recently with client Reviewed sleeping issues of client. Reviewed appetite of client. [name] said client  is eating small meals daily . Discussed psychiatry referral for client . [name] said client has been contacted by psychiatry office and client and family are trying to set up time and date for client psychiatry visit. Discussed client mood. [name] said  she thought that mood of client was stable.  Reviewed ambulation of client. Client occasionally  uses a cane to help her walk Informed [name] that client would be discharged today from CCM services and would start on Care Management services moving forward. [name] agreed to plan and will inform client of information  Patient Self Care Activities:  Completes ADLs Tries to attend scheduled medical appointments Takes medications as prescribed  Patient Coping Strengths:  Family Friends  Patient Self Care Deficits:  Mobility issues Alcohol use Dementia  Patient Goals:  - spend time or talk with others at least 2 to 3 times per week - practice relaxation or meditation daily - keep a calendar with appointment dates  Follow Up Plan: Care Guide to schedule Care Management phone visit of LCSW with client.      Kelton Pillar.Loel Betancur MSW, LCSW Licensed Visual merchandiser [facility] Care Management [phone]

## 2021-10-30 ENCOUNTER — Telehealth: Payer: Self-pay

## 2021-10-30 NOTE — Chronic Care Management (AMB) (Signed)
  Care Management   Outreach Note  10/30/2021 Name: Betty Nelson MRN: [id number] DOB: 11-08-1960  An unsuccessful telephone outreach was attempted today. The patient was referred to the case management team for assistance with care management and care coordination.   Follow Up Plan:  A HIPAA compliant phone message was left for the patient providing contact information and requesting a return call.  The care management team will reach out to the patient again over the next 7 days.  If patient returns call to provider office, please advise to call  Care Management Care Guide Penne Lash * at [phone] Lash, RMA Care Guide [facility]  [place], [place] [address] Direct Dial: [phone] [email or web address]

## 2021-11-07 ENCOUNTER — Telehealth: Payer: Medicare HMO

## 2021-11-08 NOTE — Chronic Care Management (AMB) (Signed)
  Care Management   Outreach Note  11/08/2021 Name: Betty Nelson MRN: [id number] DOB: 1961-02-10  A second unsuccessful telephone outreach was attempted today. The patient was referred to the case management team for assistance with care management and care coordination.   Follow Up Plan:  A HIPAA compliant phone message was left for the patient providing contact information and requesting a return call.  The care management team will reach out to the patient again over the next 7 days.  If patient returns call to provider office, please advise to call Care Management Care Guide Penne Lash * at [phone]*  Penne Lash, RMA Care Guide [facility]  [place], [place] [address] Direct Dial: [phone] [email or web address]

## 2021-11-09 NOTE — Chronic Care Management (AMB) (Signed)
  Care Coordination Note  11/09/2021 Name: [name] MRN: [id number] DOB: 10/24/1960  Betty Nelson is a 61 y.o. year old female who is a primary care patient of [name], DO and is actively engaged with the care management team. I reached out to Camillia Herter Mehringer by phone today to assist with scheduling a follow up visit with the Licensed Clinical Social Worker  Follow up plan: Unable to make contact on outreach attempts x 2. PCP [name], DO notified via routed documentation in medical record.   Penne Lash, RMA Care Guide [facility] [place], [place] [address] Direct Dial: [phone] [email or web address]

## 2021-11-21 DIAGNOSIS — Z515 Encounter for palliative care: Secondary | ICD-10-CM | POA: Diagnosis not present

## 2021-11-21 DIAGNOSIS — E43 Unspecified severe protein-calorie malnutrition: Secondary | ICD-10-CM | POA: Diagnosis not present

## 2021-11-21 DIAGNOSIS — Z681 Body mass index (BMI) 19 or less, adult: Secondary | ICD-10-CM | POA: Diagnosis not present

## 2021-11-21 DIAGNOSIS — R69 Illness, unspecified: Secondary | ICD-10-CM | POA: Diagnosis not present

## 2021-12-14 ENCOUNTER — Ambulatory Visit: Payer: Medicare HMO | Admitting: Family Medicine

## 2021-12-25 ENCOUNTER — Other Ambulatory Visit: Payer: Self-pay | Admitting: Family Medicine

## 2021-12-25 DIAGNOSIS — M5136 Other intervertebral disc degeneration, lumbar region: Secondary | ICD-10-CM

## 2021-12-25 DIAGNOSIS — F419 Anxiety disorder, unspecified: Secondary | ICD-10-CM

## 2021-12-25 DIAGNOSIS — K219 Gastro-esophageal reflux disease without esophagitis: Secondary | ICD-10-CM

## 2022-01-07 ENCOUNTER — Ambulatory Visit: Payer: Medicare HMO | Admitting: Family Medicine

## 2022-01-31 DIAGNOSIS — Z515 Encounter for palliative care: Secondary | ICD-10-CM | POA: Diagnosis not present

## 2022-01-31 DIAGNOSIS — R69 Illness, unspecified: Secondary | ICD-10-CM | POA: Diagnosis not present

## 2022-01-31 DIAGNOSIS — R630 Anorexia: Secondary | ICD-10-CM | POA: Diagnosis not present

## 2022-01-31 DIAGNOSIS — Z681 Body mass index (BMI) 19 or less, adult: Secondary | ICD-10-CM | POA: Diagnosis not present

## 2022-01-31 DIAGNOSIS — R64 Cachexia: Secondary | ICD-10-CM | POA: Diagnosis not present

## 2022-01-31 DIAGNOSIS — E43 Unspecified severe protein-calorie malnutrition: Secondary | ICD-10-CM | POA: Diagnosis not present

## 2022-01-31 DIAGNOSIS — M6250 Muscle wasting and atrophy, not elsewhere classified, unspecified site: Secondary | ICD-10-CM | POA: Diagnosis not present

## 2022-02-05 ENCOUNTER — Other Ambulatory Visit: Payer: Self-pay | Admitting: Family Medicine

## 2022-02-05 DIAGNOSIS — M5136 Other intervertebral disc degeneration, lumbar region: Secondary | ICD-10-CM

## 2022-02-07 DIAGNOSIS — Y900 Blood alcohol level of less than 20 mg/100 ml: Secondary | ICD-10-CM | POA: Diagnosis not present

## 2022-02-07 DIAGNOSIS — F17200 Nicotine dependence, unspecified, uncomplicated: Secondary | ICD-10-CM | POA: Diagnosis not present

## 2022-02-07 DIAGNOSIS — R69 Illness, unspecified: Secondary | ICD-10-CM | POA: Diagnosis not present

## 2022-02-07 DIAGNOSIS — R5381 Other malaise: Secondary | ICD-10-CM | POA: Diagnosis not present

## 2022-02-07 DIAGNOSIS — I10 Essential (primary) hypertension: Secondary | ICD-10-CM | POA: Diagnosis not present

## 2022-02-07 DIAGNOSIS — Z8673 Personal history of transient ischemic attack (TIA), and cerebral infarction without residual deficits: Secondary | ICD-10-CM | POA: Diagnosis not present

## 2022-02-07 DIAGNOSIS — R404 Transient alteration of awareness: Secondary | ICD-10-CM | POA: Diagnosis not present

## 2022-02-25 ENCOUNTER — Inpatient Hospital Stay: Payer: Medicare HMO | Admitting: Family Medicine

## 2022-03-06 ENCOUNTER — Ambulatory Visit: Payer: Medicare HMO | Admitting: Family Medicine

## 2022-03-22 ENCOUNTER — Other Ambulatory Visit: Payer: Self-pay | Admitting: Family Medicine

## 2022-03-22 DIAGNOSIS — M5136 Other intervertebral disc degeneration, lumbar region: Secondary | ICD-10-CM

## 2022-03-22 MED ORDER — TIZANIDINE HCL 2 MG PO TABS: ORAL_TABLET | ORAL | 0 refills | Status: DC

## 2022-03-22 NOTE — Telephone Encounter (Signed)
  Prescription Request  03/22/2022  What is the name of the medication or equipment? TIZANIDINE 2MG   Have you contacted your pharmacy to request a refill? YES  Which pharmacy would you like this sent to? [facility]   Nurse called stating that pt needs refill on this medicine.

## 2022-03-29 ENCOUNTER — Other Ambulatory Visit: Payer: Self-pay | Admitting: Family Medicine

## 2022-03-29 DIAGNOSIS — K219 Gastro-esophageal reflux disease without esophagitis: Secondary | ICD-10-CM

## 2022-03-29 DIAGNOSIS — F419 Anxiety disorder, unspecified: Secondary | ICD-10-CM

## 2022-03-29 NOTE — Telephone Encounter (Signed)
Last OV 08/2021. Next OV not scheduled and pt has cancelled last few appts

## 2022-03-31 ENCOUNTER — Other Ambulatory Visit: Payer: Self-pay | Admitting: Family Medicine

## 2022-05-03 ENCOUNTER — Other Ambulatory Visit: Payer: Self-pay | Admitting: Family Medicine

## 2022-05-03 DIAGNOSIS — F1020 Alcohol dependence, uncomplicated: Secondary | ICD-10-CM

## 2022-05-16 ENCOUNTER — Other Ambulatory Visit: Payer: Self-pay | Admitting: Family Medicine

## 2022-05-16 DIAGNOSIS — F1020 Alcohol dependence, uncomplicated: Secondary | ICD-10-CM

## 2022-05-17 NOTE — Telephone Encounter (Signed)
[place] NTBS 30 days given 05/03/22

## 2022-05-17 NOTE — Telephone Encounter (Signed)
Spoke to pts daughter states that she is in hospice care and gets medications through [facility], had some medications on auto fill at [facility] and she had forgot to cancel. She does not need med refilled or appt

## 2022-05-21 ENCOUNTER — Other Ambulatory Visit: Payer: Self-pay | Admitting: Family Medicine

## 2022-05-21 DIAGNOSIS — F331 Major depressive disorder, recurrent, moderate: Secondary | ICD-10-CM

## 2022-06-04 ENCOUNTER — Other Ambulatory Visit: Payer: Self-pay | Admitting: Family Medicine

## 2022-06-04 ENCOUNTER — Encounter: Payer: Self-pay | Admitting: Family Medicine

## 2022-06-04 DIAGNOSIS — F331 Major depressive disorder, recurrent, moderate: Secondary | ICD-10-CM

## 2022-06-04 NOTE — Telephone Encounter (Signed)
[place] NTBS 30 days given 05/21/22

## 2022-06-04 NOTE — Telephone Encounter (Signed)
I called pt & LMTCB to make an appt w/Dr Lajuana Ripple to get more refills on her meds.

## 2022-08-11 DIAGNOSIS — G8929 Other chronic pain: Secondary | ICD-10-CM | POA: Diagnosis not present

## 2022-08-11 DIAGNOSIS — F17290 Nicotine dependence, other tobacco product, uncomplicated: Secondary | ICD-10-CM | POA: Diagnosis not present

## 2022-08-11 DIAGNOSIS — Z79899 Other long term (current) drug therapy: Secondary | ICD-10-CM | POA: Diagnosis not present

## 2022-08-11 DIAGNOSIS — I10 Essential (primary) hypertension: Secondary | ICD-10-CM | POA: Diagnosis not present

## 2022-08-11 DIAGNOSIS — M549 Dorsalgia, unspecified: Secondary | ICD-10-CM | POA: Diagnosis not present

## 2022-08-11 DIAGNOSIS — N3000 Acute cystitis without hematuria: Secondary | ICD-10-CM | POA: Diagnosis not present

## 2022-08-11 DIAGNOSIS — I700 Atherosclerosis of aorta: Secondary | ICD-10-CM | POA: Diagnosis not present

## 2022-08-11 DIAGNOSIS — F1097 Alcohol use, unspecified with alcohol-induced persisting dementia: Secondary | ICD-10-CM | POA: Diagnosis not present

## 2022-08-11 DIAGNOSIS — R109 Unspecified abdominal pain: Secondary | ICD-10-CM | POA: Diagnosis not present

## 2022-08-11 DIAGNOSIS — Z8673 Personal history of transient ischemic attack (TIA), and cerebral infarction without residual deficits: Secondary | ICD-10-CM | POA: Diagnosis not present

## 2022-11-15 DIAGNOSIS — I700 Atherosclerosis of aorta: Secondary | ICD-10-CM | POA: Diagnosis not present

## 2022-11-15 DIAGNOSIS — Z8673 Personal history of transient ischemic attack (TIA), and cerebral infarction without residual deficits: Secondary | ICD-10-CM | POA: Diagnosis not present

## 2022-11-15 DIAGNOSIS — G4700 Insomnia, unspecified: Secondary | ICD-10-CM | POA: Diagnosis not present

## 2022-11-15 DIAGNOSIS — R4182 Altered mental status, unspecified: Secondary | ICD-10-CM | POA: Diagnosis not present

## 2023-07-16 ENCOUNTER — Other Ambulatory Visit: Payer: Self-pay | Admitting: Family Medicine

## 2023-07-16 NOTE — Telephone Encounter (Signed)
 Copied from CRM [id number]. Topic: Clinical - Medication Refill >> Jul 16, 2023 12:20 PM Elle L wrote: Most Recent Primary Care Visit:   Medication: The patient's hospice nurse called requesting a refill of Oxycodone 15 MG take one every 3 hours PRN. However, I did not see it listed on the patient's chart.   Has the patient contacted their pharmacy? Yes  Is this the correct pharmacy for this prescription? Yes If no, delete pharmacy and type the correct one.  This is the patient's preferred pharmacy:   [facility] - [place], [place] - [address] [place] [address] Phone: [phone] Fax: [phone]  Has the prescription been filled recently? No  Is the patient out of the medication? Yes, 1 pill left.   Has the patient been seen for an appointment in the last year OR does the patient have an upcoming appointment? No  Can we respond through MyChart? No  Agent: Please be advised that Rx refills may take up to 3 business days. We ask that you follow-up with your pharmacy.

## 2023-11-07 DEATH — deceased
